# Patient Record
Sex: Male | Born: 1935 | Race: White | Hispanic: No | Marital: Married | Smoking: Former smoker
Health system: Southern US, Community
[De-identification: ages and names within clinical notes are randomized; demographics above are authoritative.]

## PROBLEM LIST (undated history)

## (undated) DIAGNOSIS — I10 Essential (primary) hypertension: Secondary | ICD-10-CM

## (undated) DIAGNOSIS — Z5189 Encounter for other specified aftercare: Secondary | ICD-10-CM

## (undated) DIAGNOSIS — I2510 Atherosclerotic heart disease of native coronary artery without angina pectoris: Secondary | ICD-10-CM

## (undated) DIAGNOSIS — E785 Hyperlipidemia, unspecified: Secondary | ICD-10-CM

## (undated) DIAGNOSIS — K219 Gastro-esophageal reflux disease without esophagitis: Secondary | ICD-10-CM

## (undated) DIAGNOSIS — T39395A Adverse effect of other nonsteroidal anti-inflammatory drugs [NSAID], initial encounter: Secondary | ICD-10-CM

## (undated) DIAGNOSIS — G500 Trigeminal neuralgia: Secondary | ICD-10-CM

## (undated) DIAGNOSIS — I4891 Unspecified atrial fibrillation: Secondary | ICD-10-CM

## (undated) DIAGNOSIS — K259 Gastric ulcer, unspecified as acute or chronic, without hemorrhage or perforation: Secondary | ICD-10-CM

## (undated) DIAGNOSIS — I509 Heart failure, unspecified: Secondary | ICD-10-CM

## (undated) DIAGNOSIS — K922 Gastrointestinal hemorrhage, unspecified: Secondary | ICD-10-CM

## (undated) HISTORY — DX: Heart failure, unspecified: I50.9

## (undated) HISTORY — DX: Trigeminal neuralgia: G50.0

## (undated) HISTORY — DX: Adverse effect of other nonsteroidal anti-inflammatory drugs (NSAID), initial encounter: T39.395A

## (undated) HISTORY — DX: Atherosclerotic heart disease of native coronary artery without angina pectoris: I25.10

## (undated) HISTORY — PX: CHOLECYSTECTOMY: SHX55

## (undated) HISTORY — PX: SHOULDER SURGERY: SHX246

## (undated) HISTORY — DX: Gastro-esophageal reflux disease without esophagitis: K21.9

## (undated) HISTORY — DX: Hyperlipidemia, unspecified: E78.5

## (undated) HISTORY — DX: Unspecified atrial fibrillation: I48.91

## (undated) HISTORY — DX: Essential (primary) hypertension: I10

## (undated) HISTORY — DX: Gastric ulcer, unspecified as acute or chronic, without hemorrhage or perforation: K25.9

---

## 1998-05-23 ENCOUNTER — Observation Stay (HOSPITAL_COMMUNITY): Admission: RE | Admit: 1998-05-23 | Discharge: 1998-05-24 | Payer: Self-pay | Admitting: *Deleted

## 2004-08-22 ENCOUNTER — Ambulatory Visit (HOSPITAL_COMMUNITY): Admission: RE | Admit: 2004-08-22 | Discharge: 2004-08-22 | Payer: Self-pay | Admitting: Orthopedic Surgery

## 2004-08-22 ENCOUNTER — Ambulatory Visit (HOSPITAL_BASED_OUTPATIENT_CLINIC_OR_DEPARTMENT_OTHER): Admission: RE | Admit: 2004-08-22 | Discharge: 2004-08-22 | Payer: Self-pay | Admitting: Orthopedic Surgery

## 2004-08-22 ENCOUNTER — Encounter: Admission: RE | Admit: 2004-08-22 | Discharge: 2004-08-22 | Payer: Self-pay | Admitting: Orthopedic Surgery

## 2006-03-13 ENCOUNTER — Ambulatory Visit: Payer: Self-pay | Admitting: Internal Medicine

## 2006-05-05 ENCOUNTER — Ambulatory Visit: Payer: Self-pay | Admitting: Internal Medicine

## 2006-06-10 ENCOUNTER — Ambulatory Visit: Payer: Self-pay | Admitting: Internal Medicine

## 2006-07-21 ENCOUNTER — Ambulatory Visit: Payer: Self-pay | Admitting: Internal Medicine

## 2006-10-28 ENCOUNTER — Ambulatory Visit: Payer: Self-pay | Admitting: Internal Medicine

## 2007-02-13 ENCOUNTER — Encounter: Admission: RE | Admit: 2007-02-13 | Discharge: 2007-02-13 | Payer: Self-pay | Admitting: Orthopedic Surgery

## 2007-02-13 ENCOUNTER — Ambulatory Visit: Payer: Self-pay | Admitting: Internal Medicine

## 2007-02-13 LAB — CONVERTED CEMR LAB
ALT: 18 units/L (ref 0–40)
AST: 27 units/L (ref 0–37)
Albumin: 3.6 g/dL (ref 3.5–5.2)
Alkaline Phosphatase: 77 units/L (ref 39–117)
BUN: 14 mg/dL (ref 6–23)
Basophils Absolute: 0 10*3/uL (ref 0.0–0.1)
Basophils Relative: 0.4 % (ref 0.0–1.0)
Bilirubin, Direct: 0.2 mg/dL (ref 0.0–0.3)
CO2: 37 meq/L — ABNORMAL HIGH (ref 19–32)
Calcium: 8.3 mg/dL — ABNORMAL LOW (ref 8.4–10.5)
Chloride: 104 meq/L (ref 96–112)
Creatinine, Ser: 1 mg/dL (ref 0.4–1.5)
Eosinophils Absolute: 0.2 10*3/uL (ref 0.0–0.6)
Eosinophils Relative: 2 % (ref 0.0–5.0)
GFR calc Af Amer: 94 mL/min
GFR calc non Af Amer: 78 mL/min
Glucose, Bld: 131 mg/dL — ABNORMAL HIGH (ref 70–99)
HCT: 41.6 % (ref 39.0–52.0)
Hemoglobin: 14.2 g/dL (ref 13.0–17.0)
Lymphocytes Relative: 19.6 % (ref 12.0–46.0)
MCHC: 34.2 g/dL (ref 30.0–36.0)
MCV: 95.2 fL (ref 78.0–100.0)
Monocytes Absolute: 1.1 10*3/uL — ABNORMAL HIGH (ref 0.2–0.7)
Monocytes Relative: 11.8 % — ABNORMAL HIGH (ref 3.0–11.0)
Neutro Abs: 6.4 10*3/uL (ref 1.4–7.7)
Neutrophils Relative %: 66.2 % (ref 43.0–77.0)
PSA: 2.21 ng/mL (ref 0.10–4.00)
Platelets: 177 10*3/uL (ref 150–400)
Potassium: 3.5 meq/L (ref 3.5–5.1)
RBC: 4.37 M/uL (ref 4.22–5.81)
RDW: 13.2 % (ref 11.5–14.6)
Sodium: 146 meq/L — ABNORMAL HIGH (ref 135–145)
Total Bilirubin: 1.4 mg/dL — ABNORMAL HIGH (ref 0.3–1.2)
Total Protein: 6.3 g/dL (ref 6.0–8.3)
WBC: 9.6 10*3/uL (ref 4.5–10.5)

## 2007-02-26 ENCOUNTER — Encounter: Payer: Self-pay | Admitting: Internal Medicine

## 2007-04-24 HISTORY — PX: HIP SURGERY: SHX245

## 2007-05-11 ENCOUNTER — Inpatient Hospital Stay (HOSPITAL_COMMUNITY): Admission: RE | Admit: 2007-05-11 | Discharge: 2007-05-16 | Payer: Self-pay | Admitting: Orthopedic Surgery

## 2007-05-12 ENCOUNTER — Telehealth: Payer: Self-pay | Admitting: Internal Medicine

## 2007-05-21 ENCOUNTER — Ambulatory Visit: Payer: Self-pay | Admitting: Internal Medicine

## 2007-07-16 ENCOUNTER — Ambulatory Visit: Payer: Self-pay | Admitting: Internal Medicine

## 2007-07-21 DIAGNOSIS — I119 Hypertensive heart disease without heart failure: Secondary | ICD-10-CM

## 2007-07-21 DIAGNOSIS — I10 Essential (primary) hypertension: Secondary | ICD-10-CM

## 2007-07-21 DIAGNOSIS — Z8679 Personal history of other diseases of the circulatory system: Secondary | ICD-10-CM

## 2007-07-21 DIAGNOSIS — K219 Gastro-esophageal reflux disease without esophagitis: Secondary | ICD-10-CM

## 2007-07-21 HISTORY — DX: Hypertensive heart disease without heart failure: I11.9

## 2007-07-21 HISTORY — DX: Essential (primary) hypertension: I10

## 2007-07-21 HISTORY — DX: Personal history of other diseases of the circulatory system: Z86.79

## 2007-07-21 HISTORY — DX: Gastro-esophageal reflux disease without esophagitis: K21.9

## 2007-07-23 ENCOUNTER — Ambulatory Visit: Payer: Self-pay | Admitting: Internal Medicine

## 2007-07-23 DIAGNOSIS — I482 Chronic atrial fibrillation, unspecified: Secondary | ICD-10-CM

## 2007-07-23 HISTORY — DX: Chronic atrial fibrillation, unspecified: I48.20

## 2007-10-01 ENCOUNTER — Encounter: Payer: Self-pay | Admitting: Internal Medicine

## 2007-10-09 ENCOUNTER — Telehealth: Payer: Self-pay | Admitting: Internal Medicine

## 2007-10-20 ENCOUNTER — Ambulatory Visit: Payer: Self-pay | Admitting: Internal Medicine

## 2007-10-20 ENCOUNTER — Encounter: Payer: Self-pay | Admitting: Internal Medicine

## 2007-10-23 ENCOUNTER — Ambulatory Visit: Payer: Self-pay | Admitting: Family Medicine

## 2007-10-23 DIAGNOSIS — I2510 Atherosclerotic heart disease of native coronary artery without angina pectoris: Secondary | ICD-10-CM

## 2007-10-23 HISTORY — DX: Atherosclerotic heart disease of native coronary artery without angina pectoris: I25.10

## 2007-11-05 ENCOUNTER — Encounter: Payer: Self-pay | Admitting: Internal Medicine

## 2007-11-13 ENCOUNTER — Telehealth: Payer: Self-pay | Admitting: Internal Medicine

## 2007-12-29 ENCOUNTER — Ambulatory Visit: Payer: Self-pay | Admitting: Internal Medicine

## 2008-02-08 ENCOUNTER — Telehealth: Payer: Self-pay | Admitting: Internal Medicine

## 2008-02-22 HISTORY — PX: TOTAL KNEE ARTHROPLASTY: SHX125

## 2008-03-10 ENCOUNTER — Telehealth: Payer: Self-pay | Admitting: Internal Medicine

## 2008-03-14 ENCOUNTER — Inpatient Hospital Stay (HOSPITAL_COMMUNITY): Admission: RE | Admit: 2008-03-14 | Discharge: 2008-03-17 | Payer: Self-pay | Admitting: Orthopedic Surgery

## 2008-03-14 ENCOUNTER — Ambulatory Visit: Payer: Self-pay | Admitting: Internal Medicine

## 2008-04-13 ENCOUNTER — Telehealth (INDEPENDENT_AMBULATORY_CARE_PROVIDER_SITE_OTHER): Payer: Self-pay

## 2008-04-22 ENCOUNTER — Ambulatory Visit: Payer: Self-pay | Admitting: Internal Medicine

## 2008-04-26 ENCOUNTER — Ambulatory Visit: Payer: Self-pay | Admitting: Internal Medicine

## 2008-07-05 ENCOUNTER — Ambulatory Visit: Payer: Self-pay | Admitting: Internal Medicine

## 2008-08-17 ENCOUNTER — Telehealth: Payer: Self-pay | Admitting: Internal Medicine

## 2008-09-02 ENCOUNTER — Telehealth: Payer: Self-pay | Admitting: Internal Medicine

## 2008-11-01 IMAGING — CR DG CHEST 2V
2 series · 2 of 2 positions shown · non-contrast
Comparison: 08/22/04.

CLINICAL DATA: Osteoarthritis of right hip.  Preoperative chest for total hip replacement.
 CHEST - 2 VIEW - 05/05/07:

[view not recorded (1 of 2)]
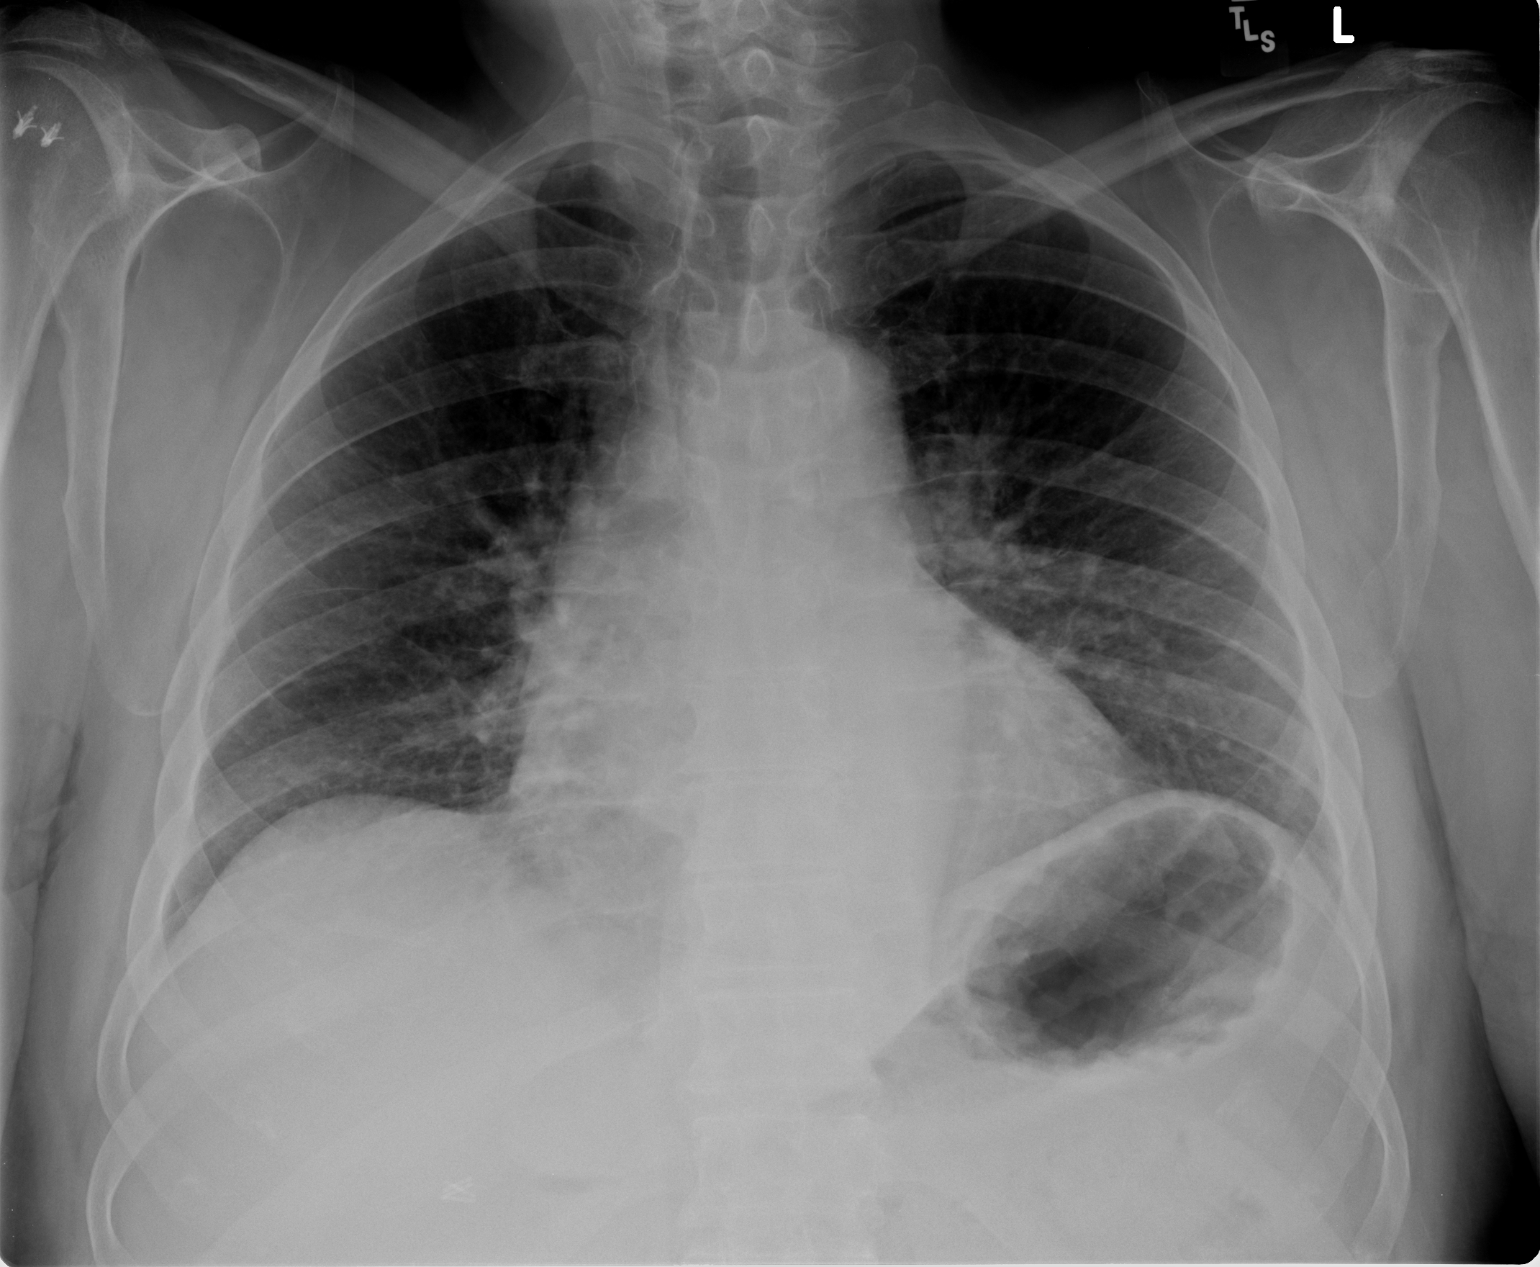

[view not recorded (2 of 2)]
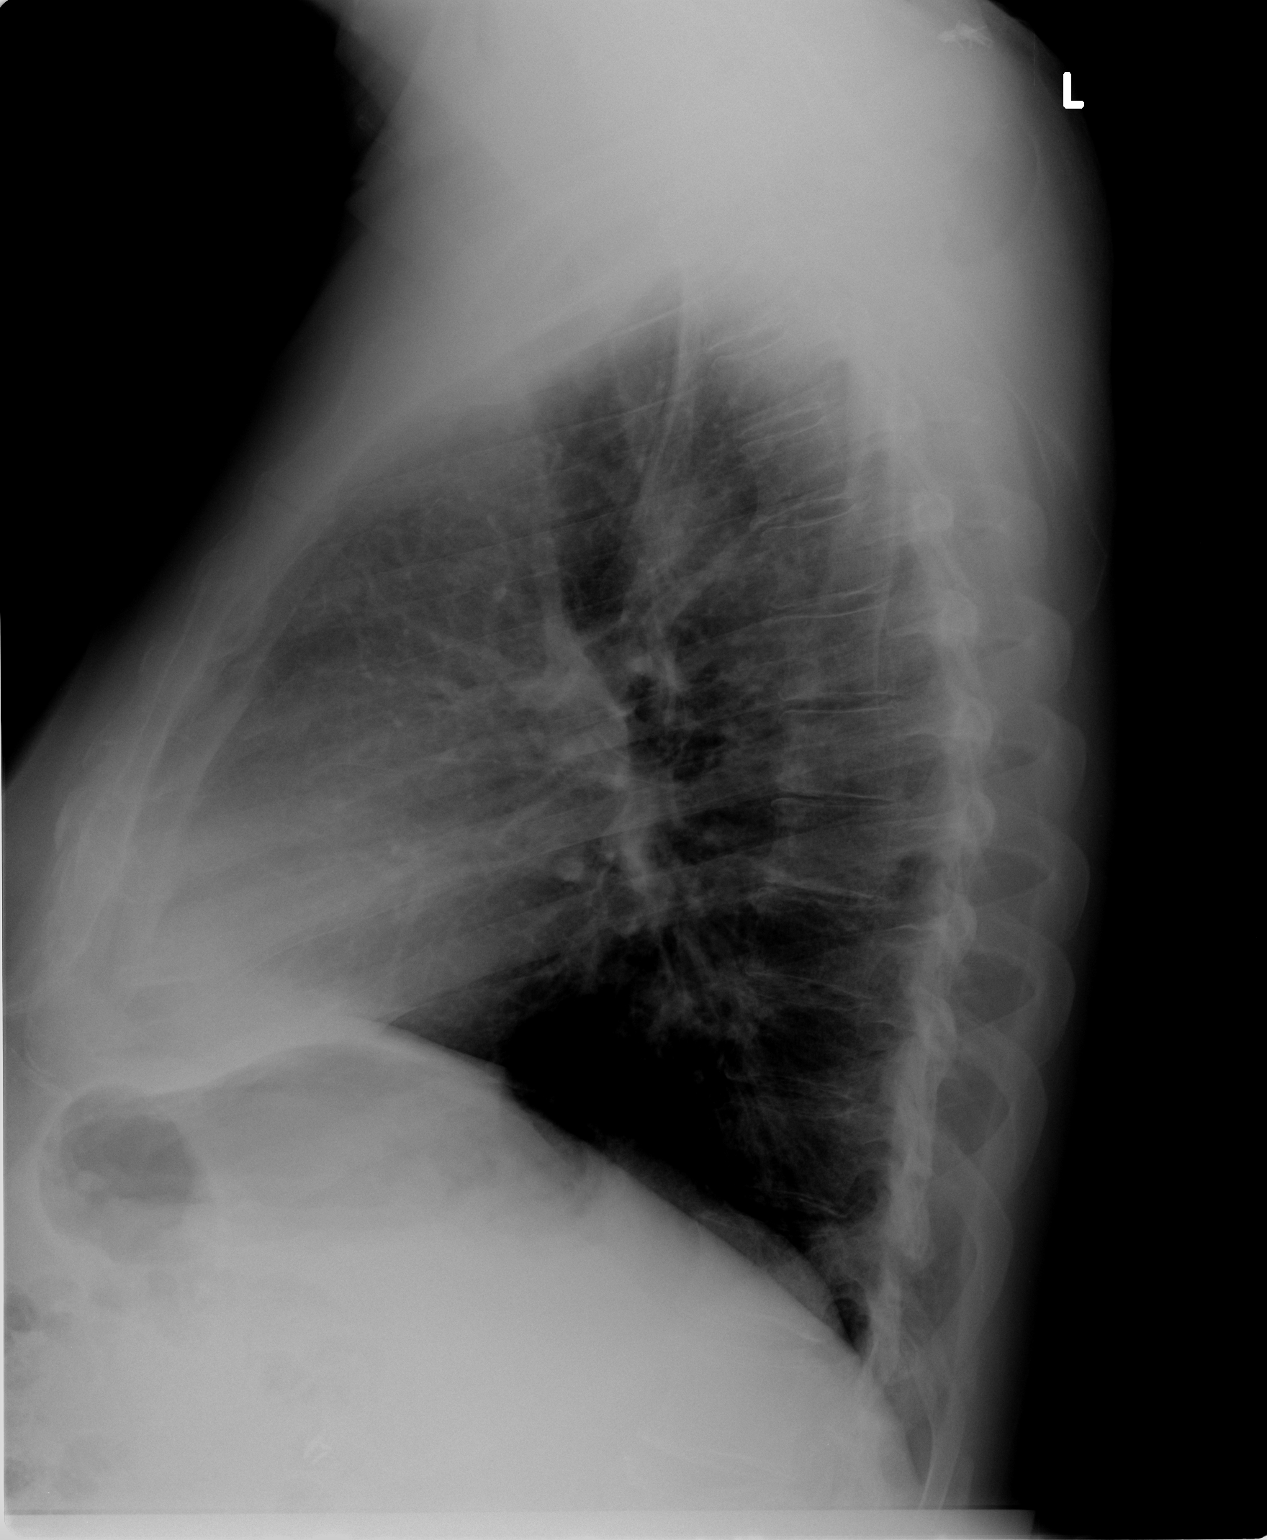

[2 of 2 positions shown; findings below may reference images not displayed]

FINDINGS: There are mild chronic bronchitic changes.  The heart is upper normal in size.  There are no mediastinal or hilar abnormalities and there are no infiltrative or edematous changes.
IMPRESSION: Stable chronic bronchitic changes.  No acute findings.

## 2008-11-11 ENCOUNTER — Telehealth: Payer: Self-pay | Admitting: Internal Medicine

## 2008-11-14 ENCOUNTER — Ambulatory Visit: Payer: Self-pay | Admitting: Internal Medicine

## 2008-12-09 ENCOUNTER — Telehealth: Payer: Self-pay | Admitting: Family Medicine

## 2008-12-20 ENCOUNTER — Telehealth: Payer: Self-pay | Admitting: Internal Medicine

## 2009-03-03 ENCOUNTER — Telehealth: Payer: Self-pay | Admitting: Internal Medicine

## 2009-03-06 ENCOUNTER — Telehealth: Payer: Self-pay | Admitting: Internal Medicine

## 2009-03-09 ENCOUNTER — Telehealth: Payer: Self-pay | Admitting: Internal Medicine

## 2009-04-13 ENCOUNTER — Telehealth: Payer: Self-pay | Admitting: Internal Medicine

## 2009-05-12 ENCOUNTER — Telehealth: Payer: Self-pay | Admitting: Internal Medicine

## 2009-06-15 ENCOUNTER — Ambulatory Visit: Payer: Self-pay | Admitting: Internal Medicine

## 2009-08-04 ENCOUNTER — Encounter (INDEPENDENT_AMBULATORY_CARE_PROVIDER_SITE_OTHER): Payer: Self-pay | Admitting: *Deleted

## 2009-09-04 ENCOUNTER — Encounter (INDEPENDENT_AMBULATORY_CARE_PROVIDER_SITE_OTHER): Payer: Self-pay | Admitting: *Deleted

## 2009-09-23 HISTORY — PX: ESOPHAGOGASTRODUODENOSCOPY: SHX1529

## 2009-09-25 ENCOUNTER — Telehealth: Payer: Self-pay | Admitting: Internal Medicine

## 2009-09-25 ENCOUNTER — Ambulatory Visit: Payer: Self-pay | Admitting: Internal Medicine

## 2009-10-09 ENCOUNTER — Ambulatory Visit: Payer: Self-pay | Admitting: Internal Medicine

## 2009-10-09 LAB — CONVERTED CEMR LAB
Basophils Absolute: 0 10*3/uL (ref 0.0–0.1)
Basophils Relative: 0.5 % (ref 0.0–3.0)
Eosinophils Absolute: 0.1 10*3/uL (ref 0.0–0.7)
Eosinophils Relative: 1.4 % (ref 0.0–5.0)
HCT: 43.9 % (ref 39.0–52.0)
Hemoglobin: 14.1 g/dL (ref 13.0–17.0)
Lymphocytes Relative: 16.9 % (ref 12.0–46.0)
Lymphs Abs: 1.5 10*3/uL (ref 0.7–4.0)
MCHC: 32 g/dL (ref 30.0–36.0)
MCV: 98 fL (ref 78.0–100.0)
Monocytes Absolute: 0.8 10*3/uL (ref 0.1–1.0)
Monocytes Relative: 8.9 % (ref 3.0–12.0)
Neutro Abs: 6.2 10*3/uL (ref 1.4–7.7)
Neutrophils Relative %: 72.3 % (ref 43.0–77.0)
Platelets: 170 10*3/uL (ref 150.0–400.0)
RBC: 4.48 M/uL (ref 4.22–5.81)
RDW: 14.5 % (ref 11.5–14.6)
WBC: 8.6 10*3/uL (ref 4.5–10.5)

## 2009-11-09 ENCOUNTER — Encounter: Payer: Self-pay | Admitting: Internal Medicine

## 2009-11-30 ENCOUNTER — Telehealth: Payer: Self-pay

## 2009-12-08 ENCOUNTER — Encounter: Payer: Self-pay | Admitting: Internal Medicine

## 2009-12-11 ENCOUNTER — Telehealth: Payer: Self-pay

## 2009-12-14 ENCOUNTER — Telehealth: Payer: Self-pay

## 2009-12-20 ENCOUNTER — Encounter: Payer: Self-pay | Admitting: Internal Medicine

## 2009-12-25 ENCOUNTER — Telehealth: Payer: Self-pay

## 2010-01-02 ENCOUNTER — Telehealth: Payer: Self-pay

## 2010-01-08 ENCOUNTER — Ambulatory Visit: Payer: Self-pay | Admitting: Internal Medicine

## 2010-02-07 ENCOUNTER — Encounter: Payer: Self-pay | Admitting: Internal Medicine

## 2010-03-01 ENCOUNTER — Telehealth: Payer: Self-pay

## 2010-03-12 ENCOUNTER — Encounter: Payer: Self-pay | Admitting: Internal Medicine

## 2010-03-20 ENCOUNTER — Ambulatory Visit: Payer: Self-pay | Admitting: Internal Medicine

## 2010-03-20 DIAGNOSIS — R079 Chest pain, unspecified: Secondary | ICD-10-CM

## 2010-03-20 HISTORY — DX: Chest pain, unspecified: R07.9

## 2010-04-09 ENCOUNTER — Encounter: Payer: Self-pay | Admitting: Internal Medicine

## 2010-04-18 ENCOUNTER — Telehealth: Payer: Self-pay

## 2010-04-19 ENCOUNTER — Encounter: Payer: Self-pay | Admitting: Internal Medicine

## 2010-04-23 LAB — HM COLONOSCOPY

## 2010-04-25 ENCOUNTER — Telehealth: Payer: Self-pay | Admitting: Internal Medicine

## 2010-05-09 ENCOUNTER — Encounter: Payer: Self-pay | Admitting: Internal Medicine

## 2010-05-11 ENCOUNTER — Ambulatory Visit: Payer: Self-pay | Admitting: Internal Medicine

## 2010-05-11 DIAGNOSIS — I509 Heart failure, unspecified: Secondary | ICD-10-CM

## 2010-05-11 HISTORY — DX: Heart failure, unspecified: I50.9

## 2010-05-15 ENCOUNTER — Ambulatory Visit: Payer: Self-pay | Admitting: Internal Medicine

## 2010-05-15 DIAGNOSIS — I951 Orthostatic hypotension: Secondary | ICD-10-CM | POA: Insufficient documentation

## 2010-05-15 HISTORY — DX: Orthostatic hypotension: I95.1

## 2010-05-16 ENCOUNTER — Telehealth: Payer: Self-pay | Admitting: Internal Medicine

## 2010-05-16 ENCOUNTER — Inpatient Hospital Stay (HOSPITAL_COMMUNITY): Admission: EM | Admit: 2010-05-16 | Discharge: 2010-05-19 | Payer: Self-pay | Admitting: Emergency Medicine

## 2010-05-16 ENCOUNTER — Ambulatory Visit: Payer: Self-pay | Admitting: Cardiovascular Disease

## 2010-05-17 ENCOUNTER — Encounter (INDEPENDENT_AMBULATORY_CARE_PROVIDER_SITE_OTHER): Payer: Self-pay | Admitting: Internal Medicine

## 2010-05-21 ENCOUNTER — Telehealth: Payer: Self-pay | Admitting: Internal Medicine

## 2010-05-22 ENCOUNTER — Ambulatory Visit: Payer: Self-pay | Admitting: Internal Medicine

## 2010-05-24 ENCOUNTER — Encounter: Payer: Self-pay | Admitting: Internal Medicine

## 2010-05-29 ENCOUNTER — Encounter: Payer: Self-pay | Admitting: Internal Medicine

## 2010-05-31 ENCOUNTER — Encounter: Payer: Self-pay | Admitting: Internal Medicine

## 2010-06-04 ENCOUNTER — Encounter: Payer: Self-pay | Admitting: Internal Medicine

## 2010-06-05 ENCOUNTER — Telehealth: Payer: Self-pay | Admitting: Internal Medicine

## 2010-06-07 ENCOUNTER — Encounter: Payer: Self-pay | Admitting: Internal Medicine

## 2010-06-08 ENCOUNTER — Ambulatory Visit: Payer: Self-pay | Admitting: Internal Medicine

## 2010-06-08 LAB — CONVERTED CEMR LAB
Basophils Absolute: 0 10*3/uL (ref 0.0–0.1)
Basophils Relative: 0 % (ref 0–1)
Eosinophils Absolute: 0.1 10*3/uL (ref 0.0–0.7)
Eosinophils Relative: 2 % (ref 0–5)
HCT: 37.7 % — ABNORMAL LOW (ref 39.0–52.0)
Hemoglobin: 12.4 g/dL — ABNORMAL LOW (ref 13.0–17.0)
Lymphocytes Relative: 24 % (ref 12–46)
Lymphs Abs: 1.8 10*3/uL (ref 0.7–4.0)
MCHC: 32.9 g/dL (ref 30.0–36.0)
MCV: 95 fL (ref 78.0–100.0)
Monocytes Absolute: 0.8 10*3/uL (ref 0.1–1.0)
Monocytes Relative: 11 % (ref 3–12)
Neutro Abs: 4.6 10*3/uL (ref 1.7–7.7)
Neutrophils Relative %: 63 % (ref 43–77)
Platelets: 292 10*3/uL (ref 150–400)
RBC: 3.97 M/uL — ABNORMAL LOW (ref 4.22–5.81)
RDW: 14.7 % (ref 11.5–15.5)
WBC: 7.3 10*3/uL (ref 4.0–10.5)

## 2010-06-15 ENCOUNTER — Telehealth: Payer: Self-pay | Admitting: Internal Medicine

## 2010-06-25 ENCOUNTER — Telehealth: Payer: Self-pay | Admitting: Internal Medicine

## 2010-07-03 ENCOUNTER — Telehealth: Payer: Self-pay

## 2010-07-05 ENCOUNTER — Encounter: Admission: RE | Admit: 2010-07-05 | Discharge: 2010-07-05 | Payer: Self-pay | Admitting: Gastroenterology

## 2010-07-10 ENCOUNTER — Encounter: Payer: Self-pay | Admitting: Internal Medicine

## 2010-07-17 ENCOUNTER — Ambulatory Visit: Payer: Self-pay | Admitting: Internal Medicine

## 2010-08-06 ENCOUNTER — Encounter: Payer: Self-pay | Admitting: Internal Medicine

## 2010-08-06 ENCOUNTER — Telehealth: Payer: Self-pay | Admitting: Internal Medicine

## 2010-08-08 ENCOUNTER — Telehealth (INDEPENDENT_AMBULATORY_CARE_PROVIDER_SITE_OTHER): Payer: Self-pay | Admitting: *Deleted

## 2010-08-10 ENCOUNTER — Telehealth: Payer: Self-pay | Admitting: Internal Medicine

## 2010-08-10 ENCOUNTER — Telehealth: Payer: Self-pay

## 2010-08-24 ENCOUNTER — Telehealth: Payer: Self-pay | Admitting: Internal Medicine

## 2010-09-04 ENCOUNTER — Telehealth: Payer: Self-pay

## 2010-09-06 ENCOUNTER — Encounter: Payer: Self-pay | Admitting: Internal Medicine

## 2010-09-10 ENCOUNTER — Encounter: Payer: Self-pay | Admitting: Internal Medicine

## 2010-09-11 ENCOUNTER — Telehealth: Payer: Self-pay | Admitting: Internal Medicine

## 2010-09-12 ENCOUNTER — Telehealth (INDEPENDENT_AMBULATORY_CARE_PROVIDER_SITE_OTHER): Payer: Self-pay | Admitting: *Deleted

## 2010-09-13 ENCOUNTER — Ambulatory Visit: Payer: Self-pay | Admitting: Internal Medicine

## 2010-09-27 ENCOUNTER — Ambulatory Visit
Admission: RE | Admit: 2010-09-27 | Discharge: 2010-09-27 | Payer: Self-pay | Source: Home / Self Care | Attending: Internal Medicine | Admitting: Internal Medicine

## 2010-09-27 ENCOUNTER — Ambulatory Visit: Admit: 2010-09-27 | Payer: Self-pay | Admitting: Internal Medicine

## 2010-10-02 ENCOUNTER — Telehealth: Payer: Self-pay | Admitting: Internal Medicine

## 2010-10-11 ENCOUNTER — Encounter: Payer: Self-pay | Admitting: Internal Medicine

## 2010-10-17 ENCOUNTER — Telehealth: Payer: Self-pay | Admitting: Internal Medicine

## 2010-10-22 ENCOUNTER — Ambulatory Visit
Admission: RE | Admit: 2010-10-22 | Discharge: 2010-10-22 | Payer: Self-pay | Source: Home / Self Care | Attending: Internal Medicine | Admitting: Internal Medicine

## 2010-10-25 NOTE — Assessment & Plan Note (Signed)
Summary: 3 MONTH ROV/NJR   Vital Signs:  Patient profile:   77 year old male Weight:      215 pounds Temp:     98.0 degrees F oral BP sitting:   110 / 70  (left arm) Cuff size:   regular  Vitals Entered By: Duard Brady LPN (January 08, 2010 3:53 PM) And aCC: 3 mos rov  - needs to check pt/inr , doing well Is Patient Diabetic? No   CC:  3 mos rov  - needs to check pt/inr  and doing well.  History of Present Illness: 77 year old patient who is seen today for follow-up;  he has a history of permanent atrial fibrillation, coronary artery disease, hypertension, and dyslipidemia.   he is doing quite well and followed by cardiology in  [place].  He states that last week when he first got out of bed, his right leg gave away and he fell back into the bed.  Approximately 1 or two minutes later he was back to baseline.  He denied any numbness or tingling associated with the apparent fleeing right leg weakness.  He has had no recurrent symptoms.  He has done well on Lipitor for dyslipidemia.  He also has a history of osteoarthritis controlled with Celebrex.  Allergies: 1)  ! Sulfa 2)  ! Codeine  Past History:  Past Medical History: Reviewed history from 10/09/2009 and no changes required. Coronary artery disease GERD Hyperlipidemia Hypertension Atrial fibrillation trigeminal neuralgia  Review of Systems  The patient denies anorexia, fever, weight loss, weight gain, vision loss, decreased hearing, hoarseness, chest pain, syncope, dyspnea on exertion, peripheral edema, prolonged cough, headaches, hemoptysis, abdominal pain, melena, hematochezia, severe indigestion/heartburn, hematuria, incontinence, genital sores, muscle weakness, suspicious skin lesions, transient blindness, difficulty walking, depression, unusual weight change, abnormal bleeding, enlarged lymph nodes, angioedema, breast masses, and testicular masses.    Physical Exam  General:  overweight-appearing.   100/70overweight-appearing.   Head:  Normocephalic and atraumatic without obvious abnormalities. No apparent alopecia or balding. Eyes:  No corneal or conjunctival inflammation noted. EOMI. Perrla. Funduscopic exam benign, without hemorrhages, exudates or papilledema. Vision grossly normal. Mouth:  Oral mucosa and oropharynx without lesions or exudates.    Neck:  no bruits Lungs:  bibasilar rales, right greater than the left Heart:   irregular rhythm with controlled ventricular response Abdomen:  Bowel sounds positive,abdomen soft and non-tender without masses, organomegaly or hernias noted. Msk:  No deformity or scoliosis noted of thoracic or lumbar spine.   Pulses:  pedal pulses intact Neurologic:  no lower extremity weakness Skin:  Intact without suspicious lesions or rashes Cervical Nodes:  No lymphadenopathy noted   Impression & Recommendations:  Problem # 1:  HYPERTENSION (ICD-401.9)  His updated medication list for this problem includes:    Furosemide 40 Mg Tabs (Furosemide) .Marland Kitchen... 1 by mouth once daily    Atenolol 25 Mg Tabs (Atenolol) .Marland Kitchen... 1 once daily  His updated medication list for this problem includes:    Furosemide 40 Mg Tabs (Furosemide) .Marland Kitchen... 1 by mouth once daily    Atenolol 25 Mg Tabs (Atenolol) .Marland Kitchen... 1 once daily  Problem # 2:  HYPERLIPIDEMIA (ICD-272.4)  His updated medication list for this problem includes:    Lipitor 40 Mg Tabs (Atorvastatin calcium) .Marland Kitchen... 1 once daily  His updated medication list for this problem includes:    Lipitor 40 Mg Tabs (Atorvastatin calcium) .Marland Kitchen... 1 once daily  Problem # 3:  CORONARY ARTERY DISEASE (ICD-414.00)  His updated medication list  for this problem includes:    Furosemide 40 Mg Tabs (Furosemide) .Marland Kitchen... 1 by mouth once daily    Atenolol 25 Mg Tabs (Atenolol) .Marland Kitchen... 1 once daily  His updated medication list for this problem includes:    Furosemide 40 Mg Tabs (Furosemide) .Marland Kitchen... 1 by mouth once daily    Atenolol 25 Mg  Tabs (Atenolol) .Marland Kitchen... 1 once daily  Problem # 4:  ATRIAL FIBRILLATION (ICD-427.31)  His updated medication list for this problem includes:    Digoxin 0.125 Mg Tabs (Digoxin) .Marland Kitchen... 1 once daily    Coumadin 4 Mg Tabs (Warfarin sodium) .Marland Kitchen... 2 mg every tuesday, and friday, and 4 mg 5 times per week    Atenolol 25 Mg Tabs (Atenolol) .Marland Kitchen... 1 once daily  His updated medication list for this problem includes:    Digoxin 0.125 Mg Tabs (Digoxin) .Marland Kitchen... 1 once daily    Coumadin 4 Mg Tabs (Warfarin sodium) .Marland Kitchen... 2 mg every tuesday, and friday, and 4 mg 5 times per week    Atenolol 25 Mg Tabs (Atenolol) .Marland Kitchen... 1 once daily  Orders: Protime ([id number])  Problem # 5:  CEREBROVASCULAR ACCIDENT, HX OF (ICD-V12.50)  Complete Medication List: 1)  Celebrex 200 Mg Caps (Celecoxib) .... Once daily 2)  Lipitor 40 Mg Tabs (Atorvastatin calcium) .Marland Kitchen.. 1 once daily 3)  Digoxin 0.125 Mg Tabs (Digoxin) .Marland Kitchen.. 1 once daily 4)  Coumadin 4 Mg Tabs (Warfarin sodium) .... 2 mg every tuesday, and friday, and 4 mg 5 times per week 5)  Nexium 40 Mg Cpdr (Esomeprazole magnesium) .Marland Kitchen.. 1 once daily 6)  Ambien 5 Mg Tabs (Zolpidem tartrate) .... One by mouth q hs for insomnia 7)  Furosemide 40 Mg Tabs (Furosemide) .Marland Kitchen.. 1 by mouth once daily 8)  Anusol-hc 2.5 % Crea (Hydrocortisone) .... Use four times a day 9)  Colace 100 Mg Caps (Docusate sodium) .... One two times a day. 10)  Atenolol 25 Mg Tabs (Atenolol) .Marland Kitchen.. 1 once daily 11)  Carbamazepine 200 Mg Tabs (Carbamazepine) .... One half tablet  twice daily 12)  Durezol 0.05 % Emul (Difluprednate) .... Both eyes 1-2 times qd 13)  Lumigan 0.01 % Soln (Bimatoprost) .... Both eyes qhs 14)  Calcium-vitamin D 600-200 Mg-unit Tabs (Calcium-vitamin d) .... Qd  Patient Instructions: 1)  Please schedule a follow-up appointment in 3 months. 2)  Limit your Sodium (Salt). 3)  It is important that you exercise regularly at least 20 minutes 5 times a week. If you develop chest pain, have  severe difficulty breathing, or feel very tired , stop exercising immediately and seek medical attention. 4)  You need to lose weight. Consider a lower calorie diet and regular exercise.  Prescriptions: ATENOLOL 25 MG TABS (ATENOLOL) 1 once daily  #0 x 0   Entered and Authorized by:   [name]  MD   Signed by:   [name]  MD on 01/08/2010   Method used:   Print then Give to Patient   RxID:   [id number] FUROSEMIDE 40 MG TABS (FUROSEMIDE) 1 by mouth once daily  #30 x 3   Entered and Authorized by:   [name]  MD   Signed by:   [name]  MD on 01/08/2010   Method used:   Print then Give to Patient   RxID:   [id number] AMBIEN 5 MG TABS (ZOLPIDEM TARTRATE) one by mouth q hs for insomnia  #30 x 0   Entered and Authorized by:   Theron Arista  Lysle Dingwall  MD   Signed by:   [name]  MD on 01/08/2010   Method used:   Print then Give to Patient   RxID:   [id number] NEXIUM 40 MG  CPDR (ESOMEPRAZOLE MAGNESIUM) 1 once daily  #100 x 6   Entered and Authorized by:   [name]  MD   Signed by:   [name]  MD on 01/08/2010   Method used:   Print then Give to Patient   RxID:   [id number] COUMADIN 4 MG  TABS (WARFARIN SODIUM) 2 mg every Tuesday, and Friday, and 4 mg 5 times per week  #90 x 0   Entered and Authorized by:   [name]  MD   Signed by:   [name]  MD on 01/08/2010   Method used:   Print then Give to Patient   RxID:   [id number] DIGOXIN 0.125 MG  TABS (DIGOXIN) 1 once daily  #100 x 6   Entered and Authorized by:   [name]  MD   Signed by:   [name]  MD on 01/08/2010   Method used:   Print then Give to Patient   RxID:   [id number] LIPITOR 40 MG  TABS (ATORVASTATIN CALCIUM) 1 once daily  #100 x 6   Entered and Authorized by:   [name]  MD   Signed by:   [name]  MD on 01/08/2010   Method used:   Print then Give to  Patient   RxID:   [id number] CELEBREX 200 MG  CAPS (CELECOXIB) once daily  #100 x 6   Entered and Authorized by:   [name]  MD   Signed by:   [name]  MD on 01/08/2010   Method used:   Print then Give to Patient   RxID:   [id number]   Appended Document: 3 MONTH ROV/NJR   ANTICOAGULATION RECORD PREVIOUS REGIMEN & LAB RESULTS   Previous INR:  2.0 on  10/09/2009  Previous Regimen:  same on  10/09/2009  NEW REGIMEN & LAB RESULTS Anticoag. Dx: Atrial fibrillation Current INR Goal Range: 2.0-3.0 Current Coumadin Dose(mg): 2mg . Mon. & Wed. all others 4mg . Regimen: 4mg . all days this week then resume normal dose  Repeat testing in: 1 month  Anticoagulation Visit Questionnaire Coumadin dose missed/changed:  No Abnormal Bleeding Symptoms:  No  Any diet changes including alcohol intake, vegetables or greens since the last visit:  No Any illnesses or hospitalizations since the last visit:  No Any signs of clotting since the last visit (including chest discomfort, dizziness, shortness of breath, arm tingling, slurred speech, swelling or redness in leg):  No  MEDICATIONS CELEBREX 200 MG  CAPS (CELECOXIB) once daily LIPITOR 40 MG  TABS (ATORVASTATIN CALCIUM) 1 once daily DIGOXIN 0.125 MG  TABS (DIGOXIN) 1 once daily COUMADIN 4 MG  TABS (WARFARIN SODIUM) 2 mg every Tuesday, and Friday, and 4 mg 5 times per week NEXIUM 40 MG  CPDR (ESOMEPRAZOLE MAGNESIUM) 1 once daily AMBIEN 5 MG TABS (ZOLPIDEM TARTRATE) one by mouth q hs for insomnia FUROSEMIDE 40 MG TABS (FUROSEMIDE) 1 by mouth once daily ANUSOL-HC 2.5 % CREA (HYDROCORTISONE) Use four times a day COLACE 100 MG CAPS (DOCUSATE SODIUM) One two times a day. ATENOLOL 25 MG TABS (ATENOLOL) 1 once daily CARBAMAZEPINE 200 MG TABS (CARBAMAZEPINE) one half tablet  twice daily DUREZOL 0.05 % EMUL (DIFLUPREDNATE) both eyes 1-2 times qd LUMIGAN  0.01 % SOLN (BIMATOPROST) both eyes qhs CALCIUM-VITAMIN D 600-200  MG-UNIT TABS (CALCIUM-VITAMIN D) qd

## 2010-10-25 NOTE — Progress Notes (Signed)
Summary: coumadin ?LM 3-25  Phone Note From Other Clinic Call back at [facility] Phone [phone]   Caller: vm Summary of Call: cALLING ABOUT COUMADIN & WHAT HE'S SUPPOSED TO DO ABOUT IT.  Never stopped coumadin.  The hand surgeon went ahead & did the surgery because my coumadin was low.  Do I need to up my coumadin dose?  Current dose is 4mg  daily.  Next PT is 4-18.    Initial call taken by: Rudy Jew, RN,  December 14, 2009 12:08 PM  Follow-up for Phone Call        recheck INR 1 week Follow-up by: [name]  MD,  December 14, 2009 12:36 PM  Additional Follow-up for Phone Call Additional follow up Details #1::        Left mesage to inform. Rudy Jew, RN  December 15, 2009 10:30 AM Left message again. Rudy Jew, RN  December 15, 2009 3:25 PM   Additional Follow-up by: [name]  MD,  December 15, 2009 10:55 AM

## 2010-10-25 NOTE — Progress Notes (Signed)
Summary: coumadin pills  Phone Note Call from Patient   Caller: Patient Call For: [name]  MD Summary of Call: Pt needs pres. for Coumadin (generic) 7.5 mg. M, W, F. to [place] , [place]  Initial call taken by: Lynann Beaver CMA AAMA,  October 17, 2010 12:43 PM  Follow-up for Phone Call        spoke with pt - explained that he could use 1 and 1/2 of the 5mg  to make 7.5mg  - he didnt nned additional rx for 7.5mg    KIK Follow-up by: Duard Brady LPN,  October 17, 2010 1:07 PM

## 2010-10-25 NOTE — Progress Notes (Signed)
Summary: REQUEST FOR APPT / RX  Phone Note Call from Patient   Caller: Patient   [phone] Summary of Call: Exp sxs of cough, congestion, sneezing, runny nose, sinus pressure.... denies fever.Marland KitchenMarland KitchenMarland KitchenMarland Kitchen would come in for appt today or tomorrow if he can be worked in with Dr Amador Cunas.... If not, please sendl Rx into  [facility] [place].  Initial call taken by: Debbra Riding,  September 11, 2010 3:37 PM  Follow-up for Phone Call        continue tylenol; suggest mucinex DM use two times a day; ROV if worsens Follow-up by: [name]  MD,  September 11, 2010 5:19 PM  Additional Follow-up for Phone Call Additional follow up Details #1::        Pt called and says that he is still sick and is wanting work in acute this a.m. with Dr Kirtland Bouchard.  Additional Follow-up by: Lucy Antigua,  September 12, 2010 9:18 AM    Additional Follow-up for Phone Call Additional follow up Details #2::    attempt to call - to offer appt tomorrow - no ans no mach - KIK Follow-up by: Duard Brady LPN,  September 12, 2010 10:09 AM  Additional Follow-up for Phone Call Additional follow up Details #3:: Details for Additional Follow-up Action Taken: Pt called back and was adv of prior notation.... Pt states that he has been waiting by the phone and no one has called?Marland Kitchen... Pt was scheduled for appt tomorrow, 12/22 at 3:45pm  with Dr Kirtland Bouchard.  Additional Follow-up by: Debbra Riding,  September 12, 2010 10:38 AM

## 2010-10-25 NOTE — Progress Notes (Signed)
Summary: labs  Phone Note Call from Patient Call back at Home Phone [phone]   Caller: Patient Call For: [name]  MD Summary of Call: Pt is asking to speak to [facility] about his lab results. Initial call taken by: Lynann Beaver CMA,  June 05, 2010 12:56 PM  Follow-up for Phone Call        called pt - he would like to know the 'numbers' - he was tols to record them . I told him I would call once Dr. Amador Cunas reviews. KIK Follow-up by: Duard Brady LPN,  June 05, 2010 3:17 PM     Appended Document: labs attempt to call - ans mach - LMTCB before 12noon - need to know if on any coumadin at this time and the dose. KIK

## 2010-10-25 NOTE — Assessment & Plan Note (Signed)
Summary: dizziness ok per doc/appt 3pm/njr   Vital Signs:  Patient profile:   77 year old male Weight:      208 pounds Temp:     98.0 degrees F oral BP sitting:   100 / 68  (right arm) Cuff size:   regular  Vitals Entered By: Duard Brady LPN (May 11, 2010 3:21 PM) CC: c/o dizziness and lightheaded - was dc'd from [facility] cnt sat -CHF? Is Patient Diabetic? No   CC:  c/o dizziness and lightheaded - was dc'd from [facility] cnt sat -CHF?Eric [name]  History of Present Illness: 77 year old patient who is seen today for a hospital discharg in [place]. he was admitted apparently for decompensated heart failure.  Since his discharge he has had some orthostatic dizziness.  He was discharged on his same preadmission medications with the addition of isosorbide dinitrate 30 mg daily.  He has had no peripheral edema.  He does have some mild dyspnea on exertion.  He has had no chest pain.  He has had no nitroglycerin use.  Apparently, his Coumadin was titrated during this period  Allergies: 1)  ! Sulfa 2)  ! Codeine  Past History:  Past Medical History: Coronary artery disease GERD Hyperlipidemia Hypertension Atrial fibrillation trigeminal neuralgia history of congestive heart failure Congestive heart failure  Past Surgical History: Reviewed history from 04/22/2008 and no changes required. L Knee Surgery L Shoulder Surgery Cholecystectomy status post right total hip replacement surgery  August 2008 colonoscopy in  2002 status post left knee replacement surgery June 2009  Review of Systems  The patient denies anorexia, fever, weight loss, weight gain, vision loss, decreased hearing, hoarseness, chest pain, syncope, dyspnea on exertion, peripheral edema, prolonged cough, headaches, hemoptysis, abdominal pain, melena, hematochezia, severe indigestion/heartburn, hematuria, incontinence, genital sores, muscle weakness, suspicious skin lesions, transient blindness,  difficulty walking, depression, unusual weight change, abnormal bleeding, enlarged lymph nodes, angioedema, breast masses, and testicular masses.    Physical Exam  General:  overweight-appearing.  no distress.  Blood pressure 100/72 standing from a sitting position aggravated his dizzinessoverweight-appearing.   Head:  Normocephalic and atraumatic without obvious abnormalities. No apparent alopecia or balding. Mouth:  Oral mucosa and oropharynx without lesions or exudates.  Teeth in good repair. Neck:  No deformities, masses, or tenderness noted. Lungs:  Normal respiratory effort, chest expands symmetrically. Lungs are clear to auscultation, no crackles or wheezes. O2 saturation 97% Heart:  controlled ventricular response Abdomen:  Bowel sounds positive,abdomen soft and non-tender without masses, organomegaly or hernias noted. Msk:  No deformity or scoliosis noted of thoracic or lumbar spine.   Extremities:  no edema   Impression & Recommendations:  Problem # 1:  CONGESTIVE HEART FAILURE (ICD-428.0)  His updated medication list for this problem includes:    Digoxin 0.125 Mg Tabs (Digoxin) .Eric [name]... 1 once daily    Coumadin 4 Mg Tabs (Warfarin sodium) .Eric [name]... 2 mg every tuesday, and friday, and 4 mg 5 times per week    Furosemide 40 Mg Tabs (Furosemide) .Eric [name]... 1 by mouth once daily    Atenolol 25 Mg Tabs (Atenolol) .Eric [name]... 1 once daily  His updated medication list for this problem includes:    Digoxin 0.125 Mg Tabs (Digoxin) .Eric [name]... 1 once daily    Coumadin 4 Mg Tabs (Warfarin sodium) .Eric [name]... 2 mg every tuesday, and friday, and 4 mg 5 times per week    Furosemide 40 Mg Tabs (Furosemide) .Eric [name]... 1 by mouth once daily    Atenolol 25 Mg Tabs (  Atenolol) .Eric [name]... 1 once daily  Problem # 2:  HYPERTENSION (ICD-401.9)  His updated medication list for this problem includes:    Furosemide 40 Mg Tabs (Furosemide) .Eric [name]... 1 by mouth once daily    Atenolol 25 Mg Tabs (Atenolol) .Eric [name]... 1 once daily  His updated  medication list for this problem includes:    Furosemide 40 Mg Tabs (Furosemide) .Eric [name]... 1 by mouth once daily    Atenolol 25 Mg Tabs (Atenolol) .Eric [name]... 1 once daily  Problem # 3:  HYPERLIPIDEMIA (ICD-272.4)  His updated medication list for this problem includes:    Lipitor 40 Mg Tabs (Atorvastatin calcium) .Eric [name]... 1 once daily  His updated medication list for this problem includes:    Lipitor 40 Mg Tabs (Atorvastatin calcium) .Eric [name]... 1 once daily  Complete Medication List: 1)  Celebrex 200 Mg Caps (Celecoxib) .... Once daily 2)  Lipitor 40 Mg Tabs (Atorvastatin calcium) .Eric [name].. 1 once daily 3)  Digoxin 0.125 Mg Tabs (Digoxin) .Eric [name].. 1 once daily 4)  Coumadin 4 Mg Tabs (Warfarin sodium) .... 2 mg every tuesday, and friday, and 4 mg 5 times per week 5)  Nexium 40 Mg Cpdr (Esomeprazole magnesium) .Eric [name].. 1 once daily 6)  Ambien 5 Mg Tabs (Zolpidem tartrate) .... One by mouth q hs for insomnia 7)  Furosemide 40 Mg Tabs (Furosemide) .Eric [name].. 1 by mouth once daily 8)  Anusol-hc 2.5 % Crea (Hydrocortisone) .... Use four times a day 9)  Colace 100 Mg Caps (Docusate sodium) .... One two times a day. 10)  Atenolol 25 Mg Tabs (Atenolol) .Eric [name].. 1 once daily 11)  Carbamazepine 200 Mg Tabs (Carbamazepine) .... One half tablet  twice daily 12)  Durezol 0.05 % Emul (Difluprednate) .... Both eyes 1-2 times qd 13)  Lumigan 0.01 % Soln (Bimatoprost) .... Both eyes qhs 14)  Calcium-vitamin D 600-200 Mg-unit Tabs (Calcium-vitamin d) .... Qd 15)  Klor-con M20 20 Meq Cr-tabs (Potassium chloride crys cr) .... Qd 16)  Isosorbide Mononitrate Cr 30 Mg Xr24h-tab (Isosorbide mononitrate) .... One half tablet daily  Other Orders: Protime ([id number])  Patient Instructions: 1)  Please schedule a follow-up appointment in 1 month. 2)  Limit your Sodium (Salt). 3)  It is important that you exercise regularly at least 20 minutes 5 times a week. If you develop chest pain, have severe difficulty breathing, or feel very tired , stop  exercising immediately and seek medical attention. Prescriptions: ISOSORBIDE MONONITRATE CR 30 MG XR24H-TAB (ISOSORBIDE MONONITRATE) one half tablet daily  #90 x 6   Entered and Authorized by:   [name]  MD   Signed by:   [name]  MD on 05/11/2010   Method used:   Electronically to        [facility]. [phone]* (retail)       [address]       [place], [place]  [address]       Ph: [phone] or [phone]       Fax: [phone]   RxID:   [id number] KLOR-CON M20 20 MEQ CR-TABS (POTASSIUM CHLORIDE CRYS CR) qd  #90 x 6   Entered and Authorized by:   [name]  MD   Signed by:   [name]  MD on 05/11/2010   Method used:   Electronically to        [facility]. [phone]* (retail)       [address]  [place], [place]  [address]       Ph: [phone] or [phone]       Fax: [phone]   RxID:   [id number]   Appended Document: Orders Update    Clinical Lists Changes  Orders: Added new Service order of Fingerstick [id number]) - Signed Added new Service order of Protime ([id number]) - Signed Observations: Added new observation of SYMPTOMS: dizziness, short of breath (05/11/2010 16:13) Added new observation of HOSPITALIZA: 2 weeks ago in hospital (05/11/2010 16:13) Added new observation of DIET PLAN: Salt free (05/11/2010 16:13) Added new observation of ABNORM BLEED: No (05/11/2010 16:13) Added new observation of COUMADIN CHG: No (05/11/2010 16:13)       ANTICOAGULATION RECORD PREVIOUS REGIMEN & LAB RESULTS Anticoagulation Diagnosis:  Atrial fibrillation on  01/08/2010 Previous INR Goal Range:  2.0-3.0 on  01/08/2010 Previous INR:  2.0 on  10/09/2009 Previous Coumadin Dose(mg):  2mg . Mon. & Wed. all others 4mg . on  01/08/2010 Previous Regimen:  4mg . all days this week then resume normal dose on  01/08/2010  NEW REGIMEN & LAB  RESULTS Regimen: 4mg . all days this week then resume normal dose  (no change)   Anticoagulation Visit Questionnaire Coumadin dose missed/changed:  No Abnormal Bleeding Symptoms:  No  Any diet changes including alcohol intake, vegetables or greens since the last visit:  Yes      Diet Comments:Salt free Any illnesses or hospitalizations since the last visit:  Yes      Recent Illness/Hospitalizations:  2 weeks ago in hospital Any signs of clotting since the last visit (including chest discomfort, dizziness, shortness of breath, arm tingling, slurred speech, swelling or redness in leg):  Yes      Signs of Clotting:  dizziness, short of breath  MEDICATIONS CELEBREX 200 MG  CAPS (CELECOXIB) once daily LIPITOR 40 MG  TABS (ATORVASTATIN CALCIUM) 1 once daily DIGOXIN 0.125 MG  TABS (DIGOXIN) 1 once daily COUMADIN 4 MG  TABS (WARFARIN SODIUM) 2 mg every Tuesday, and Friday, and 4 mg 5 times per week NEXIUM 40 MG  CPDR (ESOMEPRAZOLE MAGNESIUM) 1 once daily AMBIEN 5 MG TABS (ZOLPIDEM TARTRATE) one by mouth q hs for insomnia FUROSEMIDE 40 MG TABS (FUROSEMIDE) 1 by mouth once daily ANUSOL-HC 2.5 % CREA (HYDROCORTISONE) Use four times a day COLACE 100 MG CAPS (DOCUSATE SODIUM) One two times a day. ATENOLOL 25 MG TABS (ATENOLOL) 1 once daily CARBAMAZEPINE 200 MG TABS (CARBAMAZEPINE) one half tablet  twice daily DUREZOL 0.05 % EMUL (DIFLUPREDNATE) both eyes 1-2 times qd LUMIGAN 0.01 % SOLN (BIMATOPROST) both eyes qhs CALCIUM-VITAMIN D 600-200 MG-UNIT TABS (CALCIUM-VITAMIN D) qd KLOR-CON M20 20 MEQ CR-TABS (POTASSIUM CHLORIDE CRYS CR) qd ISOSORBIDE MONONITRATE CR 30 MG XR24H-TAB (ISOSORBIDE MONONITRATE) one half tablet daily    Laboratory Results

## 2010-10-25 NOTE — Progress Notes (Signed)
Summary: unstable - TO ER  Phone Note From Other Clinic   Caller: Becky - [facility] Summary of Call: went out to pt home to check on - when she arrived - pt c/o weakness , chills, not feeling well. Bp 80/50 HR 56 regular sats 100% , lungs clear. Pale in color.  [phone]  Follow-up for Phone Call        after informing Dr. Amador Cunas - instructed pt to go to ER - nearest ER .  Called Becky back and informed. KIK Follow-up by: Duard Brady LPN,  May 16, 2010 11:33 AM

## 2010-10-25 NOTE — Letter (Signed)
Summary: [facility]   Imported By: Maryln Gottron 06/05/2010 14:06:06  _____________________________________________________________________  External Attachment:    Type:   Image     Comment:   External Document

## 2010-10-25 NOTE — Progress Notes (Signed)
Summary: pick up Lipitor  Phone Note Outgoing Call   Call placed by: Duard Brady LPN,  March 01, 6044 11:41 AM Call placed to: Patient Summary of Call: attempt to call about Liptior that has arrived at office for him to pick up - ans mach . LMTCB KIK Initial call taken by: Duard Brady LPN,  March 01, 4097 11:42 AM

## 2010-10-25 NOTE — Progress Notes (Signed)
Summary: tightness in chest  Phone Note Call from Patient Call back at Home Phone [phone]   Caller: VM 1:05 Summary of Call: Tightness in chest.  Feel like I'm going to pass out or something.  Come there or what? Called.  Dr. Kirtland Bouchard off this pm.  No pain or radiation into arms or neck.   He will go to ER in [place].  If it's his heart or something like that, he says he'll get transferred to [facility].   Initial call taken by: Rudy Jew, RN,  April 25, 2010 1:40 PM

## 2010-10-25 NOTE — Progress Notes (Signed)
Summary: Coumadin  Phone Note Call from Patient Call back at [phone]   Caller: Patient Reason for Call: Talk to Nurse Summary of Call: Patient wants to speak to you.  Message is unclear - I think he may be asking about his Coumadin.   Initial call taken by: Everrett Coombe,  December 25, 2009 8:25 AM  Follow-up for Phone Call        spoke with pt  - calling about coumadin - had his pt/inr checked thursday 4/1 - we have not rec'd lab yet. I asked him to call lab in [place] and have them send [place] the results , Dr. Kirtland Bouchard out this week , but I will have one of the other Doctros review and I will call with any changes to meds. KIK Follow-up by: Duard Brady LPN,  December 25, 2009 10:30 AM

## 2010-10-25 NOTE — Assessment & Plan Note (Signed)
Summary: FORM COMPLETION/RCD   Vital Signs:  Patient profile:   77 year old male Weight:      211 pounds Temp:     97.9 degrees F oral BP sitting:   124 / 76  (left arm) Cuff size:   regular  Vitals Entered By: Duard Brady LPN (September 27, 2010 3:18 PM) CC: c/o headaches on/off , check (R) arm -?? lesions Is Patient Diabetic? No   CC:  c/o headaches on/off  and check (R) arm -?? lesions.  History of Present Illness: 77 -year-old patient who is seen today for follow-up.  He has some disability form is related to a hospital admission in August at which time he was admitted for treatment of upper GI bleeding related to gastric ulcers.  He has been on chronic Coumadin anticoagulation due to atrial fibrillation.  He has treated hypertension.  He has been on Tegretol for some time due to trigeminal neuralgia.  This has been stable for approximately 1 year.  He has a history of treated hypertension and dyslipidemia.  His forms were completed. He was seen last month for a viral URI, which is resolving nicely  Allergies: 1)  ! Sulfa 2)  ! Codeine  Past History:  Past Medical History: Reviewed history from 05/22/2010 and no changes required. Coronary artery disease GERD Hyperlipidemia Hypertension Atrial fibrillation trigeminal neuralgia history of congestive heart failure Congestive heart failure, history of history of NSAID-induced gastric ulcers, August 2011  Past Surgical History: Reviewed history from 05/22/2010 and no changes required. L Knee Surgery L Shoulder Surgery Cholecystectomy status post right total hip replacement surgery  August 2008 colonoscopy in  2002 status post left knee replacement surgery June 2009 EGD and colonoscopy August 2011  Review of Systems       The patient complains of prolonged cough.  The patient denies anorexia, fever, weight loss, weight gain, vision loss, decreased hearing, hoarseness, chest pain, syncope, dyspnea on exertion,  peripheral edema, headaches, hemoptysis, abdominal pain, melena, hematochezia, severe indigestion/heartburn, hematuria, incontinence, genital sores, muscle weakness, suspicious skin lesions, transient blindness, difficulty walking, depression, unusual weight change, abnormal bleeding, enlarged lymph nodes, angioedema, breast masses, and testicular masses.    Physical Exam  General:  overweight-appearing.  normal blood pressureoverweight-appearing.   Head:  Normocephalic and atraumatic without obvious abnormalities. No apparent alopecia or balding. Eyes:  No corneal or conjunctival inflammation noted. EOMI. Perrla. Funduscopic exam benign, without hemorrhages, exudates or papilledema. Vision grossly normal. Ears:  External ear exam shows no significant lesions or deformities.  Otoscopic examination reveals clear canals, tympanic membranes are intact bilaterally without bulging, retraction, inflammation or discharge. Hearing is grossly normal bilaterally. Mouth:  Oral mucosa and oropharynx without lesions or exudates.  Teeth in good repair. Neck:  No deformities, masses, or tenderness noted. Lungs:  Normal respiratory effort, chest expands symmetrically. Lungs are clear to auscultation, no crackles or wheezes. Heart:  controlled ventricular response Abdomen:  Bowel sounds positive,abdomen soft and non-tender without masses, organomegaly or hernias noted. Msk:  No deformity or scoliosis noted of thoracic or lumbar spine.   Extremities:  No clubbing, cyanosis, edema, or deformity noted with normal full range of motion of all joints.     Impression & Recommendations:  Problem # 1:  NEURALGIA, TRIGEMINAL (ICD-350.1) will decrease the Tegretol to one half tablet daily for one month and then discontinue.  Entirely if  stable  Problem # 2:  VIRAL URI (ICD-465.9)  His updated medication list for this problem includes:  Tylenol 325 Mg Tabs (Acetaminophen) .Marland Kitchen... Prn    Allegra Allergy 180 Mg Tabs  (Fexofenadine hcl) ..... One daily    Benzonatate 100 Mg Caps (Benzonatate) ..... One every 8 hours  His updated medication list for this problem includes:    Tylenol 325 Mg Tabs (Acetaminophen) .Marland Kitchen... Prn    Allegra Allergy 180 Mg Tabs (Fexofenadine hcl) ..... One daily    Benzonatate 100 Mg Caps (Benzonatate) ..... One every 8 hours  Complete Medication List: 1)  Lipitor 40 Mg Tabs (Atorvastatin calcium) .Marland Kitchen.. 1 once daily 2)  Nexium 40 Mg Cpdr (Esomeprazole magnesium) .Marland Kitchen.. 1 once daily 3)  Ambien 5 Mg Tabs (Zolpidem tartrate) .... One by mouth q hs for insomnia 4)  Colace 100 Mg Caps (Docusate sodium) .... One two times a day. 5)  Carbamazepine 200 Mg Tabs (Carbamazepine) .... One half tablet daily for one month and then discontinue 6)  Durezol 0.05 % Emul (Difluprednate) .... Both eyes 1-2 times qd 7)  Lumigan 0.01 % Soln (Bimatoprost) .... Both eyes qhs 8)  Calcium-vitamin D 600-200 Mg-unit Tabs (Calcium-vitamin d) .... Qd 9)  Proventil Hfa 108 (90 Base) Mcg/act Aers (Albuterol sulfate) .... As directed 10)  Tylenol 325 Mg Tabs (Acetaminophen) .... Prn 11)  Vitamin C 500 Mg Tabs (Ascorbic acid) .... Qd 12)  Tramadol Hcl 50 Mg Tabs (Tramadol hcl) .... One every 6 hours as needed for pain 13)  Coumadin 5 Mg Tabs (Warfarin sodium) .... One and 1/2 tab tues and thurs and one 5 days a week 14)  Allegra Allergy 180 Mg Tabs (Fexofenadine hcl) .... One daily 15)  Benzonatate 100 Mg Caps (Benzonatate) .... One every 8 hours  Other Orders: Protime (62130QM)  Patient Instructions: 1)  Please schedule a follow-up appointment in 4 months. 2)  Limit your Sodium (Salt). 3)  It is important that you exercise regularly at least 20 minutes 5 times a week. If you develop chest pain, have severe difficulty breathing, or feel very tired , stop exercising immediately and seek medical attention. 4)  You need to lose weight. Consider a lower calorie diet and regular exercise.    Orders Added: 1)  Est.  Patient Level III [57846] 2)  Protime [[id number]]  Appended Document: FORM COMPLETION/RCD   ANTICOAGULATION RECORD PREVIOUS REGIMEN & LAB RESULTS Anticoagulation Diagnosis:  Atrial fibrillation on  01/08/2010 Previous INR Goal Range:  2.0-3.0 on  01/08/2010 Previous INR:  2.0 on  10/09/2009 Previous Coumadin Dose(mg):  2mg . Mon. & Wed. all others 4mg . on  01/08/2010 Previous Regimen:  4mg . all days this week then resume normal dose on  01/08/2010  NEW REGIMEN & LAB RESULTS Current INR: 1.7 Regimen: 7.5mg  M, W, F all others 5mg .  Repeat testing in: 4 weeks  Anticoagulation Visit Questionnaire Coumadin dose missed/changed:  No Abnormal Bleeding Symptoms:  No  Any diet changes including alcohol intake, vegetables or greens since the last visit:  No Any illnesses or hospitalizations since the last visit:  No Any signs of clotting since the last visit (including chest discomfort, dizziness, shortness of breath, arm tingling, slurred speech, swelling or redness in leg):  No  MEDICATIONS LIPITOR 40 MG  TABS (ATORVASTATIN CALCIUM) 1 once daily NEXIUM 40 MG  CPDR (ESOMEPRAZOLE MAGNESIUM) 1 once daily AMBIEN 5 MG TABS (ZOLPIDEM TARTRATE) one by mouth q hs for insomnia COLACE 100 MG CAPS (DOCUSATE SODIUM) One two times a day. CARBAMAZEPINE 200 MG TABS (CARBAMAZEPINE) one half tablet daily for one month and then discontinue DUREZOL  0.05 % EMUL (DIFLUPREDNATE) both eyes 1-2 times qd LUMIGAN 0.01 % SOLN (BIMATOPROST) both eyes qhs CALCIUM-VITAMIN D 600-200 MG-UNIT TABS (CALCIUM-VITAMIN D) qd PROVENTIL HFA 108 (90 BASE) MCG/ACT AERS (ALBUTEROL SULFATE) as directed TYLENOL 325 MG TABS (ACETAMINOPHEN) prn VITAMIN C 500 MG TABS (ASCORBIC ACID) qd TRAMADOL HCL 50 MG TABS (TRAMADOL HCL) one every 6 hours as needed for pain COUMADIN 5 MG TABS (WARFARIN SODIUM) one and 1/2 tab Tues and Thurs and one 5 days a week ALLEGRA ALLERGY 180 MG TABS (FEXOFENADINE HCL) one daily BENZONATATE 100 MG CAPS  (BENZONATATE) one every 8 hours    Laboratory Results   Blood Tests      INR: 1.7   (Normal Range: 0.88-1.12   Therap INR: 2.0-3.5) Comments: Rita Ohara  September 27, 2010 4:54 PM

## 2010-10-25 NOTE — Progress Notes (Signed)
Summary: Cardiology referral - rov here first  Phone Note Call from Patient   Caller: Patient Call For: [name]  MD Summary of Call: Pt is asking for referral to a Cardiologist.  Asking to speak to [facility]. [phone] Initial call taken by: Lynann Beaver CMA,  May 21, 2010 10:58 AM  Follow-up for Phone Call        spoke with mr. Micki Riley and Becky(home health nurse) - went to hospital last week as ordered - dx - GI bleed.  Comadin and Bp meds stopped - instructed to f/u with cardio. Needs referrral.   to Dr. Amador Cunas to advise. KIK Follow-up by: Duard Brady LPN,  May 21, 2010 11:38 AM  Additional Follow-up for Phone Call Additional follow up Details #1::        will discuss at rov  this week  Additional Follow-up by: [name]  MD,  May 21, 2010 12:31 PM    Additional Follow-up for Phone Call Additional follow up Details #2::    spoke with pt - appt made for tomorrow , instructed to bring dc instructions and all meds. KIK Follow-up by: Duard Brady LPN,  May 21, 2010 12:58 PM

## 2010-10-25 NOTE — Progress Notes (Signed)
Summary: lab needed  Phone Note Call from Patient Call back at Home Phone [phone]   Caller: Patient----live call Summary of Call: when should he come and ger his pt/inr done? Initial call taken by: Warnell Forester,  September 04, 2010 10:16 AM  Follow-up for Phone Call        spoke with pt - he started 5mg  on 12/5 as directed - need to repeat lab this thurs or friday.  verbalized understanding Follow-up by: Duard Brady LPN,  September 04, 2010 10:38 AM

## 2010-10-25 NOTE — Miscellaneous (Signed)
Summary: coumadin increase - rx to [facility]  Clinical Lists Changes  Medications: Added new medication of COUMADIN 5 MG TABS (WARFARIN SODIUM) once daily as directed - Signed Rx of COUMADIN 5 MG TABS (WARFARIN SODIUM) once daily as directed;  #90 x 3;  Signed;  Entered by: Duard Brady LPN;  Authorized by: [name]  MD;  Method used: Faxed to [facility] [phone]*, [address] [place], [place], [place]  [address], Ph: [phone] or [phone], Fax: [phone]    Prescriptions: COUMADIN 5 MG TABS (WARFARIN SODIUM) once daily as directed  #90 x 3   Entered by:   Duard Brady LPN   Authorized by:   [name]  MD   Signed by:   Duard Brady LPN on 28/41/3244   Method used:   Faxed to ...       [facility] [place] [facility] [phone]* (retail)       [address]       [place], [place]  [address]       Ph: [phone] or [phone]       Fax: [phone]   RxID:   [id number]

## 2010-10-25 NOTE — Assessment & Plan Note (Signed)
Summary: Follow up/cb   Vital Signs:  Patient profile:   77 year old male Weight:      206 pounds O2 Sat:      98 % on Room air Temp:     98.1 degrees F oral Pulse rate:   74 / minute BP sitting:   110 / 80  (left arm) Cuff size:   regular  Vitals Entered By: Kathrynn Speed CMA (May 15, 2010 75:07 AM)  O2 Flow:  Room air and a it insist on lunch one she the [facility]: Went to ER on Sunday coudn't breathe, src   CC:  Went to ER on Sunday coudn't breathe and src.  History of Present Illness: 77 year old patient who is followed by cardiology and [facility].  He apparently was admitted to the hospital recently for congestive heart failure.  He had an episode of shortness of breath two days ago and was seen at a local emergency room and given a diagnosis of COPD.  He was prescribed a metered-dose inhaler.  It sounds that he became quite short of breath after standing quickly.  There has been no wheezing, productive cough, or chest congestion.  He denies any fever.  He was noted have a near-syncopal episode in our waiting room when he stood from a sitting position to ambulate to the examining room.  At the present time he feels that he is back to baseline and denies any shortness of breath.  He denies any peripheral edema.  He denies any PND or orthopnea.  He has a history of coronary artery disease, hypertension.  During his hospital admission isosorbide, mononitrate was added to his regimen.  This was decreased when he was seen here earlier with mild orthostatic symptoms  Current Medications (verified): 1)  Celebrex 200 Mg  Caps (Celecoxib) .... Once Daily 2)  Lipitor 40 Mg  Tabs (Atorvastatin Calcium) .Marland Kitchen.. 1 Once Daily 3)  Digoxin 0.125 Mg  Tabs (Digoxin) .Marland Kitchen.. 1 Once Daily 4)  Coumadin 4 Mg  Tabs (Warfarin Sodium) .... 2 Mg Every Tuesday, and Friday, and 4 Mg 5 Times Per Week 5)  Nexium 40 Mg  Cpdr (Esomeprazole Magnesium) .Marland Kitchen.. 1 Once Daily 6)  Ambien 5 Mg Tabs (Zolpidem Tartrate) ....  One By Mouth Q Hs For Insomnia 7)  Furosemide 40 Mg Tabs (Furosemide) .Marland Kitchen.. 1 By Mouth Once Daily 8)  Anusol-Hc 2.5 % Crea (Hydrocortisone) .... Use Four Times A Day 9)  Colace 100 Mg Caps (Docusate Sodium) .... One Two Times A Day. 10)  Atenolol 25 Mg Tabs (Atenolol) .Marland Kitchen.. 1 Once Daily 11)  Carbamazepine 200 Mg Tabs (Carbamazepine) .... One Half Tablet  Twice Daily 12)  Durezol 0.05 % Emul (Difluprednate) .... Both Eyes 1-2 Times Qd 13)  Lumigan 0.01 % Soln (Bimatoprost) .... Both Eyes Qhs 14)  Calcium-Vitamin D 600-200 Mg-Unit Tabs (Calcium-Vitamin D) .... Qd 15)  Klor-Con M20 20 Meq Cr-Tabs (Potassium Chloride Crys Cr) .... Qd 16)  Isosorbide Mononitrate Cr 30 Mg Xr24h-Tab (Isosorbide Mononitrate) .... One Half Tablet Daily  Allergies (verified): 1)  ! Sulfa 2)  ! Codeine  Past History:  Past Medical History: Coronary artery disease GERD Hyperlipidemia Hypertension Atrial fibrillation trigeminal neuralgia history of congestive heart failure Congestive heart failure, history of  Review of Systems       The patient complains of anorexia, dyspnea on exertion, and muscle weakness.  The patient denies fever, weight loss, weight gain, vision loss, decreased hearing, hoarseness, chest pain, syncope, peripheral edema, prolonged cough,  headaches, hemoptysis, abdominal pain, melena, hematochezia, severe indigestion/heartburn, hematuria, incontinence, genital sores, suspicious skin lesions, transient blindness, difficulty walking, depression, unusual weight change, abnormal bleeding, enlarged lymph nodes, angioedema, breast masses, and testicular masses.    Physical Exam  General:  overweight-appearing.  alert and appropriate no distress; blood pressure 120/70, sitting and 100/60, standing Head:  Normocephalic and atraumatic without obvious abnormalities. No apparent alopecia or balding. Mouth:  Oral mucosa and oropharynx without lesions or exudates.  Teeth in good repair. Neck:  No  deformities, masses, or tenderness noted. Lungs:  Normal respiratory effort, chest expands symmetrically. Lungs are clear to auscultation, no crackles or wheezes. O2 saturation 98 Heart:  Normal rate and  irregular rhythm. S1 and S2 normal without gallop, murmur, click, rub or other extra sounds. no gallop heart rate 90 to 95 Abdomen:  Bowel sounds positive,abdomen soft and non-tender without masses, organomegaly or hernias noted. Msk:  No deformity or scoliosis noted of thoracic or lumbar spine.   Extremities:  no clinical edema   Impression & Recommendations:  Problem # 1:  HYPOTENSION, ORTHOSTATIC (ICD-458.0) this was associated with near syncope  in our waiting room; will discontinue diuretic therapy, isosorbide mononitrate, and beta-blocker therapy.  Will continue digoxin for rate control  Problem # 2:  CONGESTIVE HEART FAILURE (ICD-428.0)  The following medications were removed from the medication list:    Furosemide 40 Mg Tabs (Furosemide) .Marland Kitchen... 1 by mouth once daily    Atenolol 25 Mg Tabs (Atenolol) .Marland Kitchen... 1 once daily His updated medication list for this problem includes:    Digoxin 0.125 Mg Tabs (Digoxin) .Marland Kitchen... 1 once daily    Coumadin 4 Mg Tabs (Warfarin sodium) .Marland Kitchen... 2 mg every tuesday, and friday, and 4 mg 5 times per week no clinical evidence of failure at this time.  Will need to obtain hospital records.  Results of the 2-D echocardiogram, etc.  Problem # 3:  CORONARY ARTERY DISEASE (ICD-414.00)  The following medications were removed from the medication list:    Furosemide 40 Mg Tabs (Furosemide) .Marland Kitchen... 1 by mouth once daily    Atenolol 25 Mg Tabs (Atenolol) .Marland Kitchen... 1 once daily    Isosorbide Mononitrate Cr 30 Mg Xr24h-tab (Isosorbide mononitrate) ..... One half tablet daily  Complete Medication List: 1)  Celebrex 200 Mg Caps (Celecoxib) .... Once daily 2)  Lipitor 40 Mg Tabs (Atorvastatin calcium) .Marland Kitchen.. 1 once daily 3)  Digoxin 0.125 Mg Tabs (Digoxin) .Marland Kitchen.. 1 once  daily 4)  Coumadin 4 Mg Tabs (Warfarin sodium) .... 2 mg every tuesday, and friday, and 4 mg 5 times per week 5)  Nexium 40 Mg Cpdr (Esomeprazole magnesium) .Marland Kitchen.. 1 once daily 6)  Ambien 5 Mg Tabs (Zolpidem tartrate) .... One by mouth q hs for insomnia 7)  Anusol-hc 2.5 % Crea (Hydrocortisone) .... Use four times a day 8)  Colace 100 Mg Caps (Docusate sodium) .... One two times a day. 9)  Carbamazepine 200 Mg Tabs (Carbamazepine) .... One half tablet  twice daily 10)  Durezol 0.05 % Emul (Difluprednate) .... Both eyes 1-2 times qd 11)  Lumigan 0.01 % Soln (Bimatoprost) .... Both eyes qhs 12)  Calcium-vitamin D 600-200 Mg-unit Tabs (Calcium-vitamin d) .... Qd 13)  Proventil Hfa 108 (90 Base) Mcg/act Aers (Albuterol sulfate) .... As directed  Patient Instructions: 1)  Please schedule a follow-up appointment in 2 weeks. 2)  Drink as much fluid as you can tolerate for the next few days.

## 2010-10-25 NOTE — Assessment & Plan Note (Signed)
Summary: trauma to head and eye/dm   Vital Signs:  Patient profile:   77 year old male Weight:      209 pounds Temp:     98.0 degrees F oral BP sitting:   100 / 72  (left arm) Cuff size:   regular  Vitals Entered By: Raechel Ache, RN (September 25, 2009 4:10 PM) CC: Larey Seat out of bed yesterday morning and hit L eye- bled a lot and sore. Is Patient Diabetic? No   CC:  Larey Seat out of bed yesterday morning and hit L eye- bled a lot and sore.Marland Kitchen  History of Present Illness: 77 year old patient who has a history of hypertension, chronic atrial fibrillation, on chronic Coumadin.  He fell out of bed yesterday morning, sustaining trauma to his left eye, and right ear.  He also gives a two to 36-month history of chart, fleeting needlelike pain involving his right hemifacial area.  This occurs frequently throughout the day and he, quite uncomfortable.  His cardiopulmonary status has been stable.  He has corny artery disease, which has been stable.  He denies any chest pain  Allergies: 1)  ! Sulfa 2)  ! Codeine  Past History:  Past Medical History: Reviewed history from 12/29/2007 and no changes required. Coronary artery disease GERD Hyperlipidemia Hypertension Atrial fibrillation  Physical Exam  General:  overweight-appearing.  low-normal blood pressure Head:  Normocephalic and atraumatic without obvious abnormalities. No apparent alopecia or balding. Eyes:  a superficial laceration with crusting noted involving his left eyelid Ears:  a small laceration very superficial involving the right external ear Mouth:  Oral mucosa and oropharynx without lesions or exudates.  edentulous Neck:  No deformities, masses, or tenderness noted. Lungs:  Normal respiratory effort, chest expands symmetrically. Lungs are clear to auscultation, no crackles or wheezes. Heart:  Normal rate and regular rhythm. S1 and S2 normal without gallop, murmur, click, rub or other extra sounds.   Impression &  Recommendations:  Problem # 1:  NEURALGIA, TRIGEMINAL (ICD-350.1) will treat with Tegretol 200 mg twice daily.  Will recheck in two weeks to assess efficacy and also check a CBC and ProTime.  The medication may interfere with the anticoagulant effects of Coumadin  Problem # 2:  HYPERTENSION (ICD-401.9)  His updated medication list for this problem includes:    Furosemide 40 Mg Tabs (Furosemide) .Marland Kitchen... 1 by mouth once daily    Atenolol 25 Mg Tabs (Atenolol) .Marland Kitchen... 1 once daily  Complete Medication List: 1)  Celebrex 200 Mg Caps (Celecoxib) .... Once daily 2)  Lipitor 40 Mg Tabs (Atorvastatin calcium) .Marland Kitchen.. 1 once daily 3)  Digoxin 0.125 Mg Tabs (Digoxin) .Marland Kitchen.. 1 once daily 4)  Coumadin 4 Mg Tabs (Warfarin sodium) .... 2 mg every tuesday, and friday, and 4 mg 5 times per week 5)  Nexium 40 Mg Cpdr (Esomeprazole magnesium) .Marland Kitchen.. 1 once daily 6)  Ambien 5 Mg Tabs (Zolpidem tartrate) .... One by mouth q hs for insomnia 7)  Furosemide 40 Mg Tabs (Furosemide) .Marland Kitchen.. 1 by mouth once daily 8)  Anusol-hc 2.5 % Crea (Hydrocortisone) .... Use four times a day 9)  Colace 100 Mg Caps (Docusate sodium) .... One two times a day. 10)  Atenolol 25 Mg Tabs (Atenolol) .Marland Kitchen.. 1 once daily 11)  Carbamazepine 200 Mg Tabs (Carbamazepine) .... One twice daily  Patient Instructions: 1)  Please schedule a follow-up appointment in 2 weeks. 2)  Limit your Sodium (Salt) to less than 2 grams a day(slightly less than 1/2  a teaspoon) to prevent fluid retention, swelling, or worsening of symptoms. Prescriptions: CARBAMAZEPINE 200 MG TABS (CARBAMAZEPINE) one twice daily  #50 x 0   Entered and Authorized by:   [name]  MD   Signed by:   [name]  MD on 09/25/2009   Method used:   Print then Give to Patient   RxID:   [id number]

## 2010-10-25 NOTE — Progress Notes (Signed)
Summary: PT and coumadin adjustment  Phone Note Outgoing Call Call back at Home Phone [phone]   Call placed by: Duard Brady LPN,  August 24, 2010 3:17 PM Call placed to: Patient Summary of Call: attempt to call per Dr. Vergia Alberts request r/t PT 14.3 - need to know what coumadin mg he has at house and is currently taking - I belive he has 2mg  tabs - and is taking 4mg  as we last discussed.   Needs to be on 5mg  at this time per Dr. Amador Cunas - please call to discuss - I will take call when he calls. KIK Initial call taken by: Duard Brady LPN,  August 24, 2010 3:18 PM  Follow-up for Phone Call        Pt returned your call. Pls call back asap.  Follow-up by: Lucy Antigua,  August 27, 2010 9:38 AM  Additional Follow-up for Phone Call Additional follow up Details #1::        spoke with pt earlier r/t labs and the correct dose of warfarin he must take - he has 2mg  at the house , understands he needs to take 5mg  - does not want to cut pills in halg - 1mg  tabs faxed to [facility] per pt request and he understands to take 2 - 2mg  and 1 - 1mg  at the same time for a total dose of 5 mg. daily. KIK Additional Follow-up by: Duard Brady LPN,  August 27, 2010 3:13 PM    New/Updated Medications: COUMADIN 1 MG TABS (WARFARIN SODIUM) 1 by mouth once daily Prescriptions: COUMADIN 1 MG TABS (WARFARIN SODIUM) 1 by mouth once daily  #30 x 6   Entered by:   Duard Brady LPN   Authorized by:   [name]  MD   Signed by:   Duard Brady LPN on 56/21/3086   Method used:   Electronically to        [facility]. [phone]* (retail)       [address]       [place], [place]  [address]       Ph: [phone] or [phone]       Fax: [phone]   RxID:   [id number]

## 2010-10-25 NOTE — Assessment & Plan Note (Signed)
Summary: post hosp. - per Dr. Williams Che   Vital Signs:  Patient profile:   77 year old male Weight:      214 pounds Temp:     97.7 degrees F oral BP sitting:   112 / 74  (right arm) Cuff size:   regular CC: post hospital doing much better Is Patient Diabetic? No   CC:  post hospital doing much better.  History of Present Illness: 77 year old patient who is seen today post hospital admission.  He was admitted with upper gastrointestinal bleeding secondary to two small shallow gastric ulcers.  He had been on chronic Celebrex, as well as Coumadin anticoagulation.  He has significant osteoarthritis.  Hospital course was complicated by bradycardia and the digoxin therapy was discontinued.  Prehospital admission diuretic therapy, and atenolol were discontinued due to orthostatic hypotension.  Today he feels well.  He is scheduled for GI follow-up in two days and cardiology follow-up in 7 days;  Coumadin anticoagulation has been on hold.  He denies any symptoms of heart failure.  He is very fearful that his arthritis pain and stiffness will be debilitating.  Allergies: 1)  ! Sulfa 2)  ! Codeine  Past History:  Past Medical History: Coronary artery disease GERD Hyperlipidemia Hypertension Atrial fibrillation trigeminal neuralgia history of congestive heart failure Congestive heart failure, history of history of NSAID-induced gastric ulcers, August 2011  Past Surgical History: L Knee Surgery L Shoulder Surgery Cholecystectomy status post right total hip replacement surgery  August 2008 colonoscopy in  2002 status post left knee replacement surgery June 2009 EGD and colonoscopy August 2011  Family History: Reviewed history from 07/23/2007 and no changes required. Family History of Arthritis Family History Diabetes 1st degree relative Family History Hypertension Family History of Stroke M 1st degree relative <50 Family History of Sudden Death Family History of  Cardiovascular disorder father died age 43 about 66 3 sisters positive for diabetes  Social History: Reviewed history from 10/23/2007 and no changes required. Retired Married Never Smoked Alcohol use-no Drug use-no Regular exercise-no  Review of Systems  The patient denies anorexia, fever, weight loss, weight gain, vision loss, decreased hearing, hoarseness, chest pain, syncope, dyspnea on exertion, peripheral edema, prolonged cough, headaches, hemoptysis, abdominal pain, melena, hematochezia, severe indigestion/heartburn, hematuria, incontinence, genital sores, muscle weakness, suspicious skin lesions, transient blindness, difficulty walking, depression, unusual weight change, abnormal bleeding, enlarged lymph nodes, angioedema, breast masses, and testicular masses.    Physical Exam  General:  Well-developed,well-nourished,in no acute distress; alert,appropriate and cooperative throughout examination; blood pressure 100/72 Head:  Normocephalic and atraumatic without obvious abnormalities. No apparent alopecia or balding. Eyes:  No corneal or conjunctival inflammation noted. EOMI. Perrla. Funduscopic exam benign, without hemorrhages, exudates or papilledema. Vision grossly normal. Mouth:  Oral mucosa and oropharynx without lesions or exudates.  Teeth in good repair. Neck:  No deformities, masses, or tenderness noted. Lungs:  few crackles, right base Heart:  irregular rhythm with a controlled ventricular response Abdomen:  Bowel sounds positive,abdomen soft and non-tender without masses, organomegaly or hernias noted. Msk:  No deformity or scoliosis noted of thoracic or lumbar spine.   Extremities:  no edema   Impression & Recommendations:  Problem # 1:  UPPER GASTROINTESTINAL HEMORRHAGE (ICD-578.9)  Problem # 2:  GASTRIC ULCER, ACUTE, HEMORRHAGE (ICD-531.00)  His updated medication list for this problem includes:    Nexium 40 Mg Cpdr (Esomeprazole magnesium) .Marland Kitchen... 1 once  daily    Sucralfate 1 Gm Tabs (Sucralfate) .Marland KitchenMarland KitchenMarland KitchenMarland Kitchen  Qd  His updated medication list for this problem includes:    Nexium 40 Mg Cpdr (Esomeprazole magnesium) .Marland Kitchen... 1 once daily    Sucralfate 1 Gm Tabs (Sucralfate) ..... Qd  Problem # 3:  CONGESTIVE HEART FAILURE (ICD-428.0)  The following medications were removed from the medication list:    Digoxin 0.125 Mg Tabs (Digoxin) .Marland Kitchen... 1 once daily    Coumadin 4 Mg Tabs (Warfarin sodium) .Marland Kitchen... 2 mg every tuesday, and friday, and 4 mg 5 times per week clinically stable  Problem # 4:  HYPERTENSION (ICD-401.9)  stable off antihypertensive medication  Problem # 5:  ATRIAL FIBRILLATION (ICD-427.31) the patient's Coumadin anti-coagulation  is presently on hold; the patient will be seen in consultation by GI in two days and cardiology in 7 days.  Will consider resuming Coumadin anticoagulation in one to two weeks  Complete Medication List: 1)  Lipitor 40 Mg Tabs (Atorvastatin calcium) .Marland Kitchen.. 1 once daily 2)  Nexium 40 Mg Cpdr (Esomeprazole magnesium) .Marland Kitchen.. 1 once daily 3)  Ambien 5 Mg Tabs (Zolpidem tartrate) .... One by mouth q hs for insomnia 4)  Colace 100 Mg Caps (Docusate sodium) .... One two times a day. 5)  Carbamazepine 200 Mg Tabs (Carbamazepine) .... One half tablet  twice daily 6)  Durezol 0.05 % Emul (Difluprednate) .... Both eyes 1-2 times qd 7)  Lumigan 0.01 % Soln (Bimatoprost) .... Both eyes qhs 8)  Calcium-vitamin D 600-200 Mg-unit Tabs (Calcium-vitamin d) .... Qd 9)  Proventil Hfa 108 (90 Base) Mcg/act Aers (Albuterol sulfate) .... As directed 10)  Tylenol 325 Mg Tabs (Acetaminophen) .... Prn 11)  Vitamin C 500 Mg Tabs (Ascorbic acid) .... Qd 12)  Sucralfate 1 Gm Tabs (Sucralfate) .... Qd  Patient Instructions: 1)  GI and cardiology follow-up as scheduled 2)  iron one tablet daily 3)  Please schedule a follow-up appointment in 1 month. 4)  Take 650-1000mg  of Tylenol every 4-6 hours as needed for relief of pain or comfort of fever  AVOID taking more than 4000mg   in a 24 hour period (can cause liver damage in higher doses). 5)  no aspirin, Advil, Aleve, or other anti-inflammatories 6)  Limit your Sodium (Salt).

## 2010-10-25 NOTE — Progress Notes (Signed)
Summary: Pt req samples of Lipitor  Phone Note Call from Patient Call back at Home Phone [phone]   Caller: Patient Summary of Call: Pt req samples of Lipitor. Please call when ready for pick up.  Initial call taken by: Lucy Antigua,  September 25, 2009 5:01 PM  Follow-up for Phone Call        none available a present; call back in one week Follow-up by: [name]  MD,  September 25, 2009 5:43 PM  Additional Follow-up for Phone Call Additional follow up Details #1::        Pt has been informed that there are no lipitor samples available, as noted above. Additional Follow-up by: Lucy Antigua,  September 26, 2009 12:27 PM

## 2010-10-25 NOTE — Progress Notes (Signed)
Summary: ? about Coumadin  Phone Note Call from Patient Call back at Home Phone [phone]   Caller: Patient Call For: [name]  MD Summary of Call: Pt would like to speak to [facility] regarding his Coumadin. Initial call taken by: [facility] CMA AAMA,  August 08, 2010 1:45 PM  Follow-up for Phone Call        Pt called back again to get coumedin results. Pls call back asap.  Follow-up by: Lucy Antigua,  August 09, 2010 10:41 AM  Additional Follow-up for Phone Call Additional follow up Details #1::        spoke with pt - we have not rev'd any results as of yet. he will call pam at [place] and get her to fax. KIk Additional Follow-up by: Duard Brady LPN,  August 09, 2010 1:25 PM

## 2010-10-25 NOTE — Miscellaneous (Signed)
Summary: Home Health Discharge [facility] Discharge [facility]   Imported By: Maryln Gottron 06/19/2010 15:47:04  _____________________________________________________________________  External Attachment:    Type:   Image     Comment:   External Document

## 2010-10-25 NOTE — Progress Notes (Signed)
Summary: lab results  Phone Note Call from Patient   Caller: Patient Call For: [name]  MD Summary of Call: pt called wanting to know lab - current dose coumadin 4mg  daily.   lab was placed in your office this AM  Initial call taken by: Duard Brady LPN,  August 10, 2010 2:17 PM  Follow-up for Phone Call        increase dose to 6mg  daily (1 1/2 tabs) repeat PT/INR  2 weeks Follow-up by: [name]  MD,  August 10, 2010 2:56 PM  Additional Follow-up for Phone Call Additional follow up Details #1::        spoke with pt - instructed to increase to 6mg  - pt states he doesnt have the money - instructed to call Pam at coumadin clinic in [place] to see if they have any samples. KIK Additional Follow-up by: Duard Brady LPN,  August 10, 2010 4:19 PM

## 2010-10-25 NOTE — Medication Information (Signed)
Summary: Pick up confirmation/Celebrex  Pick up confirmation/Celebrex   Imported By: Maryln Gottron 04/24/2010 12:17:41  _____________________________________________________________________  External Attachment:    Type:   Image     Comment:   External Document

## 2010-10-25 NOTE — Progress Notes (Signed)
Summary: Lipitor stock bottle for pick up  Phone Note Outgoing Call   Call placed by: Duard Brady LPN,  November 30, 2009 11:45 AM Call placed to: Patient Summary of Call: [facility] - [facility]  Initial call taken by: Duard Brady LPN,  November 30, 2009 11:45 AM  Follow-up for Phone Call        pt returned call - triage nurse instructed that we had rec'd lipitor stock bottle for pt. - ready for pick up. Follow-up by: Duard Brady LPN,  November 30, 2009 11:47 AM

## 2010-10-25 NOTE — Progress Notes (Signed)
Summary: Pt req order to get Coumedin lvl chckd [facility]  Phone Note Call from Patient Call back at [facility] Phone [phone]   Caller: Patient Summary of Call: Pt called and said that he need Dr. Amador Cunas to order pt to have coumedin checked at [facility] [phone] ask for Bernie Covey Initial call taken by: Lucy Antigua,  August 06, 2010 1:32 PM  Follow-up for Phone Call        to Dr. Frederica Kuster to ok -  Follow-up by: Duard Brady LPN,  August 06, 2010 3:15 PM  Additional Follow-up for Phone Call Additional follow up Details #1::        okay Additional Follow-up by: [name]  MD,  August 06, 2010 5:20 PM    Additional Follow-up for Phone Call Additional follow up Details #2::    spoke with pam t [place] - ok'd pt/INR Follow-up by: Duard Brady LPN,  August 07, 2010 1:39 PM

## 2010-10-25 NOTE — Letter (Signed)
Summary: [facility]  [facility]   Imported By: Maryln Gottron 06/13/2010 14:09:45  _____________________________________________________________________  External Attachment:    Type:   Image     Comment:   External Document

## 2010-10-25 NOTE — Progress Notes (Signed)
Summary: REFILL REQUEST  Phone Note Refill Request Message from:  Patient  Refills Requested: Medication #1:  SUCRALFATE 1 GM TABS qd PT NEEDS REFILL CALL [facility] [phone]  Initial call taken by: Heron Sabins,  June 15, 2010 9:54 AM    Prescriptions: SUCRALFATE 1 GM TABS (SUCRALFATE) qd  #90 x 1   Entered by:   Duard Brady LPN   Authorized by:   [name]  MD   Signed by:   Duard Brady LPN on 11/91/4782   Method used:   Faxed to ...       [facility] (retail)       [address]       [place], [place]  [address]       Ph: [phone] or [phone]       Fax: [phone]   RxID:   [id number]

## 2010-10-25 NOTE — Assessment & Plan Note (Signed)
Summary: 4 month rov/njr  aand we will go  Vital Signs:  Patient profile:   77 year old male Height:      60 inches Weight:      204 pounds BMI:     39.99 Temp:     98.1 degrees F oral Pulse rate:   76 / minute Resp:     14 per minute BP sitting:   130 / 80  (left arm)  Vitals Entered By: Willy Eddy, LPN (July 17, 2010 4:06 PM) d aCC: start back onn coumadin after having colonoscopyand egd for blood loss Is Patient Diabetic? No Pain Assessment Patient in pain? no        CC:  start back onn coumadin after having colonoscopyand egd for blood loss.  History of Present Illness: 77 year old patient who has a history of chronic atrial fibrillation.  He was hospitalized recently for upper GI bleeding secondary to superficial gastric ulcers.  He has had recent GI follow-up and these ulcers have healed.  His Coumadin therapy has been held since the hospital admission.  He has a history of through a vascular disease, treated hypertension, and congestive heart failure.  His status has been stable.  Prior to the hospital admission.  He had been on Celebrex.  He has treated hypertension, which has been stable  Preventive Screening-Counseling & Management  Alcohol-Tobacco     Smoking Status: never  Current Problems (verified): 1)  Gastric Ulcer, Acute, Hemorrhage  (ICD-531.00) 2)  Upper Gastrointestinal Hemorrhage  (ICD-578.9) 3)  Hypotension, Orthostatic  (ICD-458.0) 4)  Congestive Heart Failure  (ICD-428.0) 5)  Chest Pain Unspecified  (ICD-786.50) 6)  Encounter For Therapeutic Drug Monitoring  (ICD-V58.83) 7)  Neuralgia, Trigeminal  (ICD-350.1) 8)  Rectal Bleeding  (ICD-569.3) 9)  Viral Uri  (ICD-465.9) 10)  Hypertension  (ICD-401.9) 11)  Hyperlipidemia  (ICD-272.4) 12)  Gerd  (ICD-530.81) 13)  Coronary Artery Disease  (ICD-414.00) 14)  Ganglion Cyst, Wrist, Left  (ICD-727.41) 15)  Atrial Fibrillation  (ICD-427.31) 16)  Cerebrovascular Accident, Hx of   (ICD-V12.50) 17)  Hypertension  (ICD-401.9) 18)  Hyperlipidemia  (ICD-272.4) 19)  Gerd  (ICD-530.81) 20)  Family History Diabetes 1st Degree Relative  (ICD-V18.0)  Current Medications (verified): 1)  Lipitor 40 Mg  Tabs (Atorvastatin Calcium) .Marland Kitchen.. 1 Once Daily 2)  Nexium 40 Mg  Cpdr (Esomeprazole Magnesium) .Marland Kitchen.. 1 Once Daily 3)  Ambien 5 Mg Tabs (Zolpidem Tartrate) .... One By Mouth Q Hs For Insomnia 4)  Colace 100 Mg Caps (Docusate Sodium) .... One Two Times A Day. 5)  Carbamazepine 200 Mg Tabs (Carbamazepine) .... One Half Tablet  Twice Daily 6)  Durezol 0.05 % Emul (Difluprednate) .... Both Eyes 1-2 Times Qd 7)  Lumigan 0.01 % Soln (Bimatoprost) .... Both Eyes Qhs 8)  Calcium-Vitamin D 600-200 Mg-Unit Tabs (Calcium-Vitamin D) .... Qd 9)  Proventil Hfa 108 (90 Base) Mcg/act Aers (Albuterol Sulfate) .... As Directed 10)  Tylenol 325 Mg Tabs (Acetaminophen) .... Prn 11)  Vitamin C 500 Mg Tabs (Ascorbic Acid) .... Qd 12)  Tramadol Hcl 50 Mg Tabs (Tramadol Hcl) .... One Every 6 Hours As Needed For Pain  Allergies (verified): 1)  ! Sulfa 2)  ! Codeine  Past History:  Past Medical History: Reviewed history from 05/22/2010 and no changes required. Coronary artery disease GERD Hyperlipidemia Hypertension Atrial fibrillation trigeminal neuralgia history of congestive heart failure Congestive heart failure, history of history of NSAID-induced gastric ulcers, August 2011  Past Surgical History: Reviewed history from 05/22/2010  and no changes required. L Knee Surgery L Shoulder Surgery Cholecystectomy status post right total hip replacement surgery  August 2008 colonoscopy in  2002 status post left knee replacement surgery June 2009 EGD and colonoscopy August 2011  Review of Systems  The patient denies anorexia, fever, weight loss, weight gain, vision loss, decreased hearing, hoarseness, chest pain, syncope, dyspnea on exertion, peripheral edema, prolonged cough, headaches,  hemoptysis, abdominal pain, melena, severe indigestion/heartburn, hematuria, incontinence, genital sores, muscle weakness, suspicious skin lesions, transient blindness, difficulty walking, depression, unusual weight change, abnormal bleeding, enlarged lymph nodes, angioedema, breast masses, and testicular masses.    Physical Exam  General:  overweight-appearing.  120/80overweight-appearing.   Head:  Normocephalic and atraumatic without obvious abnormalities. No apparent alopecia or balding. Mouth:  Oral mucosa and oropharynx without lesions or exudates.  Teeth in good repair. Neck:  No deformities, masses, or tenderness noted. Lungs:  few crackles, right base Heart:  irregular rhythm, with a controlled ventricular response Abdomen:  Bowel sounds positive,abdomen soft and non-tender without masses, organomegaly or hernias noted. Extremities:  no edema   Impression & Recommendations:  Problem # 1:  CONGESTIVE HEART FAILURE (ICD-428.0)  His updated medication list for this problem includes:    Warfarin Sodium 2 Mg Tabs (Warfarin sodium) .Marland Kitchen... Take one tablet tuesdays and fridays, and two tablets the other 5 days of the week  His updated medication list for this problem includes:    Warfarin Sodium 2 Mg Tabs (Warfarin sodium) .Marland Kitchen... Take one tablet tuesdays and fridays, and two tablets the other 5 days of the week  Problem # 2:  HYPERTENSION (ICD-401.9)  Problem # 3:  ATRIAL FIBRILLATION (ICD-427.31)  His updated medication list for this problem includes:    Warfarin Sodium 2 Mg Tabs (Warfarin sodium) .Marland Kitchen... Take one tablet tuesdays and fridays, and two tablets the other 5 days of the week  His updated medication list for this problem includes:    Warfarin Sodium 2 Mg Tabs (Warfarin sodium) .Marland Kitchen... Take one tablet tuesdays and fridays, and two tablets the other 5 days of the week  Problem # 4:  UPPER GASTROINTESTINAL HEMORRHAGE (ICD-578.9)  Complete Medication List: 1)  Lipitor 40 Mg  Tabs (Atorvastatin calcium) .Marland Kitchen.. 1 once daily 2)  Nexium 40 Mg Cpdr (Esomeprazole magnesium) .Marland Kitchen.. 1 once daily 3)  Ambien 5 Mg Tabs (Zolpidem tartrate) .... One by mouth q hs for insomnia 4)  Colace 100 Mg Caps (Docusate sodium) .... One two times a day. 5)  Carbamazepine 200 Mg Tabs (Carbamazepine) .... One half tablet  twice daily 6)  Durezol 0.05 % Emul (Difluprednate) .... Both eyes 1-2 times qd 7)  Lumigan 0.01 % Soln (Bimatoprost) .... Both eyes qhs 8)  Calcium-vitamin D 600-200 Mg-unit Tabs (Calcium-vitamin d) .... Qd 9)  Proventil Hfa 108 (90 Base) Mcg/act Aers (Albuterol sulfate) .... As directed 10)  Tylenol 325 Mg Tabs (Acetaminophen) .... Prn 11)  Vitamin C 500 Mg Tabs (Ascorbic acid) .... Qd 12)  Tramadol Hcl 50 Mg Tabs (Tramadol hcl) .... One every 6 hours as needed for pain 13)  Warfarin Sodium 2 Mg Tabs (Warfarin sodium) .... Take one tablet tuesdays and fridays, and two tablets the other 5 days of the week  Patient Instructions: 1)  Please schedule a follow-up appointment in 3 months. 2)  Limit your Sodium (Salt). 3)  It is important that you exercise regularly at least 20 minutes 5 times a week. If you develop chest pain, have severe difficulty breathing, or feel  very tired , stop exercising immediately and seek medical attention. 4)  You need to lose weight. Consider a lower calorie diet and regular exercise.  Prescriptions: WARFARIN SODIUM 2 MG TABS (WARFARIN SODIUM) take one tablet Tuesdays and Fridays, and two tablets the other 5 days of the week  #90 x 5   Entered and Authorized by:   [name]  MD   Signed by:   [name]  MD on 07/17/2010   Method used:   Electronically to        [facility]. [phone]* (retail)       [address]       [place], [place]  [address]       Ph: [phone] or [phone]       Fax: [phone]   RxID:   [id number] NEXIUM 40 MG  CPDR (ESOMEPRAZOLE MAGNESIUM) 1 once daily   #100 x 6   Entered and Authorized by:   [name]  MD   Signed by:   [name]  MD on 07/17/2010   Method used:   Electronically to        [facility]. [phone]* (retail)       [address]       [place], [place]  [address]       Ph: [phone] or [phone]       Fax: [phone]   RxID:   [id number] LIPITOR 40 MG  TABS (ATORVASTATIN CALCIUM) 1 once daily  #100 x 6   Entered and Authorized by:   [name]  MD   Signed by:   [name]  MD on 07/17/2010   Method used:   Electronically to        [facility]. [phone]* (retail)       [address]       [place], [place]  [address]       Ph: [phone] or [phone]       Fax: [phone]   RxID:   [id number]    Orders Added: 1)  Est. Patient Level IV [38101]

## 2010-10-25 NOTE — Progress Notes (Signed)
Summary: labs sent to dr edwards  Phone Note Call from Patient Call back at Home Phone [phone]   Caller: Patient Call For: [name]  MD Complaint: Urinary/GYN Problems Summary of Call: pt would like kim to return his call today please. Initial call taken by: Heron Sabins,  July 03, 2010 2:21 PM  Follow-up for Phone Call        called pt to discuss labs that were sent to Dr. Randa Evens. KIK Follow-up by: Duard Brady LPN,  July 03, 2010 2:35 PM

## 2010-10-25 NOTE — Progress Notes (Signed)
  Phone Note Call from Patient Call back at Home Phone [phone]   Caller: Patient Call For: [name]  MD Summary of Call: Pt called to see if going off of the Carbamezine 200 mg. would make him more SOB?  Initial call taken by: [facility] CMA AAMA,  October 02, 2010 1:25 PM  Follow-up for Phone Call        no- ROV this week if worsens  Follow-up by: [name]  MD,  October 02, 2010 2:01 PM  Additional Follow-up for Phone Call Additional follow up Details #1::        Pt aware. Additional Follow-up by: Lynann Beaver CMA AAMA,  October 02, 2010 2:41 PM

## 2010-10-25 NOTE — Progress Notes (Signed)
Summary: celebrex for pick up  Phone Note Outgoing Call   Call placed by: Duard Brady LPN,  April 18, 2010 1:01 PM Call placed to: Patient Summary of Call: celebrex delivered here - ready for pick. - pt aware. KIK Initial call taken by: Duard Brady LPN,  April 18, 2010 1:02 PM

## 2010-10-25 NOTE — Progress Notes (Signed)
Summary: last bloodwork result call to dr edwards  Phone Note Call from Patient Call back at Home Phone [phone]   Caller: Patient Call For: [name]  MD Summary of Call: pt would like last bloodwork results call to dr edwards [phone] Initial call taken by: Heron Sabins,  June 25, 2010 2:33 PM  Follow-up for Phone Call        faxed last cbc and pt/inr to dr. Randa Evens - pt aware. KIK Follow-up by: Duard Brady LPN,  June 25, 2010 3:17 PM     Appended Document: last bloodwork result call to dr Randa Evens corrected faxed # [phone] [facility]

## 2010-10-25 NOTE — Letter (Signed)
Summary: Generic Letter  [facility]  [address] [place], [place] [address]   Phone: [phone]  Fax: [phone]    05/15/2010  [facility] [address] [place], [place]  [address]  Dear [name],           Sincerely,   Duard Brady LPN  Appended Document: Generic Letter printed in error -   sent release of info to [facility] for notes,labs, xrays, h/p , dc summary from 8/18/ to present. KIK

## 2010-10-25 NOTE — Assessment & Plan Note (Signed)
Summary: STOMACH ISSUES/NUMBNESS IN FACE/CJR   Vital Signs:  Patient profile:   77 year old male Weight:      218 pounds Temp:     98.0 degrees F oral BP sitting:   120 / 78  (right arm) Cuff size:   regular  Vitals Entered By: Duard Brady LPN (March 20, 2010 2:33 PM) CC: c/o mid chest pain Is Patient Diabetic? No   CC:  c/o mid chest pain.  History of Present Illness: 77 year old patient who is seen today for follow up.  He has chronic atrial fibrillation, on chronic Coumadin.  He states that he has had some epigastric discomfort since self discontinuation of Nexium two months ago.  He has a history of trigeminal neuralgia, which has been stable on low-dose Tegretol.  He has coronary artery disease, which has been stable.  He has treated hypertension, and dyslipidemia  Allergies: 1)  ! Sulfa 2)  ! Codeine  Past History:  Past Medical History: Reviewed history from 10/09/2009 and no changes required. Coronary artery disease GERD Hyperlipidemia Hypertension Atrial fibrillation trigeminal neuralgia  Past Surgical History: Reviewed history from 04/22/2008 and no changes required. L Knee Surgery L Shoulder Surgery Cholecystectomy status post right total hip replacement surgery  August 2008 colonoscopy in  2002 status post left knee replacement surgery June 2009  Review of Systems       The patient complains of abdominal pain.  The patient denies anorexia, fever, weight loss, weight gain, vision loss, decreased hearing, hoarseness, chest pain, syncope, dyspnea on exertion, peripheral edema, prolonged cough, headaches, hemoptysis, melena, hematochezia, severe indigestion/heartburn, hematuria, incontinence, genital sores, muscle weakness, suspicious skin lesions, transient blindness, difficulty walking, depression, unusual weight change, abnormal bleeding, enlarged lymph nodes, angioedema, breast masses, and testicular masses.    Physical Exam  General:   overweight-appearing.  normal blood pressure Head:  Normocephalic and atraumatic without obvious abnormalities. No apparent alopecia or balding. Eyes:  No corneal or conjunctival inflammation noted. EOMI. Perrla. Funduscopic exam benign, without hemorrhages, exudates or papilledema. Vision grossly normal. Mouth:  Oral mucosa and oropharynx without lesions or exudates.  Teeth in good repair. Neck:  No deformities, masses, or tenderness noted. Lungs:  Normal respiratory effort, chest expands symmetrically. Lungs are clear to auscultation, no crackles or wheezes. Heart:  irregular rhythm with controlled ventricular response Abdomen:  mild epigastric tenderness Msk:  No deformity or scoliosis noted of thoracic or lumbar spine.   Pulses:  R and L carotid,radial,femoral,dorsalis pedis and posterior tibial pulses are full and equal bilaterally Extremities:  No clubbing, cyanosis, edema, or deformity noted with normal full range of motion of all joints.     Impression & Recommendations:  Problem # 1:  HYPERTENSION (ICD-401.9)  His updated medication list for this problem includes:    Furosemide 40 Mg Tabs (Furosemide) .Marland Kitchen... 1 by mouth once daily    Atenolol 25 Mg Tabs (Atenolol) .Marland Kitchen... 1 once daily  Orders: EKG w/ Interpretation ([address])  Problem # 2:  HYPERLIPIDEMIA (ICD-272.4)  His updated medication list for this problem includes:    Lipitor 40 Mg Tabs (Atorvastatin calcium) .Marland Kitchen... 1 once daily  Orders: EKG w/ Interpretation ([address])  Problem # 3:  CORONARY ARTERY DISEASE (ICD-414.00)  His updated medication list for this problem includes:    Furosemide 40 Mg Tabs (Furosemide) .Marland Kitchen... 1 by mouth once daily    Atenolol 25 Mg Tabs (Atenolol) .Marland Kitchen... 1 once daily  Orders: EKG w/ Interpretation ([address])  Problem # 4:  ATRIAL FIBRILLATION (ICD-427.31)  His updated medication list for this problem includes:    Digoxin 0.125 Mg Tabs (Digoxin) .Marland Kitchen... 1 once daily    Coumadin 4 Mg Tabs (Warfarin  sodium) .Marland Kitchen... 2 mg every tuesday, and friday, and 4 mg 5 times per week    Atenolol 25 Mg Tabs (Atenolol) .Marland Kitchen... 1 once daily  Orders: EKG w/ Interpretation ([address])  Problem # 5:  GERD (ICD-530.81)  His updated medication list for this problem includes:    Nexium 40 Mg Cpdr (Esomeprazole magnesium) .Marland Kitchen... 1 once daily  Complete Medication List: 1)  Celebrex 200 Mg Caps (Celecoxib) .... Once daily 2)  Lipitor 40 Mg Tabs (Atorvastatin calcium) .Marland Kitchen.. 1 once daily 3)  Digoxin 0.125 Mg Tabs (Digoxin) .Marland Kitchen.. 1 once daily 4)  Coumadin 4 Mg Tabs (Warfarin sodium) .... 2 mg every tuesday, and friday, and 4 mg 5 times per week 5)  Nexium 40 Mg Cpdr (Esomeprazole magnesium) .Marland Kitchen.. 1 once daily 6)  Ambien 5 Mg Tabs (Zolpidem tartrate) .... One by mouth q hs for insomnia 7)  Furosemide 40 Mg Tabs (Furosemide) .Marland Kitchen.. 1 by mouth once daily 8)  Anusol-hc 2.5 % Crea (Hydrocortisone) .... Use four times a day 9)  Colace 100 Mg Caps (Docusate sodium) .... One two times a day. 10)  Atenolol 25 Mg Tabs (Atenolol) .Marland Kitchen.. 1 once daily 11)  Carbamazepine 200 Mg Tabs (Carbamazepine) .... One half tablet  twice daily 12)  Durezol 0.05 % Emul (Difluprednate) .... Both eyes 1-2 times qd 13)  Lumigan 0.01 % Soln (Bimatoprost) .... Both eyes qhs 14)  Calcium-vitamin D 600-200 Mg-unit Tabs (Calcium-vitamin d) .... Qd  Patient Instructions: 1)  Please schedule a follow-up appointment in 4 months. 2)  Limit your Sodium (Salt). 3)  Avoid foods high in acid (tomatoes, citrus juices, spicy foods). Avoid eating within two hours of lying down or before exercising. Do not over eat; try smaller more frequent meals. Elevate head of bed twelve inches when sleeping. 4)  It is important that you exercise regularly at least 20 minutes 5 times a week. If you develop chest pain, have severe difficulty breathing, or feel very tired , stop exercising immediately and seek medical attention.

## 2010-10-25 NOTE — Miscellaneous (Signed)
Summary: Discharge [facility]  Discharge [facility]   Imported By: Maryln Gottron 06/27/2010 12:45:35  _____________________________________________________________________  External Attachment:    Type:   Image     Comment:   External Document

## 2010-10-25 NOTE — Assessment & Plan Note (Signed)
Summary: 1 month follow up/cjr/pt rescd//ccm   Vital Signs:  Patient profile:   77 year old male Weight:      208 pounds Temp:     97.7 degrees F oral BP sitting:   102 / 70  (left arm) Cuff size:   regular  Vitals Entered By: Duard Brady LPN (June 08, 2010 3:07 PM) CC: 1 mos rov - doing well Is Patient Diabetic? No Flu Vaccine Consent Questions     Do you have a history of severe allergic reactions to this vaccine? no    Any prior history of allergic reactions to egg and/or gelatin? no    Do you have a sensitivity to the preservative Thimersol? no    Do you have a past history of Guillan-Barre Syndrome? no    Do you currently have an acute febrile illness? no    Have you ever had a severe reaction to latex? no    Vaccine information given and explained to patient? yes    Are you currently pregnant? no    Lot Number:[id number]   Exp Date:03/23/2011   Site Given  Left Deltoid IM    CC:  1 mos rov - doing well.  History of Present Illness: 73 -year-old patient who is seen today for follow-up of upper gastrointestinal bleeding secondary to gastric ulcer  disease.  he is doing  quite well.  He is scheduled for GI follow up in one month and cardiology follow-up in one week.  His cardiac status has been stable.  He is been maintained off Coumadin therapy until improvement of anemia and documentation of healing of the gastric ulcers.  He denies any cardiopulmonary complaints.  He remains on supplemental iron.  Last hemoglobin 9.9 g percent.  Two weeks ago  Allergies: 1)  ! Sulfa 2)  ! Codeine  Past History:  Past Medical History: Reviewed history from 05/22/2010 and no changes required. Coronary artery disease GERD Hyperlipidemia Hypertension Atrial fibrillation trigeminal neuralgia history of congestive heart failure Congestive heart failure, history of history of NSAID-induced gastric ulcers, August 2011  Physical Exam  General:  overweight-appearing.   blood pressure  low normaloverweight-appearing.   Head:  Normocephalic and atraumatic without obvious abnormalities. No apparent alopecia or balding. Eyes:  No corneal or conjunctival inflammation noted. EOMI. Perrla. Funduscopic exam benign, without hemorrhages, exudates or papilledema. Vision grossly normal. Mouth:  Oral mucosa and oropharynx without lesions or exudates.  Teeth in good repair. Neck:  No deformities, masses, or tenderness noted. Lungs:  Normal respiratory effort, chest expands symmetrically. Lungs are clear to auscultation, no crackles or wheezes. Heart:  Normal rate and regular rhythm. S1 and S2 normal without gallop, murmur, click, rub or other extra sounds. Abdomen:  Bowel sounds positive,abdomen soft and non-tender without masses, organomegaly or hernias noted. Msk:  No deformity or scoliosis noted of thoracic or lumbar spine.     Impression & Recommendations:  Problem # 1:  GASTRIC ULCER, ACUTE, HEMORRHAGE (ICD-531.00)  His updated medication list for this problem includes:    Nexium 40 Mg Cpdr (Esomeprazole magnesium) .Marland Kitchen... 1 once daily    Sucralfate 1 Gm Tabs (Sucralfate) ..... Qd    His updated medication list for this problem includes:    Nexium 40 Mg Cpdr (Esomeprazole magnesium) .Marland Kitchen... 1 once daily    Sucralfate 1 Gm Tabs (Sucralfate) ..... Qd  Problem # 2:  UPPER GASTROINTESTINAL HEMORRHAGE (ICD-578.9)  Problem # 3:  HYPERTENSION (ICD-401.9)  Complete Medication List: 1)  Lipitor 40 Mg  Tabs (Atorvastatin calcium) .Marland Kitchen.. 1 once daily 2)  Nexium 40 Mg Cpdr (Esomeprazole magnesium) .Marland Kitchen.. 1 once daily 3)  Ambien 5 Mg Tabs (Zolpidem tartrate) .... One by mouth q hs for insomnia 4)  Colace 100 Mg Caps (Docusate sodium) .... One two times a day. 5)  Carbamazepine 200 Mg Tabs (Carbamazepine) .... One half tablet  twice daily 6)  Durezol 0.05 % Emul (Difluprednate) .... Both eyes 1-2 times qd 7)  Lumigan 0.01 % Soln (Bimatoprost) .... Both eyes qhs 8)   Calcium-vitamin D 600-200 Mg-unit Tabs (Calcium-vitamin d) .... Qd 9)  Proventil Hfa 108 (90 Base) Mcg/act Aers (Albuterol sulfate) .... As directed 10)  Tylenol 325 Mg Tabs (Acetaminophen) .... Prn 11)  Vitamin C 500 Mg Tabs (Ascorbic acid) .... Qd 12)  Sucralfate 1 Gm Tabs (Sucralfate) .... Qd 13)  Tramadol Hcl 50 Mg Tabs (Tramadol hcl) .... One every 6 hours as needed for pain  Other Orders: Flu Vaccine 35yrs + MEDICARE PATIENTS (Z6109) Administration Flu vaccine - MCR (G0008) Venipuncture (60454) TLB-CBC Platelet - w/Differential (85025-CBCD)  Patient Instructions: 1)  Please schedule a follow-up appointment in 1 month. 2)  Limit your Sodium (Salt) to less than 2 grams a day(slightly less than 1/2 a teaspoon) to prevent fluid retention, swelling, or worsening of symptoms. 3)  continue iron therapy Prescriptions: TRAMADOL HCL 50 MG TABS (TRAMADOL HCL) one every 6 hours as needed for pain  #90 x 6   Entered and Authorized by:   [name]  MD   Signed by:   [name]  MD on 06/08/2010   Method used:   Electronically to        [facility] [phone]* (retail)       [address]       [place], [place]  [address]       Ph: [phone] or [phone]       Fax: [phone]   RxID:   [id number]

## 2010-10-25 NOTE — Assessment & Plan Note (Signed)
Summary: 2 week follow up/cjr   Vital Signs:  Patient profile:   77 year old male Weight:      212 pounds Temp:     98.6 degrees F oral BP sitting:   104 / 58  (left arm) Cuff size:   regular  Vitals Entered By: Raechel Ache, RN (October 09, 2009 2:48 PM) CC: 2 week f/u.   CC:  2 week f/u.Marland Kitchen  History of Present Illness: 77 year old patient who was placed on Tegretol two weeks ago due to frequent severe any facial pain.  He has had a nice clinical response earlier did have some weakness and dizziness.  He cut his dose by one half.  At the present time.  He is doing quite well without side effects.  He is on chronic Coumadin and was seen today for a follow-up INR, as well as his clinical follow-up  Allergies: 1)  ! Sulfa 2)  ! Codeine  Past History:  Past Medical History: Coronary artery disease GERD Hyperlipidemia Hypertension Atrial fibrillation trigeminal neuralgia  Review of Systems       The patient complains of headaches.  The patient denies anorexia, fever, weight loss, weight gain, vision loss, decreased hearing, hoarseness, chest pain, syncope, dyspnea on exertion, peripheral edema, prolonged cough, hemoptysis, abdominal pain, melena, hematochezia, severe indigestion/heartburn, hematuria, incontinence, genital sores, muscle weakness, suspicious skin lesions, transient blindness, difficulty walking, depression, unusual weight change, abnormal bleeding, enlarged lymph nodes, angioedema, breast masses, and testicular masses.    Physical Exam  General:  Well-developed,well-nourished,in no acute distress; alert,appropriate and cooperative throughout examination Head:  Normocephalic and atraumatic without obvious abnormalities. No apparent alopecia or balding. Eyes:  No corneal or conjunctival inflammation noted. EOMI. Perrla. Funduscopic exam benign, without hemorrhages, exudates or papilledema. Vision grossly normal. Mouth:  Oral mucosa and oropharynx without lesions or  exudates.  Teeth in good repair. Neck:  No deformities, masses, or tenderness noted. Lungs:  Normal respiratory effort, chest expands symmetrically. Lungs are clear to auscultation, no crackles or wheezes. Heart:  Normal rate and regular rhythm. S1 and S2 normal without gallop, murmur, click, rub or other extra sounds.   Impression & Recommendations:  Problem # 1:  NEURALGIA, TRIGEMINAL (ICD-350.1) nice clinical response on low-dose Tegretol  Problem # 2:  HYPERTENSION (ICD-401.9)  His updated medication list for this problem includes:    Furosemide 40 Mg Tabs (Furosemide) .Marland Kitchen... 1 by mouth once daily    Atenolol 25 Mg Tabs (Atenolol) .Marland Kitchen... 1 once daily  His updated medication list for this problem includes:    Furosemide 40 Mg Tabs (Furosemide) .Marland Kitchen... 1 by mouth once daily    Atenolol 25 Mg Tabs (Atenolol) .Marland Kitchen... 1 once daily  Complete Medication List: 1)  Celebrex 200 Mg Caps (Celecoxib) .... Once daily 2)  Lipitor 40 Mg Tabs (Atorvastatin calcium) .Marland Kitchen.. 1 once daily 3)  Digoxin 0.125 Mg Tabs (Digoxin) .Marland Kitchen.. 1 once daily 4)  Coumadin 4 Mg Tabs (Warfarin sodium) .... 2 mg every tuesday, and friday, and 4 mg 5 times per week 5)  Nexium 40 Mg Cpdr (Esomeprazole magnesium) .Marland Kitchen.. 1 once daily 6)  Ambien 5 Mg Tabs (Zolpidem tartrate) .... One by mouth q hs for insomnia 7)  Furosemide 40 Mg Tabs (Furosemide) .Marland Kitchen.. 1 by mouth once daily 8)  Anusol-hc 2.5 % Crea (Hydrocortisone) .... Use four times a day 9)  Colace 100 Mg Caps (Docusate sodium) .... One two times a day. 10)  Atenolol 25 Mg Tabs (Atenolol) .Marland KitchenMarland KitchenMarland Kitchen  1 once daily 11)  Carbamazepine 200 Mg Tabs (Carbamazepine) .... One half tablet  twice daily  Other Orders: Venipuncture (54098) TLB-CBC Platelet - w/Differential (85025-CBCD) Protime (11914NW)  Patient Instructions: 1)  Please schedule a follow-up appointment in 3 months. 2)  Limit your Sodium (Salt) to less than 2 grams a day(slightly less than 1/2 a teaspoon) to prevent fluid  retention, swelling, or worsening of symptoms. 3)  It is important that you exercise regularly at least 20 minutes 5 times a week. If you develop chest pain, have severe difficulty breathing, or feel very tired , stop exercising immediately and seek medical attention. Prescriptions: CARBAMAZEPINE 200 MG TABS (CARBAMAZEPINE) one half tablet  twice daily  #90 x 4   Entered and Authorized by:   [name]  MD   Signed by:   [name]  MD on 10/09/2009   Method used:   Print then Give to Patient   RxID:   [id number]   Appended Document: 2 week follow up/cjr   ANTICOAGULATION RECORD  NEW REGIMEN & LAB RESULTS Current INR: 2.0 Regimen: same  Repeat testing in: 1 month  Anticoagulation Visit Questionnaire Coumadin dose missed/changed:  No Abnormal Bleeding Symptoms:  No  Any diet changes including alcohol intake, vegetables or greens since the last visit:  No Any illnesses or hospitalizations since the last visit:  No Any signs of clotting since the last visit (including chest discomfort, dizziness, shortness of breath, arm tingling, slurred speech, swelling or redness in leg):  No  MEDICATIONS CELEBREX 200 MG  CAPS (CELECOXIB) once daily LIPITOR 40 MG  TABS (ATORVASTATIN CALCIUM) 1 once daily DIGOXIN 0.125 MG  TABS (DIGOXIN) 1 once daily COUMADIN 4 MG  TABS (WARFARIN SODIUM) 2 mg every Tuesday, and Friday, and 4 mg 5 times per week NEXIUM 40 MG  CPDR (ESOMEPRAZOLE MAGNESIUM) 1 once daily AMBIEN 5 MG TABS (ZOLPIDEM TARTRATE) one by mouth q hs for insomnia FUROSEMIDE 40 MG TABS (FUROSEMIDE) 1 by mouth once daily ANUSOL-HC 2.5 % CREA (HYDROCORTISONE) Use four times a day COLACE 100 MG CAPS (DOCUSATE SODIUM) One two times a day. ATENOLOL 25 MG TABS (ATENOLOL) 1 once daily CARBAMAZEPINE 200 MG TABS (CARBAMAZEPINE) one half tablet  twice daily    Laboratory Results   Blood Tests     PT: 17.3 s   (Normal Range: 10.6-13.4)  INR: 2.0   (Normal Range:  0.88-1.12   Therap INR: 2.0-3.5) Comments: Rita Ohara  October 09, 2009 3:41 PM

## 2010-10-25 NOTE — Assessment & Plan Note (Signed)
Summary: URI? // RS    Vital Signs:  Patient profile:   77 year old male Weight:      210 pounds Temp:     98.3 degrees F oral Pulse rate:   72 / minute BP sitting:   130 / 80  (left arm) Cuff size:   regular  Vitals Entered By: Romualdo Bolk, CMA (AAMA) (September 13, 2010 4:07 PM) CC: Runny nose, coughing and congestion x 3 days   CC:  Runny nose and coughing and congestion x 3 days.  History of Present Illness: 14 -year-old patient who presents with a 3 to 4 day history of rhinorrhea, cough, and sneezing.  His been no documented fever or purulent productive cough.  He denies any chest pain or shortness of breath.  He does have a history of congestive heart failure, hypertension, and dyslipidemia.  He remains on chronic anticoagulation therapy.  Preventive Screening-Counseling & Management  Alcohol-Tobacco     Smoking Status: never  Caffeine-Diet-Exercise     Does Patient Exercise: no  Current Medications (verified): 1)  Lipitor 40 Mg  Tabs (Atorvastatin Calcium) .Marland Kitchen.. 1 Once Daily 2)  Nexium 40 Mg  Cpdr (Esomeprazole Magnesium) .Marland Kitchen.. 1 Once Daily 3)  Ambien 5 Mg Tabs (Zolpidem Tartrate) .... One By Mouth Q Hs For Insomnia 4)  Colace 100 Mg Caps (Docusate Sodium) .... One Two Times A Day. 5)  Carbamazepine 200 Mg Tabs (Carbamazepine) .... One Half Tablet  Twice Daily 6)  Durezol 0.05 % Emul (Difluprednate) .... Both Eyes 1-2 Times Qd 7)  Lumigan 0.01 % Soln (Bimatoprost) .... Both Eyes Qhs 8)  Calcium-Vitamin D 600-200 Mg-Unit Tabs (Calcium-Vitamin D) .... Qd 9)  Proventil Hfa 108 (90 Base) Mcg/act Aers (Albuterol Sulfate) .... As Directed 10)  Tylenol 325 Mg Tabs (Acetaminophen) .... Prn 11)  Vitamin C 500 Mg Tabs (Ascorbic Acid) .... Qd 12)  Tramadol Hcl 50 Mg Tabs (Tramadol Hcl) .... One Every 6 Hours As Needed For Pain 13)  Coumadin 2 Mg Tabs (Warfarin Sodium) .... 2 By Mouth Once Daily 14)  Coumadin 1 Mg Tabs (Warfarin Sodium) .Marland Kitchen.. 1 By Mouth Once Daily 15)   Coumadin 5 Mg Tabs (Warfarin Sodium) .... Once Daily As Directed  Allergies (verified): 1)  ! Sulfa 2)  ! Codeine  Past History:  Past Medical History: Reviewed history from 05/22/2010 and no changes required. Coronary artery disease GERD Hyperlipidemia Hypertension Atrial fibrillation trigeminal neuralgia history of congestive heart failure Congestive heart failure, history of history of NSAID-induced gastric ulcers, August 2011  Review of Systems       The patient complains of anorexia, hoarseness, and prolonged cough.  The patient denies fever, weight loss, weight gain, vision loss, decreased hearing, chest pain, syncope, dyspnea on exertion, peripheral edema, headaches, hemoptysis, abdominal pain, melena, hematochezia, severe indigestion/heartburn, hematuria, incontinence, genital sores, muscle weakness, suspicious skin lesions, transient blindness, difficulty walking, depression, unusual weight change, abnormal bleeding, enlarged lymph nodes, angioedema, breast masses, and testicular masses.    Physical Exam  General:  overweight-appearing.  normal blood pressure.  No acute distress. Head:  Normocephalic and atraumatic without obvious abnormalities. No apparent alopecia or balding. Eyes:  No corneal or conjunctival inflammation noted. EOMI. Perrla. Funduscopic exam benign, without hemorrhages, exudates or papilledema. Vision grossly normal. Mouth:  Oral mucosa and oropharynx without lesions or exudates.  Teeth in good repair. Neck:  No deformities, masses, or tenderness noted. Lungs:  a few bibasilar ralesnormal respiratory effort, no intercostal retractions, and no accessory muscle  use.   Heart:  irregular rhythm with a controlled ventricular response Abdomen:  obese soft and nontender Msk:  no edema   Impression & Recommendations:  Problem # 1:  VIRAL URI (ICD-465.9)  His updated medication list for this problem includes:    Tylenol 325 Mg Tabs (Acetaminophen) .Marland Kitchen...  Prn    Allegra Allergy 180 Mg Tabs (Fexofenadine hcl) ..... One daily    Benzonatate 100 Mg Caps (Benzonatate) ..... One every 8 hours  Problem # 2:  HYPERTENSION (ICD-401.9)  Problem # 3:  CORONARY ARTERY DISEASE (ICD-414.00)  Complete Medication List: 1)  Lipitor 40 Mg Tabs (Atorvastatin calcium) .Marland Kitchen.. 1 once daily 2)  Nexium 40 Mg Cpdr (Esomeprazole magnesium) .Marland Kitchen.. 1 once daily 3)  Ambien 5 Mg Tabs (Zolpidem tartrate) .... One by mouth q hs for insomnia 4)  Colace 100 Mg Caps (Docusate sodium) .... One two times a day. 5)  Carbamazepine 200 Mg Tabs (Carbamazepine) .... One half tablet  twice daily 6)  Durezol 0.05 % Emul (Difluprednate) .... Both eyes 1-2 times qd 7)  Lumigan 0.01 % Soln (Bimatoprost) .... Both eyes qhs 8)  Calcium-vitamin D 600-200 Mg-unit Tabs (Calcium-vitamin d) .... Qd 9)  Proventil Hfa 108 (90 Base) Mcg/act Aers (Albuterol sulfate) .... As directed 10)  Tylenol 325 Mg Tabs (Acetaminophen) .... Prn 11)  Vitamin C 500 Mg Tabs (Ascorbic acid) .... Qd 12)  Tramadol Hcl 50 Mg Tabs (Tramadol hcl) .... One every 6 hours as needed for pain 13)  Coumadin 5 Mg Tabs (Warfarin sodium) .... One and 1/2 tab tues and thurs and one 5 days a week 14)  Allegra Allergy 180 Mg Tabs (Fexofenadine hcl) .... One daily 15)  Benzonatate 100 Mg Caps (Benzonatate) .... One every 8 hours  Patient Instructions: 1)  Get plenty of rest, drink lots of clear liquids, and use Tylenol  for fever and comfort. Return in 7-10 days if you're not better:sooner if you're feeling worse. Prescriptions: COUMADIN 5 MG TABS (WARFARIN SODIUM) one and 1/2 tab Tues and Thurs and one 5 days a week  #90 x 2   Entered and Authorized by:   [name]  MD   Signed by:   [name]  MD on 09/13/2010   Method used:   Electronically to        [facility]. [phone]* (retail)       [address]       [place], [place]  [address]       Ph: [phone] or  [phone]       Fax: [phone]   RxID:   [id number] BENZONATATE 100 MG CAPS (BENZONATATE) one every 8 hours  #20 x 0   Entered and Authorized by:   [name]  MD   Signed by:   [name]  MD on 09/13/2010   Method used:   Electronically to        [facility]. [phone]* (retail)       [address]       [place], [place]  [address]       Ph: [phone] or [phone]       Fax: [phone]   RxID:   [id number] HCWCBJS ALLERGY 180 MG TABS (FEXOFENADINE HCL) one daily  #10 x 0   Entered and Authorized by:   [name]  MD   Signed by:  [name]  MD on 09/13/2010   Method used:   Electronically to        [facility]. [phone]* (retail)       [address]       [place], [place]  [address]       Ph: [phone] or [phone]       Fax: [phone]   RxID:   [id number]    Orders Added: 1)  Est. Patient Level III [474[address]]

## 2010-10-25 NOTE — Progress Notes (Signed)
Summary: coumadin Eric [name]  Phone Note Call from Patient   Caller: Patient Call For: [name]  MD Summary of Call: recv'd call from pt - has not stopped coumadin - has hand surg scheduled for Wed.  Initial call taken by: Duard Brady LPN,  December 11, 2009 3:18 PM  Follow-up for Phone Call        after speaking with Dr. Amador Cunas - will probably have to r/s surg - but pt needs to contact surgeon and inform.   Follow-up by: Duard Brady LPN,  December 11, 2009 3:19 PM  Additional Follow-up for Phone Call Additional follow up Details #1::        spoke with pt - last dose taken last night , will need to contact surgeon to inform and let him make the call on weither or not to r/s surg. Verbalized understanding. KIK Additional Follow-up by: Duard Brady LPN,  December 11, 2009 3:20 PM

## 2010-10-25 NOTE — Progress Notes (Signed)
Summary: QUESTION CONCERNING MED    Caller: Patient  [phone] Summary of Call: Pt called to speak with RN or Dr Kirtland Bouchard.... Pt has question concerning med (Coumadin).... Would like a return call to  [phone] please.  Pt states he has left msg on Kim's v/m as well. Initial call taken by: Debbra Riding,  August 10, 2010 4:51 PM    Additional Follow-up for Phone Call Additional follow up Details #2::    spoke with pt - he states that he spoke with dr. Amador Cunas after we had spoken on friday - per Dr, Amador Cunas it would be ok for pt to take 4mg  daily labs in 2 wks. KIK Follow-up by: Duard Brady LPN,  August 13, 2010 3:21 PM  patient stated that he has been taking 4 mg 5 days out of 7 and 2 mg twice weekly.  He was instructed to take 4 mg every single day and will repeat a PT in two weeks

## 2010-10-25 NOTE — Progress Notes (Signed)
Summary: ?new rx  Phone Note Refill Request Message from:  Patient  pt needs generic coumadin 6 mg call into [facility] [place] [phone] 30 day supply  Initial call taken by: Heron Sabins,  August 10, 2010 4:30 PM    New/Updated Medications: COUMADIN 2 MG TABS (WARFARIN SODIUM) 3 by mouth qd Prescriptions: COUMADIN 2 MG TABS (WARFARIN SODIUM) 3 by mouth qd  #90 x 0   Entered by:   Duard Brady LPN   Authorized by:   [name]  MD   Signed by:   Duard Brady LPN on 29/56/2130   Method used:   Faxed to ...       [facility]. [phone]* (retail)       [address]       [place], [place]  [address]       Ph: [phone] or [phone]       Fax: [phone]   RxID:   [id number]

## 2010-10-25 NOTE — Progress Notes (Signed)
Summary: celebrex ready for pick up  Phone Note Outgoing Call   Call placed by: Duard Brady LPN,  January 02, 2010 3:07 PM Call placed to: Patient Summary of Call: celebrex delivered to office - pt aware ready for pick up - has appt with Dr. Amador Cunas next week - will pick up then KIK Initial call taken by: Duard Brady LPN,  January 02, 2010 3:09 PM

## 2010-10-25 NOTE — Progress Notes (Signed)
  Phone Note Call from Patient       Appended Document:  Pt wants to see Dr. Kirtland Bouchard ASAP with a URI and congestion and SOB. Walks to talk to [employer].  [phone]  Appended Document:  attempt to call- no ans no mach - per Dr. Frederica Kuster - if he can get here this AM --1130a -- will see otherwise see tomorrow - all doctors booked today. KIK

## 2010-10-25 NOTE — Letter (Signed)
Summary: [facility]  [facility]   Imported By: Maryln Gottron 07/16/2010 14:53:20  _____________________________________________________________________  External Attachment:    Type:   Image     Comment:   External Document

## 2010-10-31 NOTE — Assessment & Plan Note (Signed)
Summary: PERSONAL // RS   Vital Signs:  Eric [name] profile:   77 year old male Weight:      208 pounds Temp:     98.0 degrees F oral BP sitting:   120 / 80  (right arm) Cuff size:   regular  Vitals Entered By: Duard Brady LPN (October 22, 2010 3:31 PM) CC: personal - per wife - bleeding from penis Is Eric [name] Diabetic? No   CC:  personal - per wife - bleeding from penis.  History of Present Illness: 77 -year-old Eric [name] who is on chronic anticoagulation.  for the past two weeks.  He has noted some bloody penile drainage.  It occurs nocturnally.  There's been no dysuria or hematuria but he has noticed some slight  left the testicular tenderness and swelling.  He denies any fever, urethral discharge other than the blood.  There has been no trauma.  he has treated hypertension, which has been well controlled  Allergies: 1)  ! Sulfa 2)  ! Codeine  Past History:  Past Medical History: Reviewed history from 05/22/2010 and no changes required. Coronary artery disease GERD Hyperlipidemia Hypertension Atrial fibrillation trigeminal neuralgia history of congestive heart failure Congestive heart failure, history of history of NSAID-induced gastric ulcers, August 2011  Review of Systems  The Eric [name] denies anorexia, fever, weight loss, weight gain, vision loss, decreased hearing, hoarseness, chest pain, syncope, dyspnea on exertion, peripheral edema, prolonged cough, headaches, hemoptysis, abdominal pain, melena, hematochezia, severe indigestion/heartburn, hematuria, incontinence, genital sores, muscle weakness, suspicious skin lesions, transient blindness, difficulty walking, depression, unusual weight change, abnormal bleeding, enlarged lymph nodes, angioedema, breast masses, and testicular masses.    Physical Exam  General:  overweight-appearing.  overweight-appearing.   Abdomen:  Bowel sounds positive,abdomen soft and non-tender without masses, organomegaly or hernias  noted. Rectal:  No external abnormalities noted. Normal sphincter tone. No rectal masses or tenderness. Genitalia:  left testicle is slightly tender and minimally enlarged Prostate:  2+ enlarged.  2+ enlarged.     Impression & Recommendations:  Problem # 1:  EPIDIDYMITIS, LEFT (ICD-604.90) will treat with antibiotic therapy, and clinically observed; Will check a UA next month;  will consider urology referral if there is even microscopic hematuria, or certainly if gross bleeding persists  Problem # 2:  HYPERTENSION (ICD-401.9) controlled  Complete Medication List: 1)  Lipitor 40 Mg Tabs (Atorvastatin calcium) .Marland Kitchen.. 1 once daily 2)  Nexium 40 Mg Cpdr (Esomeprazole magnesium) .Marland Kitchen.. 1 once daily 3)  Ambien 5 Mg Tabs (Zolpidem tartrate) .... One by mouth q hs for insomnia 4)  Colace 100 Mg Caps (Docusate sodium) .... One two times a day. 5)  Carbamazepine 200 Mg Tabs (Carbamazepine) .... One half tablet daily for one month and then discontinue 6)  Durezol 0.05 % Emul (Difluprednate) .... Both eyes 1-2 times qd 7)  Lumigan 0.01 % Soln (Bimatoprost) .... Both eyes qhs 8)  Calcium-vitamin D 600-200 Mg-unit Tabs (Calcium-vitamin d) .... Qd 9)  Proventil Hfa 108 (90 Base) Mcg/act Aers (Albuterol sulfate) .... As directed 10)  Tylenol 325 Mg Tabs (Acetaminophen) .... Prn 11)  Vitamin C 500 Mg Tabs (Ascorbic acid) .... Qd 12)  Tramadol Hcl 50 Mg Tabs (Tramadol hcl) .... One every 6 hours as needed for pain 13)  Coumadin 5 Mg Tabs (Warfarin sodium) .... One and 1/2 tab tues and thurs and one 5 days a week 14)  Allegra Allergy 180 Mg Tabs (Fexofenadine hcl) .... One daily 15)  Benzonatate 100 Mg Caps (Benzonatate) .... One  every 8 hours 16)  Amoxicillin-pot Clavulanate 500-125 Mg Tabs (Amoxicillin-pot clavulanate) .... One twice daily  Eric [name] Instructions: 1)  Limit your Sodium (Salt) to less than 2 grams a day(slightly less than 1/2 a teaspoon) to prevent fluid retention, swelling, or worsening  of symptoms. 2)  Take your antibiotic as prescribed until ALL of it is gone, but stop if you develop a rash or swelling and contact our office as soon as possible. 3)  call if bleeding persists 4)  check a urinalysis at the next office visit Prescriptions: AMOXICILLIN-POT CLAVULANATE 500-125 MG TABS (AMOXICILLIN-POT CLAVULANATE) one twice daily  #14 x 0   Entered and Authorized by:   [name]  MD   Signed by:   [name]  MD on 10/22/2010   Method used:   Electronically to        [facility]. [phone]* (retail)       [address]       [place], [place]  [address]       Ph: [phone] or [phone]       Fax: [phone]   RxID:   [id number]    Orders Added: 1)  Est. Eric [name] Level III [66440]

## 2010-11-02 ENCOUNTER — Ambulatory Visit (INDEPENDENT_AMBULATORY_CARE_PROVIDER_SITE_OTHER): Payer: Medicare PPO | Admitting: Internal Medicine

## 2010-11-02 ENCOUNTER — Encounter: Payer: Self-pay | Admitting: Internal Medicine

## 2010-11-02 VITALS — Temp 98.0°F | Ht 71.0 in | Wt 210.0 lb

## 2010-11-02 DIAGNOSIS — I4891 Unspecified atrial fibrillation: Secondary | ICD-10-CM

## 2010-11-02 DIAGNOSIS — R319 Hematuria, unspecified: Secondary | ICD-10-CM

## 2010-11-02 LAB — POCT URINALYSIS DIPSTICK
Bilirubin, UA: NEGATIVE
Glucose, UA: NEGATIVE
Leukocytes, UA: NEGATIVE
Nitrite, UA: NEGATIVE
pH, UA: 5

## 2010-11-02 LAB — POCT INR: INR: 1.6

## 2010-11-02 NOTE — Progress Notes (Signed)
  Subjective:    Patient ID: Eric [name], male    DOB: 1930/11/21, 77 y.o.   MRN: [id number]  HPI  77 year old patient, on chronic Coumadin anticoagulation.  He noted a second episode of gross hematuria yesterday morning.  He states that his first morning urine specimen was pink, but his urine has been normal throughout.  The rest of the day.  He was seen a week prior after an episode of hematuria and urinalysis was unremarkable without evidence of microscopic hematuria.  A repeat UA also was free of any microscopic hematuria.  Denies any urinary urgency, frequency, or dysuria.  Denies any urethral discharge    Review of Systems  Genitourinary: Positive for hematuria. Negative for dysuria, urgency, frequency, flank pain, decreased urine volume, discharge, penile swelling, scrotal swelling, difficulty urinating, genital sores, penile pain and testicular pain.       Objective:   Physical Exam  Constitutional: He appears well-developed and well-nourished. No distress.  Genitourinary: Penis normal. No penile tenderness.          Assessment & Plan:  Episode of gross hematuria.  Urinalysis normal.  Will check A. INR now.  Options were discussed.  Will set up for urologic evaluation if there is any more gross hematuria, or if there is any microscopic hematuria noted in one month.  He will return at that time for a follow-up INR

## 2010-11-02 NOTE — Patient Instructions (Signed)
Limit your sodium (Salt) intake Call if you notice any further blood in the urine

## 2010-11-15 ENCOUNTER — Encounter: Payer: Self-pay | Admitting: Family Medicine

## 2010-11-15 ENCOUNTER — Telehealth: Payer: Self-pay | Admitting: Internal Medicine

## 2010-11-15 ENCOUNTER — Ambulatory Visit (INDEPENDENT_AMBULATORY_CARE_PROVIDER_SITE_OTHER): Payer: Medicare PPO | Admitting: Family Medicine

## 2010-11-15 VITALS — BP 120/60 | HR 76 | Temp 98.2°F | Resp 14 | Ht 74.0 in | Wt 207.0 lb

## 2010-11-15 DIAGNOSIS — F419 Anxiety disorder, unspecified: Secondary | ICD-10-CM

## 2010-11-15 DIAGNOSIS — F411 Generalized anxiety disorder: Secondary | ICD-10-CM

## 2010-11-15 DIAGNOSIS — I4891 Unspecified atrial fibrillation: Secondary | ICD-10-CM

## 2010-11-15 MED ORDER — CLORAZEPATE DIPOTASSIUM 3.75 MG PO TABS: 3.7500 mg | ORAL_TABLET | Freq: Two times a day (BID) | ORAL | Status: AC | PRN

## 2010-11-15 NOTE — Telephone Encounter (Signed)
Pt into office today - will dicuss at that time

## 2010-11-15 NOTE — Patient Instructions (Signed)
Follow up promptly for any recurrent or progressive chest pain. Be sure to keep follow up with Dr Kirtland Bouchard as scheduled.

## 2010-11-15 NOTE — Telephone Encounter (Signed)
Pt was repeatedly offered an appt with another physician since Dr Kirtland Bouchard was not available but pt advised he wants to speak with Selena Batten, RN about having a Rx sent into pharmacy for  Congestion.... Pt also having anxiety problems..... # [phone].

## 2010-11-15 NOTE — Progress Notes (Signed)
  Subjective:    Patient ID: Eric [name], male    DOB: [birth year], 77 y.o.   MRN: [id number]  HPI  Patient is seen with chief complaint of feeling nervous inside.  Had similar symptoms in the past. No clear precipitating factors. Does not feel depressed. He relates acute illness with diarrhea and chills but no vomiting on Sunday. Diarrhea symptoms have resolved. Overall physically feels better this time. Started having some increased anxiety relative to his illness. Similar symptoms with Proventil but he rarely takes this. He denies any alcohol. Has used low-dose clorazepate in the past for similar symptoms.    patient relates onset last night about 5 minutes after drinking some cold water of transient sharp chest pain 5 the 6 minute duration. Some mild associated shortness of breath. None whatsoever today. Visit history of coronary artery disease. No recent exertional chest symptoms. Did not take any nitroglycerin.  Other chronic problems include history of hyperlipidemia, trigeminal neuralgia, hypertension, chronic atrial fibrillation on Coumadin , and reported history of CHF.   Review of Systems  Constitutional: Positive for fatigue. Negative for fever, chills, activity change and appetite change.  HENT: Negative for sore throat.   Respiratory: Negative for cough, shortness of breath, wheezing and stridor.   Cardiovascular: Positive for chest pain. Negative for palpitations and leg swelling.  Gastrointestinal: Negative for nausea, vomiting, abdominal pain, constipation, blood in stool and abdominal distention.  Skin: Negative for rash.  Neurological: Negative for syncope.  Hematological: Negative for adenopathy.  Psychiatric/Behavioral: Negative for confusion and dysphoric mood.       Objective:   Physical Exam  patient is alert nontoxic in appearance. He is oriented x3 Oropharynx is clear Neck no mass Chest clear to auscultation Heart irregularly irregular rhythm rate  controlled  Abdomen soft and nontender  Extremities no pitting edema       Assessment & Plan:   #1 recent viral gastroenteritis symptomatically improved #2 chronic intermittent anxiety. Refilled low-dose clorazepate 3.75 mg and he will use rarely for severe symptoms   #3 brief atypical chest pain. EKG today shows atrial fibrillation with right bundle branch block which is chronic. No acute changes.  No EKG changes compared with prior tracing. ?esophageal spasm.

## 2010-11-28 ENCOUNTER — Encounter: Payer: Self-pay | Admitting: Internal Medicine

## 2010-11-30 ENCOUNTER — Encounter: Payer: Self-pay | Admitting: Internal Medicine

## 2010-11-30 ENCOUNTER — Ambulatory Visit (INDEPENDENT_AMBULATORY_CARE_PROVIDER_SITE_OTHER): Payer: Medicare PPO | Admitting: Internal Medicine

## 2010-11-30 DIAGNOSIS — R319 Hematuria, unspecified: Secondary | ICD-10-CM

## 2010-11-30 DIAGNOSIS — N453 Epididymo-orchitis: Secondary | ICD-10-CM

## 2010-11-30 DIAGNOSIS — I4891 Unspecified atrial fibrillation: Secondary | ICD-10-CM

## 2010-11-30 DIAGNOSIS — I10 Essential (primary) hypertension: Secondary | ICD-10-CM

## 2010-11-30 LAB — CBC WITH DIFFERENTIAL/PLATELET
Basophils Relative: 0.3 % (ref 0.0–3.0)
Eosinophils Relative: 0.9 % (ref 0.0–5.0)
HCT: 41.3 % (ref 39.0–52.0)
Lymphs Abs: 1.6 10*3/uL (ref 0.7–4.0)
MCV: 94.7 fl (ref 78.0–100.0)
Monocytes Absolute: 0.7 10*3/uL (ref 0.1–1.0)
Monocytes Relative: 8.5 % (ref 3.0–12.0)
Neutrophils Relative %: 70.3 % (ref 43.0–77.0)
RBC: 4.36 Mil/uL (ref 4.22–5.81)
WBC: 8 10*3/uL (ref 4.5–10.5)

## 2010-11-30 NOTE — Progress Notes (Signed)
  Subjective:    Patient ID: Eric [name], male    DOB: 1931/08/09, 77 y.o.   MRN: [id number]  HPI   77 year old patient who is on chronic anticoagulation therapy with Coumadin. He has a history of chronic aches or fibrillation heart failure as well as cerebrovascular disease. Last week he had a second episode of gross hematuria that resolved after one day. He had a similar episode in January and follow daily was negative for occult blood. Approximately 5 weeks ago he he was treated for left-sided epididymitis with 7 days of Augmentin. He has had some very mild intermittent left testicular discomfort but no swelling. At that time he had an episode of gross penile bloody discharge that has not recurred. INRs have been therapeutic. Congestive heart failure has been stable. He also complains of weakness nervousness and intermittent tremor    Review of Systems  Constitutional: Negative for fever, chills, appetite change and fatigue.  HENT: Negative for hearing loss, ear pain, congestion, sore throat, trouble swallowing, neck stiffness, dental problem, voice change and tinnitus.   Eyes: Negative for pain, discharge and visual disturbance.  Respiratory: Negative for cough, chest tightness, wheezing and stridor.   Cardiovascular: Negative for chest pain, palpitations and leg swelling.  Gastrointestinal: Negative for nausea, vomiting, abdominal pain, diarrhea, constipation, blood in stool and abdominal distention.  Genitourinary: Positive for hematuria. Negative for urgency, flank pain, discharge, difficulty urinating and genital sores.  Musculoskeletal: Negative for myalgias, back pain, joint swelling, arthralgias and gait problem.  Skin: Negative for rash.  Neurological: Negative for dizziness, syncope, speech difficulty, weakness, numbness and headaches.  Hematological: Negative for adenopathy. Does not bruise/bleed easily.  Psychiatric/Behavioral: Negative for behavioral problems and dysphoric mood.  The patient is not nervous/anxious.        Objective:   Physical Exam  Constitutional: He is oriented to person, place, and time. He appears well-developed.  HENT:  Head: Normocephalic.  Right Ear: External ear normal.  Left Ear: External ear normal.  Eyes: Conjunctivae and EOM are normal.  Neck: Normal range of motion.  Cardiovascular: Normal rate and normal heart sounds.         Controlled ventricular response  Pulmonary/Chest: He has rales.        Rales at the right base  Abdominal: Bowel sounds are normal.  Genitourinary:        Testicular examination normal  Musculoskeletal: Normal range of motion. He exhibits no edema and no tenderness.  Neurological: He is alert and oriented to person, place, and time.  Psychiatric: He has a normal mood and affect. His behavior is normal.          Assessment & Plan:   recurrent gross hematuria in a patient on chronic anticoagulation therapy- we'll set up for neurological evaluation. We'll check a CBC INR  Nervousness history of tremor. We'll check a TSH to exclude hyperthyroidism  Chronic atrial  Fibrillation  stable  Hypertension stable

## 2010-11-30 NOTE — Patient Instructions (Signed)
Urology consultation as discussed  Return in 3 months for follow-up

## 2010-12-07 LAB — CBC
HCT: 26.2 % — ABNORMAL LOW (ref 39.0–52.0)
HCT: 27.6 % — ABNORMAL LOW (ref 39.0–52.0)
Hemoglobin: 9.1 g/dL — ABNORMAL LOW (ref 13.0–17.0)
Hemoglobin: 9.2 g/dL — ABNORMAL LOW (ref 13.0–17.0)
MCH: 31.6 pg (ref 26.0–34.0)
MCH: 31.6 pg (ref 26.0–34.0)
MCHC: 33 g/dL (ref 30.0–36.0)
MCHC: 33.1 g/dL (ref 30.0–36.0)
MCV: 91 fL (ref 78.0–100.0)
Platelets: 165 10*3/uL (ref 150–400)
Platelets: 167 10*3/uL (ref 150–400)
RBC: 2.88 MIL/uL — ABNORMAL LOW (ref 4.22–5.81)
RBC: 2.91 MIL/uL — ABNORMAL LOW (ref 4.22–5.81)
RDW: 15.2 % (ref 11.5–15.5)
WBC: 9.4 10*3/uL (ref 4.0–10.5)

## 2010-12-07 LAB — BASIC METABOLIC PANEL
CO2: 28 mEq/L (ref 19–32)
Calcium: 8 mg/dL — ABNORMAL LOW (ref 8.4–10.5)
Chloride: 110 mEq/L (ref 96–112)
GFR calc Af Amer: >60 mL/min (ref >60)
GFR calc non Af Amer: >60 mL/min (ref >60)
Glucose, Bld: 108 mg/dL — ABNORMAL HIGH (ref 70–99)
Potassium: 3.7 mEq/L (ref 3.5–5.1)
Potassium: 3.8 mEq/L (ref 3.5–5.1)
Sodium: 142 mEq/L (ref 135–145)
Sodium: 142 mEq/L (ref 135–145)

## 2010-12-07 LAB — COMPREHENSIVE METABOLIC PANEL
Albumin: 3.5 g/dL (ref 3.5–5.2)
Alkaline Phosphatase: 56 U/L (ref 39–117)
BUN: 21 mg/dL (ref 6–23)
Chloride: 103 mEq/L (ref 96–112)
Creatinine, Ser: 0.92 mg/dL (ref 0.4–1.5)
GFR calc non Af Amer: >60 mL/min (ref >60)
Glucose, Bld: 119 mg/dL — ABNORMAL HIGH (ref 70–99)
Potassium: 4.1 mEq/L (ref 3.5–5.1)
Total Bilirubin: 0.6 mg/dL (ref 0.3–1.2)

## 2010-12-07 LAB — URINALYSIS, ROUTINE W REFLEX MICROSCOPIC
Bilirubin Urine: NEGATIVE
Glucose, UA: NEGATIVE mg/dL
Hgb urine dipstick: NEGATIVE
Ketones, ur: NEGATIVE mg/dL
Nitrite: NEGATIVE
Specific Gravity, Urine: 1.013 (ref 1.005–1.030)
pH: 7.5 (ref 5.0–8.0)

## 2010-12-07 LAB — CARDIAC PANEL(CRET KIN+CKTOT+MB+TROPI)
CK, MB: 4.2 ng/mL — ABNORMAL HIGH (ref 0.3–4.0)
Relative Index: 2.5 (ref 0.0–2.5)
Total CK: 176 U/L (ref 7–232)
Total CK: 193 U/L (ref 7–232)
Total CK: 229 U/L (ref 7–232)
Troponin I: 0.03 ng/mL (ref 0.00–0.06)
Troponin I: 0.03 ng/mL (ref 0.00–0.06)
Troponin I: 0.03 ng/mL (ref 0.00–0.06)

## 2010-12-07 LAB — FOLATE: Folate: >20 ng/mL

## 2010-12-07 LAB — DIFFERENTIAL
Basophils Absolute: 0 10*3/uL (ref 0.0–0.1)
Eosinophils Absolute: 0.1 10*3/uL (ref 0.0–0.7)
Lymphocytes Relative: 16 % (ref 12–46)
Monocytes Relative: 9 % (ref 3–12)
Neutrophils Relative %: 74 % (ref 43–77)

## 2010-12-07 LAB — IRON AND TIBC
Saturation Ratios: 11 % — ABNORMAL LOW (ref 20–55)
TIBC: 266 ug/dL (ref 215–435)
UIBC: 236 ug/dL

## 2010-12-07 LAB — POCT CARDIAC MARKERS
CKMB, poc: 2.7 ng/mL (ref 1.0–8.0)
Myoglobin, poc: 262 ng/mL (ref 12–200)
Troponin i, poc: <0.05 ng/mL (ref 0.00–0.09)

## 2010-12-07 LAB — BRAIN NATRIURETIC PEPTIDE: Pro B Natriuretic peptide (BNP): 161 pg/mL — ABNORMAL HIGH (ref 0.0–100.0)

## 2010-12-07 LAB — PROTIME-INR
INR: 1.69 — ABNORMAL HIGH (ref 0.00–1.49)
Prothrombin Time: 18.7 seconds — ABNORMAL HIGH (ref 11.6–15.2)
Prothrombin Time: 20.1 seconds — ABNORMAL HIGH (ref 11.6–15.2)

## 2010-12-07 LAB — CULTURE, BLOOD (ROUTINE X 2): Culture: NO GROWTH

## 2010-12-07 LAB — LIPID PANEL
HDL: 31 mg/dL — ABNORMAL LOW (ref >39)
Total CHOL/HDL Ratio: 3.5 RATIO

## 2010-12-07 LAB — CK TOTAL AND CKMB (NOT AT ARMC): Total CK: 206 U/L (ref 7–232)

## 2010-12-07 LAB — CROSSMATCH
ABO/RH(D): O POS
Antibody Screen: NEGATIVE

## 2010-12-07 LAB — CORTISOL: Cortisol, Plasma: 13.9 ug/dL

## 2010-12-07 LAB — VITAMIN B12: Vitamin B-12: 386 pg/mL (ref 211–911)

## 2010-12-19 ENCOUNTER — Telehealth: Payer: Self-pay

## 2010-12-19 NOTE — Telephone Encounter (Signed)
Order put in. KIK

## 2010-12-19 NOTE — Telephone Encounter (Signed)
Dr. Gerome Sam is the only local Urologist that accepts this [employer], that I know of.  Send me a new referral for this and I will call the pt and explain that she would need to contact her insurance co to find out who would accept her insurance.

## 2010-12-19 NOTE — Telephone Encounter (Signed)
VM from pt - the docotr we referred him to in [place] he will not see - there is a bad family hx with that docotr. Requesting different doctor. (i think it was a urology appt) Please assist.

## 2011-01-03 LAB — PROTIME-INR

## 2011-01-04 ENCOUNTER — Other Ambulatory Visit: Payer: Self-pay

## 2011-01-04 MED ORDER — CARBAMAZEPINE 200 MG PO TABS: ORAL_TABLET | ORAL | Status: DC

## 2011-01-04 NOTE — Telephone Encounter (Signed)
Faxed back to [facility].

## 2011-01-08 ENCOUNTER — Encounter: Payer: Self-pay | Admitting: Internal Medicine

## 2011-01-10 ENCOUNTER — Telehealth: Payer: Self-pay

## 2011-01-10 NOTE — Telephone Encounter (Signed)
Spoke with pt - informed lipitor and celebex from patient assist program has arrived .

## 2011-01-15 ENCOUNTER — Ambulatory Visit (INDEPENDENT_AMBULATORY_CARE_PROVIDER_SITE_OTHER): Payer: Medicare PPO | Admitting: Internal Medicine

## 2011-01-15 ENCOUNTER — Encounter: Payer: Self-pay | Admitting: Internal Medicine

## 2011-01-15 DIAGNOSIS — E785 Hyperlipidemia, unspecified: Secondary | ICD-10-CM

## 2011-01-15 DIAGNOSIS — G500 Trigeminal neuralgia: Secondary | ICD-10-CM

## 2011-01-15 DIAGNOSIS — I509 Heart failure, unspecified: Secondary | ICD-10-CM

## 2011-01-15 DIAGNOSIS — I10 Essential (primary) hypertension: Secondary | ICD-10-CM

## 2011-01-15 DIAGNOSIS — I4891 Unspecified atrial fibrillation: Secondary | ICD-10-CM

## 2011-01-15 MED ORDER — WARFARIN SODIUM 5 MG PO TABS: ORAL_TABLET | ORAL | Status: DC

## 2011-01-15 MED ORDER — WARFARIN SODIUM 7.5 MG PO TABS: ORAL_TABLET | ORAL | Status: DC

## 2011-01-15 MED ORDER — CARBAMAZEPINE 200 MG PO TABS: ORAL_TABLET | ORAL | Status: DC

## 2011-01-15 MED ORDER — WARFARIN SODIUM 7.5 MG PO TABS: 7.5000 mg | ORAL_TABLET | Freq: Every day | ORAL | Status: DC

## 2011-01-15 NOTE — Progress Notes (Signed)
  Subjective:    Patient ID: Eric [name], male    DOB: Sep 25, 1930, 77 y.o.   MRN: [id number]  HPI  77 year old patient who is seen today for followup of his chronic atrial fibrillation and heart failure. He is on chronic Coumadin anticoagulation therapy. Is requesting a new prescription for it Coumadin 7.5 mg. He takes this 4 times per week and 5 mg 3 times per week. He is having a difficult time cutting the tablets in half. He has a history of trigeminal neuralgia which has done quite well on Tegretol. He was challenged off the medication but then started having right hemifacial discomfort again. He is now resume Tegretol 100 mg twice daily. He is followed at the [facility] and they recently performed a comprehensive panel this was reviewed with the patient and was unremarkable. His LDL cholesterol was slightly above 100 but he had not been taken his Lipitor at that time    Review of Systems  Constitutional: Negative for fever, chills, appetite change and fatigue.  HENT: Negative for hearing loss, ear pain, congestion, sore throat, trouble swallowing, neck stiffness, dental problem, voice change and tinnitus.   Eyes: Negative for pain, discharge and visual disturbance.  Respiratory: Negative for cough, chest tightness, wheezing and stridor.   Cardiovascular: Negative for chest pain, palpitations and leg swelling.  Gastrointestinal: Negative for nausea, vomiting, abdominal pain, diarrhea, constipation, blood in stool and abdominal distention.  Genitourinary: Negative for urgency, hematuria, flank pain, discharge, difficulty urinating and genital sores.  Musculoskeletal: Negative for myalgias, back pain, joint swelling, arthralgias and gait problem.  Skin: Negative for rash.  Neurological: Negative for dizziness, syncope, speech difficulty, weakness, numbness and headaches.  Hematological: Negative for adenopathy. Does not bruise/bleed easily.  Psychiatric/Behavioral: Negative for behavioral  problems and dysphoric mood. The patient is not nervous/anxious.        Objective:   Physical Exam  Constitutional: He is oriented to person, place, and time. He appears well-developed.  HENT:  Head: Normocephalic.  Right Ear: External ear normal.  Left Ear: External ear normal.  Eyes: Conjunctivae and EOM are normal.  Neck: Normal range of motion.  Cardiovascular: Normal rate and normal heart sounds.   Pulmonary/Chest: Breath sounds normal.  Abdominal: Bowel sounds are normal.  Musculoskeletal: Normal range of motion. He exhibits no edema and no tenderness.  Neurological: He is alert and oriented to person, place, and time.  Psychiatric: He has a normal mood and affect. His behavior is normal.          Assessment & Plan:   Chronic atrial fibrillation stable Congestive heart failure stable Hypertension well controlled Dyslipidemia.  We'll continue present regimen because this is dispensed low-salt diet weight loss all encouraged

## 2011-01-15 NOTE — Patient Instructions (Signed)
Limit your sodium (Salt) intake    It is important that you exercise regularly, at least 20 minutes 3 to 4 times per week.  If you develop chest pain or shortness of breath seek  medical attention.  Return in 3 months for follow-up  

## 2011-02-05 NOTE — Letter (Signed)
Feb 13, 2007    Mila Homer. Sherlean Foot, M.D.  [facility]  Div. of [facility] [employer] .  [address] [place]  [place] [address]   RE:  [name], [name]  MRN:  [id number]  /  DOB:  [birth year]   Dear Dr. Sherlean Foot,   Thank you for your evaluation of [name] who is scheduled for a right  total hip replacement therapy in the near future.  He was seen in our  office on Feb 13, 2007 and clinically was quite stable.  He has a  history of chronic atrial fibrillation, hypertension, both of which were  well controlled.  He is scheduled to see his cardiologist prior to the  anticipated surgery.  His atrial fibrillation, he is on chronic Coumadin  which may be discontinued several days preoperatively without bridging  therapy with Lovenox.  There are no contraindications to the anticipated  surgery.  If medical problems arise perioperatively, please do not  hesitate to call this office.    Sincerely,      [name], MD  Electronically Signed    PFK/MedQ  DD: 02/17/2007  DT: 02/17/2007  Job #: [id number]

## 2011-02-05 NOTE — Discharge Summary (Signed)
Eric [name], Eric [name] NO.:  [id number]   MEDICAL RECORD NO.:  [id number]          PATIENT TYPE:  INP   LOCATION:  5013                         FACILITY:  [facility]   PHYSICIAN:  Mila Homer. Sherlean Foot, M.D. DATE OF BIRTH:  01-May-1931   DATE OF ADMISSION:  03/14/2008  DATE OF DISCHARGE:  03/17/2008                               DISCHARGE SUMMARY   FINAL DIAGNOSIS:  For this admission, end-stage degenerative joint  disease of the left knee.   PROCEDURE:  While in hospital, left total knee arthroplasty.   DISCHARGE SUMMARY:  The patient is a 77 year old male with many year  history of increasing pain, catching, and popping of his left knee.  X-  rays are positive for DJD showing bone-on-bone changes.  He has failed  conservative treatment, and he now desires a left total knee  arthroplasty after discussing risks versus benefits of the procedure in  hopes of decreasing his pain and increasing his stability in the knee.   MEDICINE ALLERGIES:  LOVENOX.  Sensitivities to CODEINE, PERCOCET, and  DARVOCET.   MEDICAL HISTORY:  Significant for,  1. Arthritis.  2. Previous right total hip arthroplasty.  3. Hypertension.  4. History of stroke.  5. Chronic atrial fibrillation.  6. Postoperative ileus.  7. GERD.  8. Rosacea.   PREVIOUS SURGICAL HISTORY:  Shows a left knee arthroplasty in 2005, left  shoulder arthroplasty in 2007, left knee arthroplasty in 2001, right  total hip arthroplasty in 2008, cataract surgery, and he did develop a  postop ileus and __________ after his total hip.   MEDICATIONS:  At time of admission, Optive drops left eye, nitroglycerin  as needed, Lumigan, __________, Lipitor, Celebrex, Nexium, vitamin C,  atenolol, minocycline, furosemide, and digoxin.   SOCIAL HISTORY:  The patient smoked 2 packs per day tobacco but has quit  for several years.  He does not use alcohol.  He is married.   FAMILY HISTORY:  Positive for mother who died at age 23 with  history of  heart disease, heart attack, hypertension, and CVA.  Father died at age  43 with history of heart disease and heart attack as well as  hypertension and CVA.  The patient had a history of cataracts; has upper  dentures; does have notable palpitations at times; has a past history of  either rheumatic or scarlet fevers, unsure as to which; and occasional  headache.   PHYSICAL EXAMINATION:  VITAL SIGNS:  The patient's temperature is 98.8,  pulse 60, respirations 18, and blood pressure 110/70.  He is 6 feet 2  inches and 225-pound male.  HEAD:  Atraumatic and normocephalic.  EARS:  TMs clear.  EYES:  Pupils are equal and round to light and accommodation.  No  evidence of current cataract.  NOSE:  Patent.  THROAT:  Benign.  NECK:  Full rotation with decreased extension and flexion.  CHEST:  Lungs are clear.  CARDIAC:  Shows irregular rhythm.  No murmurs noted.  ABDOMEN:  Within normal limits.  No masses.  Bowel sounds 2+.  NEURO:  He is neurovascularly intact.  SKIN:  Intact  throughout.  EXTREMITIES:  Range of motion of left knee 5-110 degrees.  Positive  crepitus.  Joint line pain and 1+ effusion.   PREOPERATIVE LABS:  Including CBC, CMET, chest x-ray, EKG, PT, and PTT  are all within normal limits with exception of an EKG significant for  atrial fibrillation and an incomplete right bundle branch block.  No  significant change from previous tracings.  Glucose of 114.  All other  labs within normal limits.   HOSPITAL COURSE:  On the day of admission, the patient was taken to the  operating room at [facility] where he underwent a left total knee  arthroplasty utilizing Zimmer components with a size F left femur and a  size 6 fluted stem tibia, a 35 x 9-mm poly patella, and a 12-mm  articular surface bearing all components cemented.  The patient was  placed on perioperative antibiotic solution and postoperative Coumadin  prophylaxis as he had stopped this preoperatively  per agreement with his  cardiologist.  He was begun with mechanical means of blood clot  prevention as well.  Physical therapy was begun in the recovery room  using a CPM.  Foley catheter was placed preoperatively.  He was placed  on a PCA postop for pain control.  He was seen postoperatively by  [facility] because of continued atrial fibrillation in the PACU  setting.  They were concerned about his risk of CVA and recommended  using Arixtra until Coumadin became therapeutic once again.  Postoperative day 1, the patient was without significant complaints.  He  remained in chronic AFib but had no other cardiology complaints.  He was  afebrile.  Vital signs were stable.  Hemoglobin 11.9.  Pain was well  controlled with oral pain medication as well as PCA.  Calf was soft and  nontender.  Heart and lungs were clear.  Incentive spirometry was  encouraged, and physical therapy was begun in earnest.  Postoperative  day 2, the patient was in only minimal pain.  Hemoglobin 11.5, WBC 14.2.  Marcaine pump and Hemovac were discontinued as well as this PCA.  Otherwise, the wound was clean and dry during dressing change, and he  was making good progress with increased range of motion and increased  ability to ambulate.  He was thought to need an extra day in the  hospital from Cardiology standpoint as they wanted to continue to  monitor him until he was therapeutic on his Coumadin.  Postoperative day  3, the patient was without complaint.  He was afebrile.  Vital signs  were stable.  Hemoglobin 10.8, WBC 11.9, and INR 1.9.  Dressing was dry.  Wound was benign.  He was cleared by Cardiology from a medical  standpoint and was discharged home to the care of his family.  He will  continue to have physical therapy at home, and he was given  prescriptions for Darvocet, Arixtra, and Robaxin.  He will continue with  medications at home as per his med rec sheet.  He will return to clinic  approximately  2 weeks' time to see Dr. Sherlean Foot for followup or sooner  should he have any increased temperature, drainage from wound, or pain  not well controlled.   ACTIVITY:  Weightbearing as tolerated using walker and total knee  precautions.  Home health physical therapy, DME's and __________ were  provided from [place].   DIET:  Regular.   FINAL DIAGNOSIS:  End-stage degenerative joint disease of the left knee.   PROCEDURE:  Left total knee arthroplasty.   CONSULTS:  [facility] for chronic AFib.      Laural Benes. Jannet Mantis.    ______________________________  Mila Homer Sherlean Foot, M.D.    Merita Norton  D:  04/19/2008  T:  04/20/2008  Job:  [id number]

## 2011-02-05 NOTE — Discharge Summary (Signed)
Eric [name], Eric [name] NO.:  [id number]   MEDICAL RECORD NO.:  [id number]          PATIENT TYPE:  INP   LOCATION:  5025                         FACILITY:  [facility]   PHYSICIAN:  Mila Homer. Sherlean Foot, M.D. DATE OF BIRTH:  June 11, 1931   DATE OF ADMISSION:  05/11/2007  DATE OF DISCHARGE:  05/16/2007                               DISCHARGE SUMMARY   ADMITTING DIAGNOSES:  1. End-stage osteoarthritis right hip.  2. Chronic atrial fib on Coumadin.  3. Hypertension.  4. Gastroesophageal reflux disease.  5. Rosacea.  6. History of cerebrovascular accident in 61.  7. History of glaucoma.   DISCHARGE DIAGNOSES:  1. Status post right total hip arthroplasty.  2. Postop ileus.  3. Hypokalemia.  4. Chronic Coumadin secondary to AFib  5. Gastroesophageal reflux disease.  6. History of cerebrovascular accident 1990.  7. Rosacea.  8. History of glaucoma.   HISTORY OF PRESENT ILLNESS:  Eric [name] is a 77 year old male who  presents with a five-to-ten-year history of gradual onset, progressive,  right hip pain with sudden exacerbation x1 year.  No injury, no surgical  procedure on the right hip.  Pain constant, sharp sensation, throbbing  over the right trochanteric groin area radiating into the leg.  Pain  increases with walking, decreases with rest.  Mechanical symptoms  positive for giving way.  Has definitely been using definite type shoes  and cane for the past three years.  Cortisone injections x5 years, but  the last injection did not help.  Occasional back pain.  Occasional  right leg numbness.   SURGICAL PROCEDURE:  Patient was taken to the operating room on May 11, 2007 by Dr. Georgena Spurling assisted by Eric Drone. Petrarca, PA-C.  Patient was placed under general anesthesia then underwent a right total  hip arthroplasty.  The following components were used:  Size 11 fully  coded femoral stem, a 60 mm outside diameter acetabular component with  no holes.  Standard  crosslinked 32 mm diameter femoral head.  Patient  tolerated procedure well and returned to recovery in good stable  condition.   HOSPITAL CONSULTS:  The following consults were obtained while the  patient was hospitalized:  1. [facility].  2. PT/OT and case management.   HOSPITAL COURSE:  Postop day one, the patient afebrile, vital signs  stable.  H&H 11.8, 34.9, PT was 17.4, INR was 1.4.  Postop day two,  patient with right upper quadrant and right lower quadrant pain that  started the a.m. of May 13, 2007.  Emesis yesterday x2, none since  then.  Vital signs stable.  Patient afebrile.  H&H 7.6, 34.1, white  count was 13,000, PT was 19.3, INR 1.6, potassium was low at 3.2.  Stat  KUB was ordered and showed dilated colon, probable ileus.  __________  was held.  Patient's hypokalemia was treated.   Postop day three, patient afebrile, vital signs stable.  H&H stable, INR  was 2.0, protime was 26.2.  Patient complained of heartburn on Protonix.  Hypokalemia was resolved and the potassium was 3.6.  On physical exam,  increased bowel  sounds.  Patient was mildly distended and mildly tender.  Ileus seemed to be improving.  Patient was placed on trial, discontinued  Reglan.  MiraLax added.   Postop day four, patient beganphysical therapy.  No chest pain,  shortness of breath, nausea, vomiting, positive bowel movement.  Patient  afebrile, vital signs stable.  H&H stable at 10.4 and 30.6.  White count  was 9700.  Potassium was 3.6, PT was 26.3, INR 2.3.   Postop day five, patient afebrile, vital signs stable.  Progressing well  with physical therapy.  Ambulated 20 feet with supervision only.  Patient was later discharged to home on that day in good stable  condition.  INR was 2.2.   LABS:  Routine labs on admission:  CBC:  White count was 8000,  hemoglobin was 13.9 and hematocrit 40.7, platelets 161.   Coags on admission:  PT 22.3, INR 1.9.   Routine chemistries:  Sodium 141,  potassium 3.8, chloride 104, bicarb  30, glucose 97, BUN 13, creatinine 0.79.   Hepatic enzymes on admission:  All values within normal limits.   Urinalysis on admission:  Ketones greater than 80 mg/dl, otherwise  negative.  Urine culture was negative for growth.   X-rays postop:  One-view abdomen showed probable ileus on May 13, 2007.  Right hip dated May 11, 2007:  Postop two-view showed right  total hip replacement, normal postoperative appearance, no complicated  features.   DISCHARGE INSTRUCTIONS:  Meds:  1. Minocycline 100 mg one daily continue.  2. Calcium 600 plus D one daily continue.  3. Vitamin C 1000 mg one daily continue.  4. Furosemide 40 mg daily continue.  5. Atenolol 25 mg daily continue.  6. Coumadin continue per [facility] pharmacy protocol.  7. Nexium 40 mg one daily continue.  8. Celebrex 200 mg one daily continue.  9. Lipitor 40 mg one daily continue.  10.Nitroglycerin 0.4 mg p.r.n. continue.  11.Lumigan ophthalmic solution one drop each eye q.a.m.  12.Darvocet-N 100 one tab q.6 hours p.r.n.  13.Milk of magnesia or MiraLax as needed for constipation over-the-      counter.   WOUND CARE:  Patient should keep wound clean, dry.  Change dressing  daily.  Call office with any signs of infection.  May shower in two days  if no drainage.   WEIGHTBEARING STATUS:  Patient is weightbearing as tolerated with a  walker.   FOLLOWUP:  Patient needs followup with Dr. Sherlean Foot in 14 days from  surgery.  Patient will call office at [phone] for appointment.   DIET:  Regular diet.   CONDITION ON DISCHARGE:  Patient was discharged to home in good stable  condition.      [name], P.A.    ______________________________  Mila Homer. Sherlean Foot, M.D.    GC/MEDQ  D:  07/10/2007  T:  07/11/2007  Job:  [id number]

## 2011-02-05 NOTE — Consult Note (Signed)
NAMEPEYSON, POSTEMA NO.:  [id number]   MEDICAL RECORD NO.:  [id number]          PATIENT TYPE:  INP   LOCATION:  5013                         FACILITY:  [facility]   PHYSICIAN:  Pricilla Riffle, MD, FACCDATE OF BIRTH:  April 17, 1931   DATE OF CONSULTATION:  03/14/2008  DATE OF DISCHARGE:  03/17/2008                                 CONSULTATION   IDENTIFICATION:  Mr. Bloodgood who is a 77 year old who we are asked  regarding atrial fibrillation.   HISTORY OF PRESENT ILLNESS:  The patient is followed by Dr. Dulce Sellar at  [facility].  He is a 77 year old gentleman, no known history of  CAD.  He was admitted today for total knee replacement.  We were asked  to see regarding his atrial fibrillation and medical management.   The patient denies chest pain.  He is now postop, seem to be tolerating  surgery well.  He rarely notices palpitations.  No history of presyncope  or syncope.  Activity is limited by orthopedic issues.  Denies dyspnea  and no chest pressure.   ALLERGIES:  CODEINE, PERCOCET, SULFA, and LOVENOX.   MEDICATIONS:  1. Eyedrops.  2. Lipitor 40.  3. Celebrex 200.  4. Nexium 400.  5. Calcium.  6. Atenolol 25.  7. Minocycline 10.  8. Lasix 40.  9. Digoxin 0.125.   PAST MEDICAL HISTORY:  1. Atrial fibrillation since 1992.  2. History of a CVA in 1990 when atrial fibrillation was discovered.  3. Hypertension.  4. Dyslipidemia.  5. Remote tobacco.  6. DJD.  7. COPD.  8. GE reflux.  9. Rosacea.  10.Acute glaucoma.   Note, the patient had a stress test, 2009; a Myoview, February 26, 2008  question inferior scar, no ischemia, and LVEF of 86%.   SOCIAL HISTORY:  The patient lives in [place].  He is married, he has a  60-pack-year history of smoking, quit in 2007.  Does not drink.   FAMILY HISTORY:  Mother died of an MI and CVA at age 12.  Father died of  an MI at age 63.  Three sisters, none with CAD.   REVIEW OF SYSTEMS:  All systems reviewed,  negative to the above problem  except as noted above.   PHYSICAL EXAMINATION:  GENERAL:  The patient is in no acute distress.  Denies chest pressure.  No palpitations.  No dizziness.  VITAL SIGNS:  Temperature is 96.9, blood pressure 133/74, pulse is 64,  respiratory rate 16, and O2 sat on 2 L 99%.  HEENT:  Normocephalic and atraumatic.  EOMI.  PERRL.  Mucous membranes  are moist.  NECK:  JVP is normal.  No bruits.  No thyromegaly.  LUNGS:  Relatively clear to auscultation.  No rales or wheezes.  CARDIAC:  Irregularly irregular S1and S2.  No S3.  No significant  murmurs.  ABDOMEN:  Supple, decreased bowel sounds, and nontender.  No masses.  EXTREMITIES:  Left knee is dressed.  No lower extremity on the right  leg.  NEURO: Alert and oriented x 2.  Cranial nerves II through XII intact.  Moving all extremities,  except the left leg which of course is dressed.   Chest x-ray not done.  A 12-lead EKG shows atrial fibrillation at 56  beats per minute, this was done on March 02, 2008.   LABORATORY DATA:  Hemoglobin 14.4, WBC of 10.5, BUN and creatinine of 23  and 0.9, and potassium was 3.9.  INR of 1.1.   IMPRESSION:  1. The patient is a 77 year old gentleman with chronic atrial      fibrillation, also has a history of remote stroke when his atrial      fibrillation was found.  He has not been on Coumadin now for about      a week.  I am very concerned with his history of cerebrovascular      accident to come off the Coumadin.  I think he should be on some      sort of coverage now.  He is beginning Coumadin tonight, but I      think cross coverage is important and I will talk this over with      orthopedics.  I would recommend Arixtra 2.5 mg subcu daily while      Coumadin is being loaded.  2. His rates appear to be controlled, would continue on current      regimen.  Check EKG in the morning.  Continue telemetry.  3. Hypertension, adequate control followup.  4. Dyslipidemia on  statin.      Pricilla Riffle, MD, [facility]  Electronically Signed     PVR/MEDQ  D:  03/18/2008  T:  03/19/2008  Job:  [id number]
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
## 2011-02-05 NOTE — Op Note (Signed)
NAMEWENDELL, NICOSON NO.:  [id number]   MEDICAL RECORD NO.:  [id number]          PATIENT TYPE:  INP   LOCATION:  5013                         FACILITY:  [facility]   PHYSICIAN:  Mila Homer. Sherlean Foot, M.D. DATE OF BIRTH:  [birth year]   DATE OF PROCEDURE:  03/14/2008  DATE OF DISCHARGE:                               OPERATIVE REPORT   SURGEON:  Mila Homer. Sherlean Foot, MD.   ASSISTANTSkip Mayer.   PREOPERATIVE DIAGNOSIS:  Left knee osteoarthritis.   POSTOPERATIVE DIAGNOSIS:  Left knee osteoarthritis.   PROCEDURE:  Left total knee arthroplasty.   INDICATIONS FOR PROCEDURE:  The patient is a 77 year old white male with  failure to conservative measures for osteoarthritis of the left knee.  Informed consent obtained.   DESCRIPTION OF PROCEDURE:  The patient was laid supine, administration  of general anesthesia, Foley catheter was placed.  Left leg was prepped  and draped in the usual sterile fashion.  The extremity was  exsanguinated with Esmarch and tourniquet inflated to 350 mmHg and set  for an hour.  Midline incision was made with a #10 blade approximately 6-  7 inches in length.  I then used a fresh blade to make a median  parapatellar arthrotomy and performed synovectomy.  Interestingly, the  cortical bone in the junction between the articular surface and the  cancellus bone had a deposition of some blue material, like venous  congestion or some reaction to metal.  The patella was planed down to 15  mm from a thickness of 25 mm and 3 lug holes were drilled through a 35-  mm template and with prosthetic trial in place, we recreated 25-mm  thickness.  I then used the extramedullary alignment system on the tibia  to make a perpendicular cut to the anatomic axis.  We used the  intramedullary alignment system on the femur with 4 degrees of valgus  angle and made that with a sagittal saw.  I then marked out and cut  subcondylar axis and the posterior condylar angle  measured 5 degrees.  I  pinned the cutting block to 5 degrees of external rotation and measured  for the size F.  I then placed a lamina spreader in the knee, cleaned  out the ACL, PCL, medial lateral menisci, and posterior condylar  osteophytes.  I then finished the size F cutting block of the femur  drilling for the large  cutting for the box, a size 6 tibial tray  drilling keel.  I then trialed with a size F femur, size 6, tibia size  12 insert at 35 patella with a good flexion/extension gap balance.  Easy  flexion back to 130-135 degrees of flexion.  I then removed the trial  components and copiously irrigated.  I found 2 cysts on the lateral  tibial plateau, just debrided out, and placed in the cement and when I  cemented in the tibial component, I cemented all three components,  removed excess cement allowing the cement to harden in extension.  Left  the tourniquet down at 49 minutes and obtained hemostasis and copiously  irrigated  again.  Then, left a Hemovac coming out superolaterally and  deep to the arthrotomy.  Pain catheter coming out supermedial and  superficial to the arthrotomy.  I then closed the arthrotomy with figure-  of-8 3-0 Vicryl sutures,  deep soft tissues buried with 0 Vicryl  sutures, subcuticular 2-0 Vicryl sutures, and skin with staples.  Dressed with Xeroform, dry sponges, Webril, and TED stocking.   COMPLICATIONS:  None.   DRAINS:  One Hemovac and one PEG catheter.   ESTIMATED BLOOD LOSS:  300 mL.   TOURNIQUET TIME:  49 minutes.           ______________________________  Mila Homer Sherlean Foot, M.D.     SDL/MEDQ  D:  03/14/2008  T:  03/15/2008  Job:  [id number]

## 2011-02-05 NOTE — Op Note (Signed)
NAMEREVIN, CORKER NO.:  [id number]   MEDICAL RECORD NO.:  [id number]          PATIENT TYPE:  INP   LOCATION:  2860                         FACILITY:  [facility]   PHYSICIAN:  Mila Homer. Sherlean Foot, M.D. DATE OF BIRTH:  Feb 05, 1931   DATE OF PROCEDURE:  05/11/2007  DATE OF DISCHARGE:                               OPERATIVE REPORT   SURGEON:  Mila Homer. Sherlean Foot, M.D.   ASSISTANTArlys John D. Petrarca, P.A.-C.   ANESTHESIA:  General.   PREOPERATIVE DIAGNOSIS:  Right hip osteoarthritis.   POSTOPERATIVE DIAGNOSIS:  Right hip osteoarthritis.   PROCEDURE:  Right total hip arthroplasty.   INDICATIONS FOR PROCEDURE:  The patient is a 77 year old white male with  failure of conservative measures for osteoarthritis of the hip.  Informed consent was obtained.   DESCRIPTION OF PROCEDURE:  The patient was laid supine and administered  general anesthesia.  Foley catheter placement and then placed in left  lateral decubitus position.  Right hip was then prepped and draped in  usual sterile fashion.  #10 blade was used to make 6 inch incision  centered over the trochanter.  Then used cautery obtained hemostasis and  dissected down to and through the fascia lata.  I held the fascia lata  retracted with a Charnley retractor.  I then incised the antral one half  the gluteus medius, all the minimus and anterior one half of the  lateralis elevating them all in single sleeve of tissue and tying with  three stay sutures.  I then performed anterior hip capsulectomy with  holmium protecting the abductor medius minimus lateralis sleeve.  I then  used the neck cutting guide to mark out a femoral neck cut and made the  cut with the reciprocating saw.  Removed head and neck segment without  having to dislocate hip.  I then placed a holmium retractor anteriorly  and posteriorly to the acetabulum and switched sides of table with my  PA.  From the front side of the table performed a circumferential  labral  debridement and then sequentially reamed up to 58 mm.  I then placed in  a 6 hole no spike fiber mesh cup.  This afforded excellent stability.  I  then went to the back side of the table.  I externally rotated and  flexed the leg into a sterile fashion off the front side of the table.  I used a canal finder to find the canal and sequentially reamed up to 11  mm.  Was obvious on the x-ray the patient had very fluted femoral canal.  Therefore, necessitating a small size.  I then broached to size 11, had  good rotational stability.  I then trialed various sizes of necks and  heads.  I chose a +7 which afforded excellent stability.  I tamped in  the real polyethylene liner already.  I then placed in a fully porous  coated size 11 stem, tamped on a 7-mm x 32-mm head onto a clean Morse  taper and located the hip.  I took it through an aggressive range of  motion and it was  very stable.  I then irrigated.  I then closed the  medius minimus lateralis sleeve through drill holes in the femur and  oversewed with figure-of-eight #1 Ethibond suture.  I then closed the  fascia lata with a running #1 Vicryls and deep soft tissue with buried 0  Vicryls and subcuticular layer with running 2-0 Vicryls. I then placed  skin staples and flexed hip dressing.   COMPLICATIONS:  None.   DRAINS:  None   ESTIMATED BLOOD LOSS:  300 mL.           ______________________________  Mila Homer. Sherlean Foot, M.D.     SDL/MEDQ  D:  05/11/2007  T:  05/11/2007  Job:  [id number]

## 2011-02-08 NOTE — Op Note (Signed)
NAMEJOHNAVON, MCCLAFFERTY              ACCOUNT NO.:  [id number]   MEDICAL RECORD NO.:  [id number]          PATIENT TYPE:  AMB   LOCATION:  DSC                          FACILITY:  [facility]   PHYSICIAN:  Mila Homer. Sherlean Foot, M.D. DATE OF BIRTH:  October 11, 1930   DATE OF PROCEDURE:  08/22/2004  DATE OF DISCHARGE:                                 OPERATIVE REPORT   PREOPERATIVE DIAGNOSIS:  Left knee arthritis, medial and lateral meniscus  tear.   POSTOPERATIVE DIAGNOSIS:  Left knee arthritis, medial and lateral meniscus  tear.   PROCEDURE:  Left knee arthroscopy with partial medial meniscectomy and  partial lateral meniscectomy, chondroplasty of the lateral and  patellofemoral compartments.   SURGEON:  Mila Homer. Sherlean Foot, M.D.   ASSISTANT:  None.   INDICATIONS:  The patient is a 77 year old woman with some severe lateral  compartment DJD and also with mechanical symptoms.  MRI evidence showed  significant degeneration of the lateral compartment but also a medial  meniscus tear.  Informed consent was obtained.   DESCRIPTION OF PROCEDURE:  The patient was laid supine and administered  general anesthesia.  The left lower extremity was prepped and draped in the  usual sterile fashion.  Inferolateral and inferomedial portals were created  with a #11 blade, blunt trocar and cannula.  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  I used a Biochemist, clinical and performed a  chondroplasty in the patellofemoral compartment.  I then went down to the  medial compartment.  Actually, there was very minimal chondromalacia here,  some areas of grade 1 and 2 on the medial femoral condyle.  There was a  radial tear of the posterior horn of the medial meniscus.  Straight basket  forceps and a [place] shaver was used to perform a partial  medial  meniscectomy.  The notch was normal other than some osteophytes.  The  lateral compartment showed eburnated bone on the far lateral side and  degenerative lateral meniscus tear.  The [facility] shaver was used to  perform an aggressive chondroplasty as well as a partial lateral  meniscectomy.  The knee was then lavaged and closed with interrupted 4-0  nylon sutures, dressed with Xeroform dressing sponges, sterile Webril, and  Ace wrap.   COMPLICATIONS:  None.   DRAINS:  None.   TOURNIQUET:  None.       SDL/MEDQ  D:  08/22/2004  T:  08/22/2004  Job:  [id number]

## 2011-02-12 ENCOUNTER — Encounter: Payer: Self-pay | Admitting: Internal Medicine

## 2011-02-21 ENCOUNTER — Ambulatory Visit: Payer: Self-pay | Admitting: Internal Medicine

## 2011-03-01 ENCOUNTER — Encounter: Payer: Self-pay | Admitting: Internal Medicine

## 2011-03-01 ENCOUNTER — Ambulatory Visit (INDEPENDENT_AMBULATORY_CARE_PROVIDER_SITE_OTHER): Payer: Medicare PPO | Admitting: Internal Medicine

## 2011-03-01 DIAGNOSIS — J069 Acute upper respiratory infection, unspecified: Secondary | ICD-10-CM

## 2011-03-01 DIAGNOSIS — I2510 Atherosclerotic heart disease of native coronary artery without angina pectoris: Secondary | ICD-10-CM

## 2011-03-01 DIAGNOSIS — I4891 Unspecified atrial fibrillation: Secondary | ICD-10-CM

## 2011-03-01 DIAGNOSIS — I509 Heart failure, unspecified: Secondary | ICD-10-CM

## 2011-03-01 NOTE — Progress Notes (Signed)
  Subjective:    Patient ID: Eric [name], male    DOB: Jan 22, 1931, 77 y.o.   MRN: [id number]  HPI 77 year old patient has a history of treated hypertension. He is on chronic Coumadin therapy for paroxysmal atrial fibrillation. He has coronary artery disease and dyslipidemia. The past 3 days she has had some rhinorrhea sneezing low-grade fever and malaise. Denies any sputum production shortness of breath or wheezing. He does have a history of congestive heart failure. Denies any angina symptoms he has been using Tylenol and Allegra with some benefit    Review of Systems  Constitutional: Positive for fatigue. Negative for fever, chills and appetite change.  HENT: Positive for congestion, rhinorrhea and postnasal drip. Negative for hearing loss, ear pain, sore throat, trouble swallowing, neck stiffness, dental problem, voice change and tinnitus.   Eyes: Negative for pain, discharge and visual disturbance.  Respiratory: Positive for cough. Negative for chest tightness, wheezing and stridor.   Cardiovascular: Negative for chest pain, palpitations and leg swelling.  Gastrointestinal: Negative for nausea, vomiting, abdominal pain, diarrhea, constipation, blood in stool and abdominal distention.  Genitourinary: Negative for urgency, hematuria, flank pain, discharge, difficulty urinating and genital sores.  Musculoskeletal: Negative for myalgias, back pain, joint swelling, arthralgias and gait problem.  Skin: Negative for rash.  Neurological: Negative for dizziness, syncope, speech difficulty, weakness, numbness and headaches.  Hematological: Negative for adenopathy. Does not bruise/bleed easily.  Psychiatric/Behavioral: Negative for behavioral problems and dysphoric mood. The patient is not nervous/anxious.        Objective:   Physical Exam  Constitutional: He is oriented to person, place, and time. He appears well-developed and well-nourished. No distress.       Blood pressure 100/70. Weight  220  Wt Readings from Last 3 Encounters: 03/01/11 : 216 lb (97.977 kg) 01/15/11 : 213 lb (96.616 kg) 11/30/10 : 208 lb (94.348 kg)   HENT:  Head: Normocephalic.  Right Ear: External ear normal.  Left Ear: External ear normal.  Eyes: Conjunctivae and EOM are normal.  Neck: Normal range of motion.  Cardiovascular: Normal rate and normal heart sounds.  Exam reveals no gallop.   Pulmonary/Chest: He has rales.       A few bibasilar rales noted  Abdominal: Bowel sounds are normal.  Musculoskeletal: Normal range of motion. He exhibits no edema and no tenderness.  Neurological: He is alert and oriented to person, place, and time.  Psychiatric: He has a normal mood and affect. His behavior is normal.          Assessment & Plan:   Viral URI. Will continue symptomatic treatment with Tylenol and Allegra Congestive heart failure stable After fibrillation stable  We'll recheck in 3 months. We'll call if any clinical worsening

## 2011-03-01 NOTE — Progress Notes (Signed)
Addended by: Bonnye Fava on: 03/01/2011 03:47 PM   Modules accepted: Orders

## 2011-03-01 NOTE — Patient Instructions (Signed)
Get plenty of rest, Drink lots of  clear liquids, and use Tylenol or ibuprofen for fever and discomfort.    Return in 3 months for follow-up   

## 2011-03-01 NOTE — Progress Notes (Signed)
Addended by: Bonnye Fava on: 03/01/2011 03:58 PM   Modules accepted: Orders

## 2011-04-05 ENCOUNTER — Telehealth: Payer: Self-pay | Admitting: Internal Medicine

## 2011-04-05 MED ORDER — WARFARIN SODIUM 7.5 MG PO TABS: ORAL_TABLET | ORAL | Status: DC

## 2011-04-05 MED ORDER — CARBAMAZEPINE 200 MG PO TABS: ORAL_TABLET | ORAL | Status: DC

## 2011-04-05 MED ORDER — WARFARIN SODIUM 5 MG PO TABS: ORAL_TABLET | ORAL | Status: DC

## 2011-04-05 NOTE — Telephone Encounter (Signed)
done

## 2011-04-05 NOTE — Telephone Encounter (Signed)
Walk in--------pt is requesting a 90 day supply each of Warfarin 7.5mg , warfarin 5mg  and Carbamazepine 200mg  to be sent to [facility] in [place]. Pt is out of some of these. He stated that the 90 day supply is the same amount as paying for 30.

## 2011-04-09 ENCOUNTER — Telehealth: Payer: Self-pay | Admitting: *Deleted

## 2011-04-09 NOTE — Telephone Encounter (Signed)
Pt has had diarrhea for 2 days.  He has not tried imodium.  Told pt to try it and if not better by tomorrow or Thursday to call back to se Dr Kirtland Bouchard.  Dr Kirtland Bouchard agreed

## 2011-04-12 ENCOUNTER — Encounter: Payer: Self-pay | Admitting: Internal Medicine

## 2011-04-12 ENCOUNTER — Telehealth: Payer: Self-pay

## 2011-04-12 NOTE — Telephone Encounter (Signed)
Pt aware celebrex from patient assist program are here for pick up

## 2011-05-10 ENCOUNTER — Encounter: Payer: Self-pay | Admitting: Internal Medicine

## 2011-05-31 ENCOUNTER — Encounter: Payer: Self-pay | Admitting: Internal Medicine

## 2011-05-31 ENCOUNTER — Ambulatory Visit (INDEPENDENT_AMBULATORY_CARE_PROVIDER_SITE_OTHER): Payer: Medicare PPO | Admitting: Internal Medicine

## 2011-05-31 VITALS — BP 120/80 | Temp 97.8°F | Wt 218.0 lb

## 2011-05-31 DIAGNOSIS — G500 Trigeminal neuralgia: Secondary | ICD-10-CM

## 2011-05-31 DIAGNOSIS — Z0000 Encounter for general adult medical examination without abnormal findings: Secondary | ICD-10-CM

## 2011-05-31 DIAGNOSIS — I4891 Unspecified atrial fibrillation: Secondary | ICD-10-CM

## 2011-05-31 DIAGNOSIS — I2510 Atherosclerotic heart disease of native coronary artery without angina pectoris: Secondary | ICD-10-CM

## 2011-05-31 DIAGNOSIS — I10 Essential (primary) hypertension: Secondary | ICD-10-CM

## 2011-05-31 DIAGNOSIS — I509 Heart failure, unspecified: Secondary | ICD-10-CM

## 2011-05-31 DIAGNOSIS — E785 Hyperlipidemia, unspecified: Secondary | ICD-10-CM

## 2011-05-31 DIAGNOSIS — Z23 Encounter for immunization: Secondary | ICD-10-CM

## 2011-05-31 LAB — PROTIME-INR: INR: 2.3 — AB (ref 0.9–1.1)

## 2011-05-31 NOTE — Progress Notes (Signed)
  Subjective:    Patient ID: Eric [name], male    DOB: 06-28-1931, 77 y.o.   MRN: [id number]  HPI   77 year old patient who has a history of chronic atrial for relation. He is on chronic Coumadin anticoagulation. His congestive heart failure which has been stable. He has treated hypertension and dyslipidemia. He denies any cardiopulmonary complaints. He also has a history of trigeminal neuralgia. He is on a very low dose of Tegretol at this time. He has had a number of challenges off this medication with recurrent symptoms.    Review of Systems  Constitutional: Negative for fever, chills, appetite change and fatigue.  HENT: Negative for hearing loss, ear pain, congestion, sore throat, trouble swallowing, neck stiffness, dental problem, voice change and tinnitus.   Eyes: Negative for pain, discharge and visual disturbance.  Respiratory: Negative for cough, chest tightness, wheezing and stridor.   Cardiovascular: Negative for chest pain, palpitations and leg swelling.  Gastrointestinal: Negative for nausea, vomiting, abdominal pain, diarrhea, constipation, blood in stool and abdominal distention.  Genitourinary: Negative for urgency, hematuria, flank pain, discharge, difficulty urinating and genital sores.  Musculoskeletal: Negative for myalgias, back pain, joint swelling, arthralgias and gait problem.  Skin: Negative for rash.  Neurological: Negative for dizziness, syncope, speech difficulty, weakness, numbness and headaches.  Hematological: Negative for adenopathy. Does not bruise/bleed easily.  Psychiatric/Behavioral: Negative for behavioral problems and dysphoric mood. The patient is not nervous/anxious.        Objective:   Physical Exam  Constitutional: He is oriented to person, place, and time. He appears well-developed.  HENT:  Head: Normocephalic.  Right Ear: External ear normal.  Left Ear: External ear normal.  Eyes: Conjunctivae and EOM are normal.  Neck: Normal range of  motion.  Cardiovascular: Normal rate and normal heart sounds.        Irregular rhythm with a controlled ventricular response  Pulmonary/Chest: Breath sounds normal.  Abdominal: Bowel sounds are normal.  Musculoskeletal: Normal range of motion. He exhibits no edema and no tenderness.       No peripheral edema  Neurological: He is alert and oriented to person, place, and time.  Psychiatric: He has a normal mood and affect. His behavior is normal.          Assessment & Plan:   Hypertension well controlled Chronic atrial fibrillation Dyslipidemia Chronic Coumadin anticoagulation  Medical regimen unchanged. INR will be checked today

## 2011-05-31 NOTE — Patient Instructions (Addendum)
Limit your sodium (Salt) intake    It is important that you exercise regularly, at least 20 minutes 3 to 4 times per week.  If you develop chest pain or shortness of breath seek  medical attention.  Return in 3 months for follow-up  Same dose of coumadin

## 2011-06-14 ENCOUNTER — Telehealth: Payer: Self-pay

## 2011-06-14 NOTE — Telephone Encounter (Signed)
celebrex mailed to home address - pt unable to get transportation to office - trip. KIK

## 2011-06-20 LAB — PROTIME-INR
INR: 1.9 — ABNORMAL HIGH
Prothrombin Time: 16.2 — ABNORMAL HIGH
Prothrombin Time: 18.3 — ABNORMAL HIGH
Prothrombin Time: 22 — ABNORMAL HIGH

## 2011-06-20 LAB — CBC
HCT: 33.2 — ABNORMAL LOW
HCT: 41.5
Hemoglobin: 11.9 — ABNORMAL LOW
MCHC: 33.4
MCHC: 33.7
MCV: 93.9
MCV: 94.7
MCV: 94.7
Platelets: 156
Platelets: 173
RBC: 3.74 — ABNORMAL LOW
RBC: 4.42
RDW: 14.4
RDW: 14.4
RDW: 14.4
WBC: 10.5

## 2011-06-20 LAB — BASIC METABOLIC PANEL
BUN: 11
BUN: 9
CO2: 31
CO2: 31
Calcium: 8.3 — ABNORMAL LOW
Chloride: 101
Chloride: 98
Creatinine, Ser: 0.96
Creatinine, Ser: 1.05
GFR calc Af Amer: >60
Glucose, Bld: 130 — ABNORMAL HIGH
Glucose, Bld: 150 — ABNORMAL HIGH
Glucose, Bld: 154 — ABNORMAL HIGH
Potassium: 3.8

## 2011-06-20 LAB — URINE CULTURE: Colony Count: 4000

## 2011-06-20 LAB — COMPREHENSIVE METABOLIC PANEL
AST: 24
Albumin: 3.9
Alkaline Phosphatase: 82
BUN: 23
CO2: 29
Chloride: 103
Creatinine, Ser: 0.9
GFR calc Af Amer: >60
GFR calc non Af Amer: >60
Potassium: 3.9
Total Bilirubin: 1.1

## 2011-06-20 LAB — CROSSMATCH
ABO/RH(D): O POS
Antibody Screen: NEGATIVE

## 2011-06-20 LAB — URINALYSIS, ROUTINE W REFLEX MICROSCOPIC
Bilirubin Urine: NEGATIVE
Glucose, UA: NEGATIVE
Hgb urine dipstick: NEGATIVE
Ketones, ur: 15 — AB
Protein, ur: NEGATIVE
pH: 5.5

## 2011-06-20 LAB — DIFFERENTIAL
Basophils Absolute: 0
Basophils Relative: 0
Eosinophils Relative: 1
Monocytes Absolute: 0.9

## 2011-07-05 LAB — BASIC METABOLIC PANEL
BUN: 7
BUN: 8
CO2: 30
CO2: 32
CO2: 33 — ABNORMAL HIGH
Calcium: 7.9 — ABNORMAL LOW
Chloride: 102
Chloride: 104
Creatinine, Ser: 0.91
GFR calc Af Amer: >60
GFR calc Af Amer: >60
GFR calc non Af Amer: >60
Glucose, Bld: 111 — ABNORMAL HIGH
Glucose, Bld: 150 — ABNORMAL HIGH
Potassium: 3.2 — ABNORMAL LOW
Sodium: 143

## 2011-07-05 LAB — CBC
HCT: 30.6 — ABNORMAL LOW
HCT: 34.1 — ABNORMAL LOW
Hemoglobin: 10.4 — ABNORMAL LOW
Hemoglobin: 11.2 — ABNORMAL LOW
MCHC: 33.8
MCHC: 33.9
MCHC: 34.1
MCHC: 34.1
MCV: 93.5
MCV: 94.4
MCV: 94.7
MCV: 95.1
Platelets: 126 — ABNORMAL LOW
Platelets: 189
RBC: 3.23 — ABNORMAL LOW
RBC: 3.49 — ABNORMAL LOW
RBC: 3.67 — ABNORMAL LOW
RDW: 14.7 — ABNORMAL HIGH
RDW: 14.8 — ABNORMAL HIGH
WBC: 7.4
WBC: 9.7

## 2011-07-05 LAB — COMPREHENSIVE METABOLIC PANEL
ALT: 22
Albumin: 2.6 — ABNORMAL LOW
Alkaline Phosphatase: 51
BUN: 8
Calcium: 7.9 — ABNORMAL LOW
Potassium: 3.1 — ABNORMAL LOW
Sodium: 141
Total Protein: 5.1 — ABNORMAL LOW

## 2011-07-05 LAB — PROTIME-INR
INR: 1.6 — ABNORMAL HIGH
Prothrombin Time: 17.4 — ABNORMAL HIGH
Prothrombin Time: 19.3 — ABNORMAL HIGH
Prothrombin Time: 24.7 — ABNORMAL HIGH

## 2011-07-05 LAB — PHOSPHORUS: Phosphorus: 2 — ABNORMAL LOW

## 2011-07-08 LAB — COMPREHENSIVE METABOLIC PANEL
ALT: 18
Calcium: 8.6
Creatinine, Ser: 0.79
Glucose, Bld: 97
Sodium: 141
Total Protein: 6.2

## 2011-07-08 LAB — CROSSMATCH
ABO/RH(D): O POS
Antibody Screen: NEGATIVE

## 2011-07-08 LAB — URINALYSIS, ROUTINE W REFLEX MICROSCOPIC
Nitrite: NEGATIVE
Protein, ur: NEGATIVE
Specific Gravity, Urine: 1.025
Urobilinogen, UA: 0.2

## 2011-07-08 LAB — CBC
Hemoglobin: 13.9
MCHC: 34.1
MCV: 92.6
RDW: 14.2 — ABNORMAL HIGH

## 2011-07-08 LAB — URINE CULTURE

## 2011-07-08 LAB — PROTIME-INR
INR: 1.9 — ABNORMAL HIGH
Prothrombin Time: 22.3 — ABNORMAL HIGH

## 2011-07-08 LAB — DIFFERENTIAL
Eosinophils Absolute: 0.1
Lymphocytes Relative: 22
Lymphs Abs: 1.8
Monocytes Relative: 9
Neutro Abs: 5.3
Neutrophils Relative %: 66

## 2011-07-08 LAB — APTT: aPTT: 35

## 2011-07-26 ENCOUNTER — Inpatient Hospital Stay (HOSPITAL_COMMUNITY)
Admission: EM | Admit: 2011-07-26 | Discharge: 2011-07-28 | DRG: 206 | Disposition: A | Discharge status: Home / Self Care | Payer: Medicare PPO | Attending: Family Medicine | Admitting: Family Medicine

## 2011-07-26 ENCOUNTER — Emergency Department (HOSPITAL_COMMUNITY): Payer: Medicare PPO

## 2011-07-26 ENCOUNTER — Telehealth: Payer: Self-pay | Admitting: *Deleted

## 2011-07-26 DIAGNOSIS — M810 Age-related osteoporosis without current pathological fracture: Secondary | ICD-10-CM | POA: Diagnosis present

## 2011-07-26 DIAGNOSIS — J449 Chronic obstructive pulmonary disease, unspecified: Secondary | ICD-10-CM | POA: Diagnosis present

## 2011-07-26 DIAGNOSIS — R079 Chest pain, unspecified: Secondary | ICD-10-CM

## 2011-07-26 DIAGNOSIS — K250 Acute gastric ulcer with hemorrhage: Secondary | ICD-10-CM

## 2011-07-26 DIAGNOSIS — I2510 Atherosclerotic heart disease of native coronary artery without angina pectoris: Secondary | ICD-10-CM

## 2011-07-26 DIAGNOSIS — Z96649 Presence of unspecified artificial hip joint: Secondary | ICD-10-CM

## 2011-07-26 DIAGNOSIS — J069 Acute upper respiratory infection, unspecified: Secondary | ICD-10-CM

## 2011-07-26 DIAGNOSIS — Z7901 Long term (current) use of anticoagulants: Secondary | ICD-10-CM

## 2011-07-26 DIAGNOSIS — Z888 Allergy status to other drugs, medicaments and biological substances status: Secondary | ICD-10-CM

## 2011-07-26 DIAGNOSIS — M6740 Ganglion, unspecified site: Secondary | ICD-10-CM

## 2011-07-26 DIAGNOSIS — I10 Essential (primary) hypertension: Secondary | ICD-10-CM | POA: Diagnosis present

## 2011-07-26 DIAGNOSIS — N453 Epididymo-orchitis: Secondary | ICD-10-CM

## 2011-07-26 DIAGNOSIS — Z886 Allergy status to analgesic agent status: Secondary | ICD-10-CM

## 2011-07-26 DIAGNOSIS — I482 Chronic atrial fibrillation, unspecified: Secondary | ICD-10-CM | POA: Insufficient documentation

## 2011-07-26 DIAGNOSIS — E785 Hyperlipidemia, unspecified: Secondary | ICD-10-CM | POA: Diagnosis present

## 2011-07-26 DIAGNOSIS — I951 Orthostatic hypotension: Secondary | ICD-10-CM

## 2011-07-26 DIAGNOSIS — L719 Rosacea, unspecified: Secondary | ICD-10-CM | POA: Diagnosis present

## 2011-07-26 DIAGNOSIS — Z882 Allergy status to sulfonamides status: Secondary | ICD-10-CM

## 2011-07-26 DIAGNOSIS — I509 Heart failure, unspecified: Secondary | ICD-10-CM

## 2011-07-26 DIAGNOSIS — K922 Gastrointestinal hemorrhage, unspecified: Secondary | ICD-10-CM

## 2011-07-26 DIAGNOSIS — Z96659 Presence of unspecified artificial knee joint: Secondary | ICD-10-CM

## 2011-07-26 DIAGNOSIS — K625 Hemorrhage of anus and rectum: Secondary | ICD-10-CM

## 2011-07-26 DIAGNOSIS — Z8673 Personal history of transient ischemic attack (TIA), and cerebral infarction without residual deficits: Secondary | ICD-10-CM

## 2011-07-26 DIAGNOSIS — J4489 Other specified chronic obstructive pulmonary disease: Secondary | ICD-10-CM | POA: Diagnosis present

## 2011-07-26 DIAGNOSIS — K219 Gastro-esophageal reflux disease without esophagitis: Secondary | ICD-10-CM

## 2011-07-26 DIAGNOSIS — I4891 Unspecified atrial fibrillation: Secondary | ICD-10-CM | POA: Diagnosis present

## 2011-07-26 DIAGNOSIS — G500 Trigeminal neuralgia: Secondary | ICD-10-CM

## 2011-07-26 DIAGNOSIS — Z8679 Personal history of other diseases of the circulatory system: Secondary | ICD-10-CM

## 2011-07-26 DIAGNOSIS — M940 Chondrocostal junction syndrome [Tietze]: Principal | ICD-10-CM | POA: Diagnosis present

## 2011-07-26 DIAGNOSIS — K224 Dyskinesia of esophagus: Secondary | ICD-10-CM | POA: Diagnosis present

## 2011-07-26 LAB — CBC
HCT: 44.5 % (ref 39.0–52.0)
MCH: 32.5 pg (ref 26.0–34.0)
MCV: 93.3 fL (ref 78.0–100.0)
Platelets: 161 10*3/uL (ref 150–400)
RDW: 13.3 % (ref 11.5–15.5)

## 2011-07-26 LAB — BASIC METABOLIC PANEL
BUN: 15 mg/dL (ref 6–23)
CO2: 26 mEq/L (ref 19–32)
GFR calc non Af Amer: 83 mL/min — ABNORMAL LOW (ref >90)
Glucose, Bld: 116 mg/dL — ABNORMAL HIGH (ref 70–99)
Potassium: 3.7 mEq/L (ref 3.5–5.1)
Sodium: 140 mEq/L (ref 135–145)

## 2011-07-26 LAB — DIFFERENTIAL
Eosinophils Absolute: 0.1 10*3/uL (ref 0.0–0.7)
Eosinophils Relative: 1 % (ref 0–5)
Lymphocytes Relative: 22 % (ref 12–46)
Lymphs Abs: 2 10*3/uL (ref 0.7–4.0)
Monocytes Relative: 10 % (ref 3–12)

## 2011-07-26 LAB — PROTIME-INR
INR: 2 — ABNORMAL HIGH (ref 0.00–1.49)
Prothrombin Time: 23 seconds — ABNORMAL HIGH (ref 11.6–15.2)

## 2011-07-26 NOTE — Telephone Encounter (Signed)
Make sure patient not skipping any doses. Coumadin 12.5 mg Monday and Friday and 15 mg all other Repeat INR 2 weeks.

## 2011-07-26 NOTE — Telephone Encounter (Signed)
[facility] PT:  19.4                                      INR: 1.6.     Taking 12.5 mg. M, W. F/                                                                     15.0 mg. Other days,

## 2011-07-27 LAB — CARDIAC PANEL(CRET KIN+CKTOT+MB+TROPI)
CK, MB: 3 ng/mL (ref 0.3–4.0)
Relative Index: INVALID (ref 0.0–2.5)
Relative Index: INVALID (ref 0.0–2.5)
Relative Index: INVALID (ref 0.0–2.5)
Total CK: 72 U/L (ref 7–232)
Total CK: 73 U/L (ref 7–232)
Total CK: 77 U/L (ref 7–232)
Troponin I: <0.3 ng/mL (ref <0.30)

## 2011-07-27 LAB — COMPREHENSIVE METABOLIC PANEL
AST: 31 U/L (ref 0–37)
Alkaline Phosphatase: 77 U/L (ref 39–117)
CO2: 30 mEq/L (ref 19–32)
Chloride: 103 mEq/L (ref 96–112)
Creatinine, Ser: 0.84 mg/dL (ref 0.50–1.35)
GFR calc non Af Amer: 82 mL/min — ABNORMAL LOW (ref >90)
Potassium: 4.2 mEq/L (ref 3.5–5.1)
Total Bilirubin: 0.4 mg/dL (ref 0.3–1.2)

## 2011-07-27 LAB — DIFFERENTIAL
Basophils Absolute: 0 10*3/uL (ref 0.0–0.1)
Eosinophils Absolute: 0.1 10*3/uL (ref 0.0–0.7)
Eosinophils Relative: 1 % (ref 0–5)

## 2011-07-27 LAB — CBC
MCHC: 34.8 g/dL (ref 30.0–36.0)
MCV: 93.1 fL (ref 78.0–100.0)
Platelets: 159 10*3/uL (ref 150–400)
RDW: 13.3 % (ref 11.5–15.5)
WBC: 8.5 10*3/uL (ref 4.0–10.5)

## 2011-07-27 LAB — PROTIME-INR: INR: 1.94 — ABNORMAL HIGH (ref 0.00–1.49)

## 2011-07-27 LAB — APTT: aPTT: 35 seconds (ref 24–37)

## 2011-07-27 MED ORDER — ATORVASTATIN CALCIUM 40 MG PO TABS
40.0000 mg | ORAL_TABLET | Freq: Every day | ORAL | Status: DC
  Filled 2011-07-27: qty 1

## 2011-07-27 MED ORDER — ASPIRIN EC 325 MG PO TBEC
325.0000 mg | DELAYED_RELEASE_TABLET | Freq: Every day | ORAL | Status: DC
  Administered 2011-07-28: 325 mg via ORAL
  Filled 2011-07-27: qty 1

## 2011-07-27 MED ORDER — WARFARIN SODIUM 7.5 MG PO TABS
7.5000 mg | ORAL_TABLET | ORAL | Status: DC
  Filled 2011-07-27: qty 1

## 2011-07-27 MED ORDER — CARBAMAZEPINE 100 MG PO CHEW
100.0000 mg | CHEWABLE_TABLET | Freq: Two times a day (BID) | ORAL | Status: DC
  Administered 2011-07-27 – 2011-07-28 (×2): 100 mg via ORAL
  Filled 2011-07-27 (×4): qty 1

## 2011-07-27 MED ORDER — BIMATOPROST 0.03 % OP SOLN
1.0000 [drp] | Freq: Every day | OPHTHALMIC | Status: DC
  Filled 2011-07-27: qty 2.5

## 2011-07-27 MED ORDER — NITROGLYCERIN 0.4 MG SL SUBL: 0.4000 mg | SUBLINGUAL_TABLET | SUBLINGUAL | Status: DC | PRN

## 2011-07-27 MED ORDER — WARFARIN SODIUM 5 MG PO TABS: 5.0000 mg | ORAL_TABLET | ORAL | Status: DC

## 2011-07-27 MED ORDER — SODIUM CHLORIDE 0.9 % IJ SOLN: 3.0000 mL | Freq: Two times a day (BID) | INTRAMUSCULAR | Status: DC

## 2011-07-27 MED ORDER — ALUM & MAG HYDROXIDE-SIMETH 200-200-20 MG/5ML PO SUSP
30.0000 mL | Freq: Two times a day (BID) | ORAL | Status: DC
  Administered 2011-07-28: 30 mL via ORAL

## 2011-07-27 MED ORDER — PANTOPRAZOLE SODIUM 40 MG PO TBEC: 40.0000 mg | DELAYED_RELEASE_TABLET | Freq: Every day | ORAL | Status: DC

## 2011-07-27 MED ORDER — METOPROLOL TARTRATE 50 MG PO TABS
50.0000 mg | ORAL_TABLET | Freq: Two times a day (BID) | ORAL | Status: DC
  Administered 2011-07-28: 50 mg via ORAL
  Filled 2011-07-27 (×2): qty 1

## 2011-07-27 MED ORDER — MORPHINE SULFATE 2 MG/ML IJ SOLN: 2.0000 mg | INTRAMUSCULAR | Status: DC | PRN

## 2011-07-27 MED ORDER — CELECOXIB 200 MG PO CAPS
200.0000 mg | ORAL_CAPSULE | Freq: Every day | ORAL | Status: DC
  Filled 2011-07-27: qty 1
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
## 2011-07-28 DIAGNOSIS — R079 Chest pain, unspecified: Secondary | ICD-10-CM

## 2011-07-28 HISTORY — DX: Chest pain, unspecified: R07.9

## 2011-07-28 MED ORDER — ALUM & MAG HYDROXIDE-SIMETH 200-200-20 MG/5ML PO SUSP: 30.0000 mL | Freq: Two times a day (BID) | ORAL | Status: AC

## 2011-07-28 MED ORDER — NITROGLYCERIN 0.4 MG SL SUBL: 0.4000 mg | SUBLINGUAL_TABLET | SUBLINGUAL | Status: DC | PRN

## 2011-07-28 MED ORDER — METOPROLOL SUCCINATE ER 25 MG PO TB24: 25.0000 mg | ORAL_TABLET | Freq: Every day | ORAL | Status: DC

## 2011-07-28 NOTE — Plan of Care (Cosign Needed)
Problem: Discharge Progression Outcomes Goal: Discharge plan in place and appropriate Outcome: Adequate for Discharge Pt and wife gone over discharge instructions with resident, no questions, information hand outs and prescriptions provided.  Comments:  Ave Filter
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
## 2011-07-29 NOTE — Telephone Encounter (Signed)
Spoke with pt - informed to maintain current dose until appt on Thursday with dr. Amador Cunas

## 2011-07-29 NOTE — Telephone Encounter (Signed)
Let's keep his current dose until follow up with Dr Kirtland Bouchard Thursday to avoid further confusion.

## 2011-07-29 NOTE — Telephone Encounter (Signed)
Spoke with pt-- currently taking 5mg  M-W-F  And 7.5mg  other days  Has appt with dr. Amador Cunas on thurs  Please advise

## 2011-08-01 ENCOUNTER — Ambulatory Visit (INDEPENDENT_AMBULATORY_CARE_PROVIDER_SITE_OTHER): Payer: Medicare PPO | Admitting: Internal Medicine

## 2011-08-01 ENCOUNTER — Encounter: Payer: Self-pay | Admitting: Internal Medicine

## 2011-08-01 DIAGNOSIS — R079 Chest pain, unspecified: Secondary | ICD-10-CM

## 2011-08-01 DIAGNOSIS — I509 Heart failure, unspecified: Secondary | ICD-10-CM

## 2011-08-01 DIAGNOSIS — I2510 Atherosclerotic heart disease of native coronary artery without angina pectoris: Secondary | ICD-10-CM

## 2011-08-01 DIAGNOSIS — I4891 Unspecified atrial fibrillation: Secondary | ICD-10-CM

## 2011-08-01 DIAGNOSIS — I10 Essential (primary) hypertension: Secondary | ICD-10-CM

## 2011-08-01 DIAGNOSIS — E785 Hyperlipidemia, unspecified: Secondary | ICD-10-CM

## 2011-08-01 LAB — LIPID PANEL
Cholesterol: 155 mg/dL (ref 0–200)
HDL: 46.2 mg/dL (ref >39.00)

## 2011-08-01 LAB — POCT INR: INR: 1.6

## 2011-08-01 NOTE — Patient Instructions (Addendum)
Return in 3 months for follow-up  Limit your sodium (Salt) intake    Latest dosing instructions   Total Sun Mon Tue Wed Thu Fri Sat   47.5 7.5 mg 5 mg 7.5 mg 7.5 mg 5 mg 7.5 mg 7.5 mg    (5 mg1.5) (5 mg1) (5 mg1.5) (5 mg1.5) (5 mg1) (5 mg1.5) (5 mg1.5)

## 2011-08-01 NOTE — Progress Notes (Signed)
  Subjective:    Patient ID: Eric [name], male    DOB: [birth year], 77 y.o.   MRN: [id number]  HPI  77 year old patient who is seen today post hospital discharge. He was admitted briefly last weekend due 2 atypical chest pain that was thought to be noncardiac. Since his discharge he has done quite well. He does have a history of congestive heart failure and paroxysmal nature fibrillation and is on chronic Coumadin anticoagulation. His INR was subtherapeutic at 1.66 days ago. He is on 5 mg of Coumadin Monday Wednesday Friday and 7.5 mg of Coumadin the other 4 days of the week. Today he feels well.   Review of Systems  Constitutional: Negative for fever, chills, appetite change and fatigue.  HENT: Negative for hearing loss, ear pain, congestion, sore throat, trouble swallowing, neck stiffness, dental problem, voice change and tinnitus.   Eyes: Negative for pain, discharge and visual disturbance.  Respiratory: Negative for cough, chest tightness, wheezing and stridor.   Cardiovascular: Negative for chest pain, palpitations and leg swelling.  Gastrointestinal: Negative for nausea, vomiting, abdominal pain, diarrhea, constipation, blood in stool and abdominal distention.  Genitourinary: Negative for urgency, hematuria, flank pain, discharge, difficulty urinating and genital sores.  Musculoskeletal: Negative for myalgias, back pain, joint swelling, arthralgias and gait problem.  Skin: Negative for rash.  Neurological: Negative for dizziness, syncope, speech difficulty, weakness, numbness and headaches.  Hematological: Negative for adenopathy. Does not bruise/bleed easily.  Psychiatric/Behavioral: Negative for behavioral problems and dysphoric mood. The patient is not nervous/anxious.        Objective:   Physical Exam  Constitutional: He is oriented to person, place, and time. He appears well-developed.  HENT:  Head: Normocephalic.  Right Ear: External ear normal.  Left Ear: External ear  normal.  Eyes: Conjunctivae and EOM are normal.  Neck: Normal range of motion.  Cardiovascular: Normal rate and normal heart sounds.   Pulmonary/Chest: Breath sounds normal.  Abdominal: Bowel sounds are normal.  Musculoskeletal: Normal range of motion. He exhibits no edema and no tenderness.  Neurological: He is alert and oriented to person, place, and time.  Psychiatric: He has a normal mood and affect. His behavior is normal.          Assessment & Plan:   Coronary artery disease. Status post recent admission for atypical chest pain. He remained stable. Hypertension well controlled Dyslipidemia. Subtherapeutic INR. Will repeat today

## 2011-08-23 ENCOUNTER — Emergency Department (HOSPITAL_COMMUNITY): Payer: No Typology Code available for payment source

## 2011-08-23 ENCOUNTER — Emergency Department (HOSPITAL_COMMUNITY)
Admission: EM | Admit: 2011-08-23 | Discharge: 2011-08-23 | Disposition: A | Discharge status: Home / Self Care | Payer: No Typology Code available for payment source | Attending: Emergency Medicine | Admitting: Emergency Medicine

## 2011-08-23 DIAGNOSIS — I2510 Atherosclerotic heart disease of native coronary artery without angina pectoris: Secondary | ICD-10-CM | POA: Insufficient documentation

## 2011-08-23 DIAGNOSIS — R6889 Other general symptoms and signs: Secondary | ICD-10-CM | POA: Insufficient documentation

## 2011-08-23 DIAGNOSIS — Z79899 Other long term (current) drug therapy: Secondary | ICD-10-CM | POA: Insufficient documentation

## 2011-08-23 DIAGNOSIS — S20219A Contusion of unspecified front wall of thorax, initial encounter: Secondary | ICD-10-CM | POA: Insufficient documentation

## 2011-08-23 DIAGNOSIS — R071 Chest pain on breathing: Secondary | ICD-10-CM | POA: Insufficient documentation

## 2011-08-23 DIAGNOSIS — I509 Heart failure, unspecified: Secondary | ICD-10-CM | POA: Insufficient documentation

## 2011-08-23 DIAGNOSIS — Z9889 Other specified postprocedural states: Secondary | ICD-10-CM | POA: Insufficient documentation

## 2011-08-23 DIAGNOSIS — Z7901 Long term (current) use of anticoagulants: Secondary | ICD-10-CM | POA: Insufficient documentation

## 2011-08-23 DIAGNOSIS — I4891 Unspecified atrial fibrillation: Secondary | ICD-10-CM | POA: Insufficient documentation

## 2011-08-23 DIAGNOSIS — M542 Cervicalgia: Secondary | ICD-10-CM | POA: Insufficient documentation

## 2011-08-23 DIAGNOSIS — I4510 Unspecified right bundle-branch block: Secondary | ICD-10-CM | POA: Insufficient documentation

## 2011-08-23 DIAGNOSIS — K219 Gastro-esophageal reflux disease without esophagitis: Secondary | ICD-10-CM | POA: Insufficient documentation

## 2011-08-23 DIAGNOSIS — I10 Essential (primary) hypertension: Secondary | ICD-10-CM | POA: Insufficient documentation

## 2011-08-23 DIAGNOSIS — E785 Hyperlipidemia, unspecified: Secondary | ICD-10-CM | POA: Insufficient documentation

## 2011-08-23 LAB — POCT I-STAT, CHEM 8
Calcium, Ion: 1.13 mmol/L (ref 1.12–1.32)
Glucose, Bld: 104 mg/dL — ABNORMAL HIGH (ref 70–99)
HCT: 44 % (ref 39.0–52.0)
Hemoglobin: 15 g/dL (ref 13.0–17.0)
Potassium: 3.8 mEq/L (ref 3.5–5.1)
TCO2: 27 mmol/L (ref 0–100)

## 2011-08-23 LAB — PROTIME-INR: Prothrombin Time: 21 seconds — ABNORMAL HIGH (ref 11.6–15.2)

## 2011-08-23 LAB — URINALYSIS, MICROSCOPIC ONLY
Glucose, UA: NEGATIVE mg/dL
Ketones, ur: NEGATIVE mg/dL
Leukocytes, UA: NEGATIVE
Nitrite: NEGATIVE
Protein, ur: NEGATIVE mg/dL
Urobilinogen, UA: 0.2 mg/dL (ref 0.0–1.0)

## 2011-08-23 LAB — CBC
MCH: 32 pg (ref 26.0–34.0)
MCHC: 34.1 g/dL (ref 30.0–36.0)
Platelets: 145 10*3/uL — ABNORMAL LOW (ref 150–400)
RDW: 13.5 % (ref 11.5–15.5)

## 2011-08-23 LAB — SAMPLE TO BLOOD BANK

## 2011-08-23 LAB — COMPREHENSIVE METABOLIC PANEL
ALT: 16 U/L (ref 0–53)
AST: 30 U/L (ref 0–37)
Alkaline Phosphatase: 84 U/L (ref 39–117)
CO2: 27 mEq/L (ref 19–32)
Chloride: 107 mEq/L (ref 96–112)
GFR calc Af Amer: >90 mL/min (ref >90)
GFR calc non Af Amer: 83 mL/min — ABNORMAL LOW (ref >90)
Glucose, Bld: 108 mg/dL — ABNORMAL HIGH (ref 70–99)
Sodium: 140 mEq/L (ref 135–145)
Total Bilirubin: 0.4 mg/dL (ref 0.3–1.2)

## 2011-08-23 MED ORDER — MORPHINE SULFATE 4 MG/ML IJ SOLN
4.0000 mg | Freq: Once | INTRAMUSCULAR | Status: AC
  Administered 2011-08-23: 4 mg via INTRAVENOUS

## 2011-08-23 MED ORDER — MORPHINE SULFATE 4 MG/ML IJ SOLN
INTRAMUSCULAR | Status: AC
  Filled 2011-08-23: qty 1

## 2011-08-23 MED ORDER — MORPHINE SULFATE 4 MG/ML IJ SOLN: 4.0000 mg | Freq: Once | INTRAMUSCULAR | Status: DC

## 2011-08-23 MED ORDER — DIPHENHYDRAMINE HCL 50 MG/ML IJ SOLN
25.0000 mg | Freq: Once | INTRAMUSCULAR | Status: AC
  Administered 2011-08-23: 25 mg via INTRAVENOUS

## 2011-08-23 MED ORDER — IOHEXOL 300 MG/ML  SOLN
100.0000 mL | Freq: Once | INTRAMUSCULAR | Status: AC | PRN
  Administered 2011-08-23: 100 mL via INTRAVENOUS

## 2011-08-23 MED ORDER — TRAMADOL HCL 50 MG PO TABS: 50.0000 mg | ORAL_TABLET | Freq: Four times a day (QID) | ORAL | Status: DC | PRN

## 2011-08-23 MED ORDER — MORPHINE SULFATE 4 MG/ML IJ SOLN
INTRAMUSCULAR | Status: AC
  Administered 2011-08-23: 4 mg via INTRAVENOUS
  Filled 2011-08-23: qty 1

## 2011-08-23 MED ORDER — METHYLPREDNISOLONE SODIUM SUCC 125 MG IJ SOLR
INTRAMUSCULAR | Status: AC
  Filled 2011-08-23: qty 2

## 2011-08-23 MED ORDER — DIPHENHYDRAMINE HCL 50 MG/ML IJ SOLN
INTRAMUSCULAR | Status: AC
  Administered 2011-08-23: 25 mg via INTRAVENOUS
  Filled 2011-08-23: qty 1

## 2011-08-23 MED ORDER — FAMOTIDINE IN NACL 20-0.9 MG/50ML-% IV SOLN
20.0000 mg | Freq: Once | INTRAVENOUS | Status: AC
  Administered 2011-08-23: 20 mg via INTRAVENOUS

## 2011-08-23 MED ORDER — METHYLPREDNISOLONE SODIUM SUCC 125 MG IJ SOLR
125.0000 mg | Freq: Once | INTRAMUSCULAR | Status: AC
  Administered 2011-08-23: 125 mg via INTRAVENOUS

## 2011-08-23 MED ORDER — FAMOTIDINE IN NACL 20-0.9 MG/50ML-% IV SOLN
INTRAVENOUS | Status: AC
  Filled 2011-08-23: qty 50

## 2011-08-23 NOTE — ED Notes (Signed)
Transported to CT 
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
## 2011-08-23 NOTE — ED Notes (Signed)
Patient C/O having difficulty speaking. States that it feels like his throat is swelling,. Dr Preston Fleeting notified

## 2011-08-23 NOTE — ED Notes (Signed)
Dr. Lindie Spruce at the bedside to see pt at this time. Assessing pt.

## 2011-08-23 NOTE — ED Notes (Signed)
Patient states that the swelling in his throat is not getting worse and might be a little better.  Speech is clear. Patient able to swallow.

## 2011-08-23 NOTE — ED Notes (Signed)
Dr. Glick at bedside.  

## 2011-08-23 NOTE — ED Notes (Signed)
C-Collar removed by MD

## 2011-08-23 NOTE — ED Notes (Signed)
Patient C/O pain in his right upper quadrant now.
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
## 2011-08-23 NOTE — ED Notes (Signed)
Returned from CT.

## 2011-08-23 NOTE — ED Notes (Signed)
Chaplain at bedside

## 2011-08-23 NOTE — ED Notes (Signed)
Pt alert and oriented 4 and in NAD.  Pt resting quietly.

## 2011-08-26 ENCOUNTER — Telehealth: Payer: Self-pay

## 2011-08-26 NOTE — Telephone Encounter (Signed)
Called pt to make aware to continue present dose of coumadin and check INR in 2 weeks.
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
## 2011-08-28 ENCOUNTER — Ambulatory Visit (INDEPENDENT_AMBULATORY_CARE_PROVIDER_SITE_OTHER): Payer: Medicare PPO | Admitting: Internal Medicine

## 2011-08-28 ENCOUNTER — Encounter: Payer: Self-pay | Admitting: Internal Medicine

## 2011-08-28 ENCOUNTER — Other Ambulatory Visit: Payer: Self-pay

## 2011-08-28 DIAGNOSIS — I10 Essential (primary) hypertension: Secondary | ICD-10-CM

## 2011-08-28 DIAGNOSIS — J069 Acute upper respiratory infection, unspecified: Secondary | ICD-10-CM

## 2011-08-28 MED ORDER — TRAMADOL HCL 50 MG PO TABS: 50.0000 mg | ORAL_TABLET | Freq: Four times a day (QID) | ORAL | Status: AC | PRN

## 2011-08-28 MED ORDER — METOPROLOL SUCCINATE ER 25 MG PO TB24: 25.0000 mg | ORAL_TABLET | Freq: Every day | ORAL | Status: DC

## 2011-08-28 NOTE — Patient Instructions (Signed)
You  may move around, but avoid painful motions and activities.  Apply ice to the sore area for 15 to 20 minutes 3 or 4 times daily for the next two to 3 days.  Call or return to clinic prn if these symptoms worsen or fail to improve as anticipated.  
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
## 2011-08-30 ENCOUNTER — Ambulatory Visit: Payer: Medicare PPO | Admitting: Internal Medicine

## 2011-09-05 ENCOUNTER — Ambulatory Visit (INDEPENDENT_AMBULATORY_CARE_PROVIDER_SITE_OTHER): Payer: Medicare PPO | Admitting: Internal Medicine

## 2011-09-05 ENCOUNTER — Encounter: Payer: Self-pay | Admitting: Internal Medicine

## 2011-09-05 DIAGNOSIS — R079 Chest pain, unspecified: Secondary | ICD-10-CM
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
## 2011-09-05 NOTE — Patient Instructions (Signed)
Limit your sodium (Salt) intake  Call or return to clinic prn if these symptoms worsen or fail to improve as anticipated.   

## 2011-10-10 ENCOUNTER — Ambulatory Visit: Payer: Medicare PPO | Admitting: Internal Medicine

## 2011-10-22 ENCOUNTER — Ambulatory Visit (INDEPENDENT_AMBULATORY_CARE_PROVIDER_SITE_OTHER): Payer: Medicare PPO | Admitting: Internal Medicine

## 2011-10-22 ENCOUNTER — Encounter: Payer: Self-pay | Admitting: Internal Medicine

## 2011-10-22 ENCOUNTER — Ambulatory Visit (INDEPENDENT_AMBULATORY_CARE_PROVIDER_SITE_OTHER)
Admission: RE | Admit: 2011-10-22 | Discharge: 2011-10-22 | Disposition: A | Discharge status: Home / Self Care | Payer: Medicare PPO | Source: Ambulatory Visit | Attending: Internal Medicine | Admitting: Internal Medicine

## 2011-10-22 VITALS — BP 120/78 | HR 90 | Temp 97.8°F | Wt 220.0 lb

## 2011-10-22 DIAGNOSIS — I10 Essential (primary) hypertension: Secondary | ICD-10-CM

## 2011-10-22 DIAGNOSIS — M25519 Pain in unspecified shoulder: Secondary | ICD-10-CM

## 2011-10-22 MED ORDER — CARBAMAZEPINE 200 MG PO TABS: ORAL_TABLET | ORAL | Status: DC

## 2011-10-22 MED ORDER — TRAMADOL HCL 50 MG PO TABS: 50.0000 mg | ORAL_TABLET | Freq: Three times a day (TID) | ORAL | Status: AC | PRN

## 2011-10-22 NOTE — Patient Instructions (Signed)
X-rays as discussed  Call or return to clinic prn if these symptoms worsen or fail to improve as anticipated.  

## 2011-10-22 NOTE — Progress Notes (Signed)
Quick Note:  Left a message for pt to call back. ______

## 2011-10-23 ENCOUNTER — Encounter: Payer: Self-pay | Admitting: Internal Medicine
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
## 2011-10-23 NOTE — Progress Notes (Signed)
Quick Note:  Pt aware ______ 

## 2011-10-24 ENCOUNTER — Telehealth: Payer: Self-pay | Admitting: Internal Medicine

## 2011-10-24 NOTE — Telephone Encounter (Signed)
Pt said that he checked with [facility] in [place] and they did not rcv script for carbamazepine (TEGRETOL) 200 MG tablet. Pls call in to pharmacy.   Also pt said that a mail order pharmacy was suppose to be sending some of pts meds to [facility]. Pls let pt know when theses meds arrive.

## 2011-10-25 MED ORDER — CARBAMAZEPINE 200 MG PO TABS: ORAL_TABLET | ORAL | Status: DC

## 2011-10-25 NOTE — Telephone Encounter (Signed)
Rx reprinted and faxed. No refills received from [facility].

## 2011-10-25 NOTE — Telephone Encounter (Signed)
Pt called back again to check on status of getting the script for carbamazepine (Tegretol) 200 mg to [facility] in [place].   Also checking on status of mail order meds that were suppose to be delived to [facility].

## 2011-11-01 ENCOUNTER — Ambulatory Visit: Payer: Medicare PPO | Admitting: Internal Medicine

## 2011-11-11 ENCOUNTER — Ambulatory Visit: Payer: Medicare PPO | Admitting: Internal Medicine

## 2011-11-12 ENCOUNTER — Encounter: Payer: Self-pay | Admitting: Internal Medicine

## 2011-11-12 ENCOUNTER — Ambulatory Visit (INDEPENDENT_AMBULATORY_CARE_PROVIDER_SITE_OTHER): Payer: Medicare PPO | Admitting: Internal Medicine

## 2011-11-12 DIAGNOSIS — I4891 Unspecified atrial fibrillation: Secondary | ICD-10-CM

## 2011-11-12 DIAGNOSIS — M1990 Unspecified osteoarthritis, unspecified site: Secondary | ICD-10-CM

## 2011-11-12 DIAGNOSIS — I10 Essential (primary) hypertension: Secondary | ICD-10-CM

## 2011-11-12 MED ORDER — TRAMADOL HCL 50 MG PO TABS: 50.0000 mg | ORAL_TABLET | Freq: Three times a day (TID) | ORAL | Status: AC | PRN
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
## 2011-11-12 NOTE — Patient Instructions (Signed)
Follow up with orthopedics if right knee pain fails to improve  Limit your sodium (Salt) intake  Return in 3 months for follow-up

## 2011-11-15 ENCOUNTER — Encounter: Payer: Self-pay | Admitting: Internal Medicine

## 2011-12-05 ENCOUNTER — Telehealth: Payer: Self-pay | Admitting: Internal Medicine

## 2011-12-05 NOTE — Telephone Encounter (Signed)
Spoke with pt - infomed that the ins co.will need to fax [place] requets and release of information for what the need.

## 2011-12-05 NOTE — Telephone Encounter (Signed)
Pt requesting results of x-ray be sent to [facility]  Fax # [phone] [facility] needs results before they will make payment. Any questions please contact pt

## 2011-12-05 NOTE — Telephone Encounter (Signed)
Pt requesting release of info form mail to home due to long trip to office .

## 2011-12-05 NOTE — Telephone Encounter (Signed)
Pt would like kim to return his call today. Pt has questions concerning records etc

## 2012-01-07 ENCOUNTER — Ambulatory Visit (INDEPENDENT_AMBULATORY_CARE_PROVIDER_SITE_OTHER): Payer: Medicare PPO | Admitting: Internal Medicine

## 2012-01-07 ENCOUNTER — Encounter: Payer: Self-pay | Admitting: Internal Medicine

## 2012-01-07 VITALS — BP 120/80 | Temp 97.8°F | Wt 222.0 lb

## 2012-01-07 DIAGNOSIS — I2510 Atherosclerotic heart disease of native coronary artery without angina pectoris: Secondary | ICD-10-CM

## 2012-01-07 DIAGNOSIS — I10 Essential (primary) hypertension: Secondary | ICD-10-CM

## 2012-01-07 DIAGNOSIS — M1990 Unspecified osteoarthritis, unspecified site: Secondary | ICD-10-CM

## 2012-01-07 DIAGNOSIS — I4891 Unspecified atrial fibrillation: Secondary | ICD-10-CM

## 2012-01-07 DIAGNOSIS — I509 Heart failure, unspecified: Secondary | ICD-10-CM

## 2012-01-07 LAB — POCT INR: INR: 1.7
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
## 2012-01-07 NOTE — Patient Instructions (Addendum)
Limit your sodium (Salt) intake    It is important that you exercise regularly, at least 20 minutes 3 to 4 times per week.  If you develop chest pain or shortness of breath seek  medical attention.  Return in 4 months for follow-up    Latest dosing instructions   Total Sun Mon Tue Wed Thu Fri Sat   47.5 7.5 mg 5 mg 7.5 mg 7.5 mg 5 mg 7.5 mg 7.5 mg    (5 mg1.5) (5 mg1) (5 mg1.5) (5 mg1.5) (5 mg1) (5 mg1.5) (5 mg1.5)

## 2012-02-03 ENCOUNTER — Telehealth: Payer: Self-pay | Admitting: Family Medicine

## 2012-02-03 ENCOUNTER — Telehealth: Payer: Self-pay | Admitting: Internal Medicine

## 2012-02-03 MED ORDER — WARFARIN SODIUM 7.5 MG PO TABS: ORAL_TABLET | ORAL | Status: DC

## 2012-02-03 NOTE — Telephone Encounter (Signed)
Attempt to call- "mailbox unable to accept msg at this time" - I sent coumadin rx to [facility] at lunchtime. KIK

## 2012-02-03 NOTE — Telephone Encounter (Signed)
Pt called at 2:07 - pulled from Triage vmail. He wants Selena Batten to call him, but it was hard to understand the message as to [facility] he wants a call. It sounded like it was about his Coumadin.

## 2012-02-03 NOTE — Telephone Encounter (Signed)
Patient called stating that he only has enough coumadin for 5 days and he should have 7.5 days. The pharmacy asked him to call for a new rx stating this. Please assist. [facility] in [place] [phone]

## 2012-02-03 NOTE — Telephone Encounter (Signed)
Pt is aware coumadin was call [facility]

## 2012-02-03 NOTE — Telephone Encounter (Signed)
done

## 2012-02-13 ENCOUNTER — Encounter: Payer: Self-pay | Admitting: Internal Medicine

## 2012-03-09 ENCOUNTER — Telehealth: Payer: Self-pay | Admitting: Family

## 2012-03-09 NOTE — Telephone Encounter (Signed)
Please advise patient to take an extra 1/2 tab of his coumadin. Recheck INR in 4 weeks.

## 2012-03-09 NOTE — Telephone Encounter (Signed)
Phone not accepting messages and unable to reach on work phone. Will try again later

## 2012-03-10 NOTE — Telephone Encounter (Signed)
Pt aware and notes that he will have INR drawn in [place] in 4 weeks

## 2012-04-06 ENCOUNTER — Telehealth: Payer: Self-pay | Admitting: Family

## 2012-04-06 ENCOUNTER — Ambulatory Visit (INDEPENDENT_AMBULATORY_CARE_PROVIDER_SITE_OTHER): Payer: Self-pay | Admitting: Family

## 2012-04-06 DIAGNOSIS — I4891 Unspecified atrial fibrillation: Secondary | ICD-10-CM

## 2012-04-06 NOTE — Patient Instructions (Addendum)
5mg  On Monday and Thursdays and 7.5mg . All other days of the week   Latest dosing instructions   Total Sun Mon Tue Wed Thu Fri Sat   47.5 7.5 mg 5 mg 7.5 mg 7.5 mg 5 mg 7.5 mg 7.5 mg    (5 mg1.5) (5 mg1) (5 mg1.5) (5 mg1.5) (5 mg1) (5 mg1.5) (5 mg1.5)

## 2012-04-06 NOTE — Telephone Encounter (Signed)
Continue the same dose.  5mg  On Monday and Thursdays and 7.5mg  all other days of the week. Recheck in 3 weeks.

## 2012-04-07 NOTE — Telephone Encounter (Signed)
Spoke with pt- informd of change to meds - he has both at home - recheck lab in 3 wks

## 2012-04-14 ENCOUNTER — Telehealth: Payer: Self-pay | Admitting: Internal Medicine

## 2012-04-14 MED ORDER — ATORVASTATIN CALCIUM 40 MG PO TABS: 40.0000 mg | ORAL_TABLET | Freq: Every day | ORAL | Status: DC

## 2012-04-14 NOTE — Telephone Encounter (Signed)
Pt needs atorvastatin 40 mg #30 call into [facility] [phone]

## 2012-04-14 NOTE — Telephone Encounter (Signed)
*  Presenting Problem: Pt. calling, had a tick bite 1 week ago.  Has slight redness on the ankle area.  He removed the tick.  Has itching.  No fever, h/a or rash.   Triaged per Bites and Sting protocal.  Home care given.

## 2012-04-14 NOTE — Telephone Encounter (Signed)
Done

## 2012-04-15 ENCOUNTER — Telehealth: Payer: Self-pay | Admitting: Family Medicine

## 2012-04-15 NOTE — Telephone Encounter (Signed)
[facility] Triage Call Report Triage Record Num: [id number] Operator: Rebeca Allegra Patient Name: Eric [name] Call Date & Time: 04/14/2012 5:05:41PM Patient Phone: [phone] PCP: Patient Gender: Male PCP Fax : Patient DOB: [birth year] [facility] Name: [facility] Reason for Call: Caller: Eric [name]/Patient; PCP: [name]; CB#: [phone]; Call regarding Medication Issue; Medication(s): Lipitor; pt uses the mail order rx refill service, and forgot to mail it out. Pt is currently out of Lipitor. RN verified rx via [facility]. Dr. Amador Cunas has sent a new rx to [facility] today, 04/14/12. Protocol(s) Used: Office Note Recommended Outcome per Protocol: Information Noted and Sent to Office Reason for Outcome: Caller information to office Care Advice: ~ 07/

## 2012-05-07 ENCOUNTER — Ambulatory Visit: Payer: Medicare PPO | Admitting: Internal Medicine

## 2012-05-11 ENCOUNTER — Ambulatory Visit (INDEPENDENT_AMBULATORY_CARE_PROVIDER_SITE_OTHER): Payer: Medicare PPO | Admitting: Internal Medicine

## 2012-05-11 ENCOUNTER — Encounter: Payer: Self-pay | Admitting: Internal Medicine

## 2012-05-11 VITALS — BP 118/70 | Temp 98.2°F | Wt 222.0 lb

## 2012-05-11 DIAGNOSIS — I4891 Unspecified atrial fibrillation: Secondary | ICD-10-CM

## 2012-05-11 DIAGNOSIS — I10 Essential (primary) hypertension: Secondary | ICD-10-CM

## 2012-05-11 DIAGNOSIS — I509 Heart failure, unspecified: Secondary | ICD-10-CM

## 2012-05-11 DIAGNOSIS — M1990 Unspecified osteoarthritis, unspecified site: Secondary | ICD-10-CM

## 2012-05-11 NOTE — Progress Notes (Signed)
Subjective:    Patient ID: Eric [name], male    DOB: 1931-01-18, 78 y.o.   MRN: [id number]  HPI  78 year old patient who is seen today for followup. He has a history of paroxysmal atrial fibrillation remains on chronic Coumadin anticoagulation. He is treated hypertension and dyslipidemia. He is doing quite well. He has a history of congestive heart failure but denies any symptoms. He has coronary artery disease. Medical regimen includes daily Nexium. He does have a history of prior GI bleeding  Past Medical History  Diagnosis Date  . CAD (coronary artery disease)   . GERD (gastroesophageal reflux disease)   . Hyperlipidemia   . Hypertension   . Atrial fibrillation   . Trigeminal neuralgia   . CHF (congestive heart failure)   . NSAID-induced gastric ulcer     History   Social History  . Marital Status: Married    Spouse Name: N/A    Number of Children: N/A  . Years of Education: N/A   Occupational History  . Not on file.   Social History Main Topics  . Smoking status: Former Smoker    Quit date: 09/23/1958  . Smokeless tobacco: Former Neurosurgeon   Comment: quit smoking1980-quit chewing 6 months  . Alcohol Use: No  . Drug Use: No  . Sexually Active: Not on file   Other Topics Concern  . Not on file   Social History Narrative  . No narrative on file    Past Surgical History  Procedure Date  . Total knee arthroplasty June 2009    Left  . Shoulder surgery     Left  . Cholecystectomy   . Hip surgery August 2008    Right total hip replacement  . Esophagogastroduodenoscopy 2011    No family history on file.  Allergies  Allergen Reactions  . Enoxaparin Sodium (Enoxaparin Sodium) Anaphylaxis and Other (See Comments)    angioedema  . Codeine Other (See Comments)    hallucinations  . Oxycodone Hcl (Oxycodone Hcl) Other (See Comments)    hallucinations  . Vicodin (Hydrocodone-Acetaminophen) Other (See Comments)    hallucinations  . Sulfonamide Derivatives Rash     Current Outpatient Prescriptions on File Prior to Visit  Medication Sig Dispense Refill  . acetaminophen (TYLENOL) 325 MG tablet Take 650 mg by mouth every 4 (four) hours as needed. pain      . albuterol (PROVENTIL HFA) 108 (90 BASE) MCG/ACT inhaler Inhale 2 puffs into the lungs every 4 (four) hours as needed. For shortness of breath       . Ascorbic Acid (VITAMIN C) 500 MG tablet Take 500 mg by mouth 2 (two) times daily.       Marland Kitchen atorvastatin (LIPITOR) 40 MG tablet Take 1 tablet (40 mg total) by mouth at bedtime.  90 tablet  1  . benzonatate (TESSALON) 100 MG capsule Take 100 mg by mouth every 8 (eight) hours.       . Bimatoprost (LUMIGAN) 0.01 % SOLN Place 1 drop into both eyes at bedtime. Both eyes at hour of sleep      . Calcium Carbonate-Vitamin D (CALCIUM + D) 600-200 MG-UNIT per tablet Take 1 tablet by mouth daily.       . carbamazepine (TEGRETOL) 200 MG tablet One  tablet twice daily  90 tablet  4  . Difluprednate (DUREZOL) 0.05 % EMUL Place 1 drop into both eyes daily.       Marland Kitchen docusate sodium (COLACE) 100 MG capsule Take 100 mg by mouth  2 (two) times daily.       Marland Kitchen doxycycline (VIBRAMYCIN) 50 MG capsule Take 50 mg by mouth 2 (two) times daily.       Marland Kitchen esomeprazole (NEXIUM) 40 MG capsule Take 40 mg by mouth daily.       . fexofenadine (ALLEGRA) 180 MG tablet Take 180 mg by mouth daily.       . metoprolol succinate (TOPROL XL) 25 MG 24 hr tablet Take 1 tablet (25 mg total) by mouth daily.  30 tablet  5  . multivitamin (THERAGRAN) per tablet Take 1 tablet by mouth daily.       . nitroGLYCERIN (NITROSTAT) 0.4 MG SL tablet Place 1 tablet (0.4 mg total) under the tongue every 5 (five) minutes x 3 doses as needed for chest pain.  30 tablet  0  . warfarin (COUMADIN) 5 MG tablet Take 5 mg by mouth 2 (two) times a week. 5mg  on Mondays & Thursday       . warfarin (COUMADIN) 7.5 MG tablet Takes 7.5mg  on Tuesday, Wednesday, Friday, Saturday & Sundays  90 tablet  3  . DISCONTD: atorvastatin  (LIPITOR) 40 MG tablet Take 1 tablet (40 mg total) by mouth at bedtime.  90 tablet  0    BP 118/70  Temp 98.2 F (36.8 C) (Oral)  Wt 222 lb (100.699 kg)       Review of Systems  Constitutional: Negative for fever, chills, appetite change and fatigue.  HENT: Negative for hearing loss, ear pain, congestion, sore throat, trouble swallowing, neck stiffness, dental problem, voice change and tinnitus.   Eyes: Negative for pain, discharge and visual disturbance.  Respiratory: Negative for cough, chest tightness, wheezing and stridor.   Cardiovascular: Negative for chest pain, palpitations and leg swelling.  Gastrointestinal: Negative for nausea, vomiting, abdominal pain, diarrhea, constipation, blood in stool and abdominal distention.  Genitourinary: Negative for urgency, hematuria, flank pain, discharge, difficulty urinating and genital sores.  Musculoskeletal: Negative for myalgias, back pain, joint swelling, arthralgias and gait problem.  Skin: Negative for rash.  Neurological: Negative for dizziness, syncope, speech difficulty, weakness, numbness and headaches.  Hematological: Negative for adenopathy. Does not bruise/bleed easily.  Psychiatric/Behavioral: Negative for behavioral problems and dysphoric mood. The patient is not nervous/anxious.        Objective:   Physical Exam  Constitutional: He is oriented to person, place, and time. He appears well-developed.  HENT:  Head: Normocephalic.  Right Ear: External ear normal.  Left Ear: External ear normal.  Eyes: Conjunctivae and EOM are normal.  Neck: Normal range of motion.  Cardiovascular: Normal rate and normal heart sounds.   Pulmonary/Chest: Breath sounds normal.  Abdominal: Bowel sounds are normal.  Musculoskeletal: Normal range of motion. He exhibits no edema and no tenderness.  Neurological: He is alert and oriented to person, place, and time.  Psychiatric: He has a normal mood and affect. His behavior is normal.           Assessment & Plan:   Hypertension well controlled Paroxysmal atrial fibrillation  Osteoarthritis  We'll check an INR Medical regimen unchanged We'll see in 4 months for annual physical

## 2012-05-11 NOTE — Patient Instructions (Addendum)
Limit your sodium (Salt) intake     It is important that you exercise regularly, at least 20 minutes 3 to 4 times per week.  If you develop chest pain or shortness of breath seek  medical attention.  You need to lose weight.  Consider a lower calorie diet and regular exercise.    Latest dosing instructions   Total Sun Mon Tue Wed Thu Fri Sat   47.5 7.5 mg 5 mg 7.5 mg 7.5 mg 5 mg 7.5 mg 7.5 mg    (5 mg1.5) (5 mg1) (5 mg1.5) (5 mg1.5) (5 mg1) (5 mg1.5) (5 mg1.5)       Called [facility] for instructions.

## 2012-05-18 ENCOUNTER — Telehealth: Payer: Self-pay | Admitting: Family

## 2012-05-18 NOTE — Telephone Encounter (Signed)
Please call and schedule patient for a 4 week Coumadin appointment.

## 2012-06-01 ENCOUNTER — Telehealth: Payer: Self-pay | Admitting: Family

## 2012-06-01 ENCOUNTER — Ambulatory Visit: Payer: Self-pay | Admitting: Family

## 2012-06-01 DIAGNOSIS — I4891 Unspecified atrial fibrillation: Secondary | ICD-10-CM

## 2012-06-01 NOTE — Telephone Encounter (Signed)
Increase Coumadin to 5mg  on Monday only. All other days, 7.5mg  (1 1/2 tab). All other days of the week. Recheck INR in 2 weeks.

## 2012-06-01 NOTE — Patient Instructions (Signed)
Increase Coumadin to 5mg  on Monday only. All other days, 7.5mg  (1 1/2 tab). All other days of the week. Recheck INR in 2 weeks.     Latest dosing instructions   Total Sun Mon Tue Wed Thu Fri Sat   50 7.5 mg 7.5 mg 7.5 mg 7.5 mg 5 mg 7.5 mg 7.5 mg    (5 mg1.5) (5 mg1.5) (5 mg1.5) (5 mg1.5) (5 mg1) (5 mg1.5) (5 mg1.5)

## 2012-06-01 NOTE — Telephone Encounter (Signed)
Pt aware and verbalized understanding.  

## 2012-06-16 ENCOUNTER — Ambulatory Visit (INDEPENDENT_AMBULATORY_CARE_PROVIDER_SITE_OTHER): Payer: Medicare PPO

## 2012-06-16 ENCOUNTER — Telehealth: Payer: Self-pay | Admitting: Family

## 2012-06-16 DIAGNOSIS — Z23 Encounter for immunization: Secondary | ICD-10-CM

## 2012-06-16 NOTE — Telephone Encounter (Signed)
Needs INR checked asap. Abnormal last office visit

## 2012-06-16 NOTE — Telephone Encounter (Signed)
Patient was in today for a flu shot and i don't think he is back home, however a message was left on his answering machine to call back and schedule

## 2012-07-06 ENCOUNTER — Telehealth: Payer: Self-pay | Admitting: Family

## 2012-07-06 ENCOUNTER — Ambulatory Visit (INDEPENDENT_AMBULATORY_CARE_PROVIDER_SITE_OTHER): Payer: Self-pay | Admitting: Family

## 2012-07-06 DIAGNOSIS — I4891 Unspecified atrial fibrillation: Secondary | ICD-10-CM

## 2012-07-06 NOTE — Telephone Encounter (Signed)
Pt aware.

## 2012-07-06 NOTE — Telephone Encounter (Signed)
  Latest dosing instructions   Total Sun Mon Tue Wed Thu Fri Sat   50 7.5 mg 7.5 mg 7.5 mg 7.5 mg 5 mg 7.5 mg 7.5 mg    (5 mg1.5) (5 mg1.5) (5 mg1.5) (5 mg1.5) (5 mg1) (5 mg1.5) (5 mg1.5)        5mg  on Monday only. All other days, 7.5mg  (1 1/2 tab). All other days of the week. Recheck INR in 3 weeks.

## 2012-07-06 NOTE — Patient Instructions (Addendum)
5mg  on Monday only. All other days, 7.5mg  (1 1/2 tab). All other days of the week. Recheck INR in 3 weeks.     Latest dosing instructions   Total Sun Mon Tue Wed Thu Fri Sat   50 7.5 mg 7.5 mg 7.5 mg 7.5 mg 5 mg 7.5 mg 7.5 mg    (5 mg1.5) (5 mg1.5) (5 mg1.5) (5 mg1.5) (5 mg1) (5 mg1.5) (5 mg1.5)

## 2012-07-29 LAB — POCT INR: INR: 1.6

## 2012-07-30 ENCOUNTER — Telehealth: Payer: Self-pay

## 2012-07-30 ENCOUNTER — Ambulatory Visit: Payer: Self-pay | Admitting: Family

## 2012-07-30 DIAGNOSIS — I4891 Unspecified atrial fibrillation: Secondary | ICD-10-CM

## 2012-07-30 NOTE — Telephone Encounter (Signed)
Spoke with pt. Advised pt, per [facility]'s instructions written on lab report, take 2 tabs today only, then increase coumadin to 7.5mg  qd. Today is pt's 7.5 mg day, therefore instructions clarified with pt to take 2 of the 7.5mg  tabs at 6pm, then continue to take 7.5mg  qd. Pt verbalized understanding. Lab report sent to scanning

## 2012-07-30 NOTE — Patient Instructions (Signed)
Take 2 tabs today only. Then take 7.5mg  (1 1/2 tab) everyday of the week. Recheck INR in 2 weeks.     Latest dosing instructions   Total Sun Mon Tue Wed Thu Fri Sat   52.5 7.5 mg 7.5 mg 7.5 mg 7.5 mg 7.5 mg 7.5 mg 7.5 mg    (5 mg1.5) (5 mg1.5) (5 mg1.5) (5 mg1.5) (5 mg1.5) (5 mg1.5) (5 mg1.5)

## 2012-08-18 ENCOUNTER — Telehealth: Payer: Self-pay | Admitting: *Deleted

## 2012-08-18 NOTE — Telephone Encounter (Signed)
Pt notified medication Celebrex has arrived to the office for him to pick up. Pt verbalized understanding and stated will come next week. Told pt that is fine will be at the front desk for him.

## 2012-08-24 ENCOUNTER — Telehealth: Payer: Self-pay | Admitting: Family

## 2012-08-24 NOTE — Telephone Encounter (Signed)
Spoke with pt. [facility] does his pt check and it was last done on 07/30/12. Pt has been following instructions from then and will have his pt checked again on Wed. He is having brakes put on his truck tomorrow. Advised pt to be sure to have results faxed to our office

## 2012-08-24 NOTE — Telephone Encounter (Signed)
Please call facility and get INR results.

## 2012-08-26 LAB — PROTIME-INR: INR: 2.4 — AB (ref 0.9–1.1)

## 2012-08-27 ENCOUNTER — Ambulatory Visit (INDEPENDENT_AMBULATORY_CARE_PROVIDER_SITE_OTHER): Payer: Self-pay | Admitting: Family

## 2012-08-27 ENCOUNTER — Telehealth: Payer: Self-pay | Admitting: Family

## 2012-08-27 DIAGNOSIS — I4891 Unspecified atrial fibrillation: Secondary | ICD-10-CM

## 2012-08-27 LAB — POCT INR: INR: 2.4

## 2012-08-27 NOTE — Patient Instructions (Signed)
7.5mg  (1 1/2 tab) everyday of the week. Recheck INR in 3 weeks.     Latest dosing instructions   Total Sun Mon Tue Wed Thu Fri Sat   52.5 7.5 mg 7.5 mg 7.5 mg 7.5 mg 7.5 mg 7.5 mg 7.5 mg    (5 mg1.5) (5 mg1.5) (5 mg1.5) (5 mg1.5) (5 mg1.5) (5 mg1.5) (5 mg1.5)

## 2012-08-27 NOTE — Telephone Encounter (Signed)
Left detailed message on personally identified voicemail to advise pt of instructions

## 2012-08-27 NOTE — Telephone Encounter (Signed)
Continue 7.5mg  (1 1/2 tab) everyday of the week. Recheck INR in 3 weeks.

## 2012-09-02 ENCOUNTER — Telehealth: Payer: Self-pay | Admitting: Family

## 2012-09-02 NOTE — Telephone Encounter (Signed)
Error/kjh 

## 2012-09-03 ENCOUNTER — Other Ambulatory Visit (INDEPENDENT_AMBULATORY_CARE_PROVIDER_SITE_OTHER): Payer: Medicare PPO

## 2012-09-03 ENCOUNTER — Ambulatory Visit: Payer: Medicare PPO | Admitting: Family

## 2012-09-03 ENCOUNTER — Telehealth: Payer: Self-pay

## 2012-09-03 ENCOUNTER — Telehealth: Payer: Self-pay | Admitting: Family

## 2012-09-03 DIAGNOSIS — R972 Elevated prostate specific antigen [PSA]: Secondary | ICD-10-CM

## 2012-09-03 DIAGNOSIS — I4891 Unspecified atrial fibrillation: Secondary | ICD-10-CM

## 2012-09-03 DIAGNOSIS — I10 Essential (primary) hypertension: Secondary | ICD-10-CM

## 2012-09-03 DIAGNOSIS — I2510 Atherosclerotic heart disease of native coronary artery without angina pectoris: Secondary | ICD-10-CM

## 2012-09-03 DIAGNOSIS — Z0000 Encounter for general adult medical examination without abnormal findings: Secondary | ICD-10-CM

## 2012-09-03 DIAGNOSIS — E785 Hyperlipidemia, unspecified: Secondary | ICD-10-CM

## 2012-09-03 LAB — BASIC METABOLIC PANEL
Chloride: 104 mEq/L (ref 96–112)
Potassium: 4 mEq/L (ref 3.5–5.1)

## 2012-09-03 LAB — HEPATIC FUNCTION PANEL
ALT: 19 U/L (ref 0–53)
Total Protein: 6.5 g/dL (ref 6.0–8.3)

## 2012-09-03 LAB — POCT URINALYSIS DIPSTICK
Bilirubin, UA: NEGATIVE
Blood, UA: NEGATIVE
Nitrite, UA: NEGATIVE
pH, UA: 7

## 2012-09-03 LAB — CBC WITH DIFFERENTIAL/PLATELET
Basophils Relative: 0 % (ref 0.0–3.0)
Eosinophils Relative: 1.1 % (ref 0.0–5.0)
HCT: 43.9 % (ref 39.0–52.0)
Lymphs Abs: 1.6 10*3/uL (ref 0.7–4.0)
MCV: 94.8 fl (ref 78.0–100.0)
Monocytes Absolute: 0.8 10*3/uL (ref 0.1–1.0)
RBC: 4.63 Mil/uL (ref 4.22–5.81)
WBC: 8.4 10*3/uL (ref 4.5–10.5)

## 2012-09-03 LAB — POCT INR: INR: 1.9

## 2012-09-03 LAB — TSH: TSH: 2.72 u[IU]/mL (ref 0.35–5.50)

## 2012-09-03 LAB — LIPID PANEL: Total CHOL/HDL Ratio: 4

## 2012-09-03 LAB — PSA: PSA: 6.94 ng/mL — ABNORMAL HIGH (ref 0.10–4.00)

## 2012-09-03 NOTE — Telephone Encounter (Signed)
Patient to take and extra 1/2 tablet today only. Continue current dosage. Recheck in 3 weeks.

## 2012-09-03 NOTE — Patient Instructions (Signed)
Take an extra 1/2 tablet today only. Continue 7.5mg  (1 1/2 tab) everyday of the week. Recheck INR in 3 weeks.    Latest dosing instructions   Total Sun Mon Tue Wed Thu Fri Sat   52.5 7.5 mg 7.5 mg 7.5 mg 7.5 mg 7.5 mg 7.5 mg 7.5 mg    (5 mg1.5) (5 mg1.5) (5 mg1.5) (5 mg1.5) (5 mg1.5) (5 mg1.5) (5 mg1.5)

## 2012-09-03 NOTE — Telephone Encounter (Signed)
Left message to advise pt of instructions

## 2012-09-03 NOTE — Telephone Encounter (Signed)
Spoke with pt to advise him of his coumadin dose and to recheck in 3 weeks. Pt states that he will go to [facility] to have it drawn and they will fax results to the office

## 2012-09-08 ENCOUNTER — Encounter: Payer: Self-pay | Admitting: Internal Medicine

## 2012-09-10 ENCOUNTER — Encounter: Payer: Medicare PPO | Admitting: Internal Medicine

## 2012-09-11 ENCOUNTER — Encounter: Payer: Medicare PPO | Admitting: Internal Medicine

## 2012-09-29 ENCOUNTER — Encounter: Payer: Medicare PPO | Admitting: Internal Medicine

## 2012-10-01 LAB — PROTIME-INR: INR: 2.2 — AB (ref 0.9–1.1)

## 2012-10-05 ENCOUNTER — Ambulatory Visit: Payer: Self-pay | Admitting: Family

## 2012-10-05 ENCOUNTER — Telehealth: Payer: Self-pay | Admitting: Family

## 2012-10-05 DIAGNOSIS — I4891 Unspecified atrial fibrillation: Secondary | ICD-10-CM

## 2012-10-05 LAB — POCT INR: INR: 2.2

## 2012-10-05 NOTE — Patient Instructions (Signed)
Continue 7.5mg (1 1/2 tab) everyday of the week. Recheck INR in 4 weeks.     Latest dosing instructions   Total Sun Mon Tue Wed Thu Fri Sat   52.5 7.5 mg 7.5 mg 7.5 mg 7.5 mg 7.5 mg 7.5 mg 7.5 mg    (5 mg1.5) (5 mg1.5) (5 mg1.5) (5 mg1.5) (5 mg1.5) (5 mg1.5) (5 mg1.5)        

## 2012-10-05 NOTE — Telephone Encounter (Signed)
Continue 7.5mg  (1 1/2 tab) everyday of the week. Recheck INR in 4 weeks.     Latest dosing instructions   Total Sun Mon Tue Wed Thu Fri Sat   52.5 7.5 mg 7.5 mg 7.5 mg 7.5 mg 7.5 mg 7.5 mg 7.5 mg    (5 mg1.5) (5 mg1.5) (5 mg1.5) (5 mg1.5) (5 mg1.5) (5 mg1.5) (5 mg1.5)

## 2012-10-05 NOTE — Telephone Encounter (Signed)
Pt aware.

## 2012-10-08 ENCOUNTER — Other Ambulatory Visit: Payer: Self-pay | Admitting: Internal Medicine

## 2012-11-01 ENCOUNTER — Other Ambulatory Visit: Payer: Self-pay | Admitting: Internal Medicine

## 2012-11-02 NOTE — Telephone Encounter (Signed)
Pt needs refill of carbamazepine (TEGRETOL) 200 MG tablet.                              warfarin (COUMADIN) 7.5 MG tablet  Pharm: [facility]/ [address], [place]

## 2012-11-04 ENCOUNTER — Telehealth: Payer: Self-pay | Admitting: Internal Medicine

## 2012-11-04 NOTE — Telephone Encounter (Signed)
Patient Information:  Caller Name: Alexy  Phone: [phone]  Patient: Eric [name], Eric [name]  Gender: Male  DOB: [birth year]  Age: 79 Years  PCP: [name] [facility])  Office Follow Up:  Does the office need to follow up with this patient?: No  Instructions For The Office: N/A   Symptoms  Reason For Call & Symptoms: Caller has questions related to his Coumadin/ warfarin dose.  He denies any bleeding or other current symptoms.  Reviewed EMR and advised 7.5 mg daily ordered as of 10/05/12.  Caller states Rx shows he is to take 5mg  on Monday and Friday.  He states pharmacy is filling it by the old Rx instructions.  He would like  to have new instructions called to pharmacy. Advised to remind office of this when he comes for his next appointment and gets INR results.  He states he had planned to come on 11/05/12, but office will be closed due to inclement weather.  Advised to follow up on 11/06/12.  Caller voiced understanding.  .  Reviewed Health History In EMR: Yes  Reviewed Medications In EMR: Yes  Reviewed Allergies In EMR: Yes  Reviewed Surgeries / Procedures: Yes  Date of Onset of Symptoms: Unknown  Guideline(s) Used:  No Protocol Available - Information Only  Disposition Per Guideline:   Home Care  Reason For Disposition Reached:   Information only question and nurse able to answer  Advice Given:  Call Back If:  New symptoms develop  You become worse.

## 2012-11-10 ENCOUNTER — Telehealth: Payer: Self-pay | Admitting: Family

## 2012-11-10 NOTE — Telephone Encounter (Signed)
Needs an OV for INR

## 2012-11-10 NOTE — Telephone Encounter (Signed)
appt scheduled

## 2012-11-13 ENCOUNTER — Ambulatory Visit (INDEPENDENT_AMBULATORY_CARE_PROVIDER_SITE_OTHER): Payer: Medicare PPO | Admitting: Family

## 2012-11-13 ENCOUNTER — Other Ambulatory Visit: Payer: Self-pay | Admitting: *Deleted

## 2012-11-13 MED ORDER — WARFARIN SODIUM 7.5 MG PO TABS: ORAL_TABLET | ORAL | Status: DC

## 2012-11-13 MED ORDER — ATORVASTATIN CALCIUM 40 MG PO TABS: ORAL_TABLET | ORAL | Status: DC

## 2012-11-13 MED ORDER — CARBAMAZEPINE 200 MG PO TABS: ORAL_TABLET | ORAL | Status: DC

## 2012-11-13 NOTE — Patient Instructions (Addendum)
Continue 7.5mg  (1 1/2 tab) everyday of the week. Recheck INR in 4 weeks.  Anticoagulation Dose Instructions as of 11/13/2012     Glynis Smiles Tue Wed Thu Fri Sat   New Dose 7.5 mg 7.5 mg 7.5 mg 7.5 mg 7.5 mg 7.5 mg 7.5 mg    Description       Continue 7.5mg  (1 1/2 tab) everyday of the week. Recheck INR in 4 weeks.

## 2012-12-07 ENCOUNTER — Ambulatory Visit (INDEPENDENT_AMBULATORY_CARE_PROVIDER_SITE_OTHER): Payer: Medicare PPO | Admitting: Internal Medicine

## 2012-12-07 ENCOUNTER — Encounter: Payer: Self-pay | Admitting: Internal Medicine

## 2012-12-07 VITALS — BP 132/80 | HR 84 | Temp 97.3°F | Resp 20 | Ht 70.75 in | Wt 220.0 lb

## 2012-12-07 DIAGNOSIS — I4891 Unspecified atrial fibrillation: Secondary | ICD-10-CM

## 2012-12-07 DIAGNOSIS — Z0000 Encounter for general adult medical examination without abnormal findings: Secondary | ICD-10-CM

## 2012-12-07 DIAGNOSIS — I10 Essential (primary) hypertension: Secondary | ICD-10-CM

## 2012-12-07 DIAGNOSIS — R972 Elevated prostate specific antigen [PSA]: Secondary | ICD-10-CM

## 2012-12-07 DIAGNOSIS — E785 Hyperlipidemia, unspecified: Secondary | ICD-10-CM

## 2012-12-07 DIAGNOSIS — I2510 Atherosclerotic heart disease of native coronary artery without angina pectoris: Secondary | ICD-10-CM

## 2012-12-07 DIAGNOSIS — K250 Acute gastric ulcer with hemorrhage: Secondary | ICD-10-CM

## 2012-12-07 DIAGNOSIS — M1990 Unspecified osteoarthritis, unspecified site: Secondary | ICD-10-CM

## 2012-12-07 DIAGNOSIS — I509 Heart failure, unspecified: Secondary | ICD-10-CM

## 2012-12-07 DIAGNOSIS — R7302 Impaired glucose tolerance (oral): Secondary | ICD-10-CM

## 2012-12-07 HISTORY — DX: Elevated prostate specific antigen (PSA): R97.20

## 2012-12-07 NOTE — Patient Instructions (Signed)
Limit your sodium (Salt) intake  You need to lose weight.  Consider a lower calorie diet and regular exercise.  Return in 6 months for follow-up   

## 2012-12-07 NOTE — Progress Notes (Signed)
Subjective:    Patient ID: Eric [name], male    DOB: 1931/03/14, 79 y.o.   MRN: [id number]  HPI  79 year old patient who is seen today for a preventive examination. Medical problems include dyslipidemia. He has a history of coronary artery disease and congestive heart failure. He has atrial fibrillation and remains on chronic Coumadin anticoagulation. He has a history of trigeminal neuralgia and remains on low-dose Tegretol. Attempts at discontinuation have resulted in relapses. Today he is doing quite well. Does have remote history of GI bleeding. His cardiac status has been stable.  Past Medical History  Diagnosis Date  . CAD (coronary artery disease)   . GERD (gastroesophageal reflux disease)   . Hyperlipidemia   . Hypertension   . Atrial fibrillation   . Trigeminal neuralgia   . CHF (congestive heart failure)   . NSAID-induced gastric ulcer     History   Social History  . Marital Status: Married    Spouse Name: N/A    Number of Children: N/A  . Years of Education: N/A   Occupational History  . Not on file.   Social History Main Topics  . Smoking status: Former Smoker    Quit date: 09/23/1958  . Smokeless tobacco: Former Neurosurgeon     Comment: quit smoking1980-quit chewing 6 months  . Alcohol Use: No  . Drug Use: No  . Sexually Active: Not on file   Other Topics Concern  . Not on file   Social History Narrative  . No narrative on file    Past Surgical History  Procedure Laterality Date  . Total knee arthroplasty  June 2009    Left  . Shoulder surgery      Left  . Cholecystectomy    . Hip surgery  August 2008    Right total hip replacement  . Esophagogastroduodenoscopy  2011    History reviewed. No pertinent family history.  Allergies  Allergen Reactions  . Enoxaparin Sodium (Enoxaparin Sodium) Anaphylaxis and Other (See Comments)    angioedema  . Codeine Other (See Comments)    hallucinations  . Oxycodone Hcl (Oxycodone Hcl) Other (See Comments)    hallucinations  . Vicodin (Hydrocodone-Acetaminophen) Other (See Comments)    hallucinations  . Sulfonamide Derivatives Rash    Current Outpatient Prescriptions on File Prior to Visit  Medication Sig Dispense Refill  . acetaminophen (TYLENOL) 325 MG tablet Take 650 mg by mouth every 4 (four) hours as needed. pain      . albuterol (PROVENTIL HFA) 108 (90 BASE) MCG/ACT inhaler Inhale 2 puffs into the lungs every 4 (four) hours as needed. For shortness of breath       . Ascorbic Acid (VITAMIN C) 500 MG tablet Take 500 mg by mouth 2 (two) times daily.       Marland Kitchen atorvastatin (LIPITOR) 40 MG tablet TAKE 1 TABLET BY MOUTH EVERY NIGHT AT BEDTIME  90 tablet  4  . benzonatate (TESSALON) 100 MG capsule Take 100 mg by mouth every 8 (eight) hours.       . Bimatoprost (LUMIGAN) 0.01 % SOLN Place 1 drop into both eyes at bedtime. Both eyes at hour of sleep      . Calcium Carbonate-Vitamin D (CALCIUM + D) 600-200 MG-UNIT per tablet Take 1 tablet by mouth daily.       . carbamazepine (TEGRETOL) 200 MG tablet One  tablet twice daily  90 tablet  1  . celecoxib (CELEBREX) 200 MG capsule Take 200 mg by mouth 2 (  two) times daily.      . Difluprednate (DUREZOL) 0.05 % EMUL Place 1 drop into both eyes daily.       Marland Kitchen docusate sodium (COLACE) 100 MG capsule Take 100 mg by mouth 2 (two) times daily.       Marland Kitchen esomeprazole (NEXIUM) 40 MG capsule Take 40 mg by mouth daily.       . fexofenadine (ALLEGRA) 180 MG tablet Take 180 mg by mouth daily.       . multivitamin (THERAGRAN) per tablet Take 1 tablet by mouth daily.       Marland Kitchen warfarin (COUMADIN) 7.5 MG tablet daily  90 tablet  3  . metoprolol succinate (TOPROL XL) 25 MG 24 hr tablet Take 1 tablet (25 mg total) by mouth daily.  30 tablet  5  . nitroGLYCERIN (NITROSTAT) 0.4 MG SL tablet Place 1 tablet (0.4 mg total) under the tongue every 5 (five) minutes x 3 doses as needed for chest pain.  30 tablet  0   No current facility-administered medications on file prior to visit.     BP 132/80  Pulse 84  Temp(Src) 97.3 F (36.3 C) (Oral)  Resp 20  Ht 5' 10.75" (1.797 m)  Wt 220 lb (99.791 kg)  BMI 30.9 kg/m2  SpO2 92%   1. Risk factors, based on past  M,S,F history-  patient has known coronary artery disease. Cardiovascular risk factors include hypertension and dyslipidemia  2.  Physical activities: Fairly sedentary due to age arthritis obesity  3.  Depression/mood: No history depression or mood disorder  4.  Hearing: No deficits  5.  ADL's: Independent in all aspects of daily living  6.  Fall risk: Moderate do to arthritis and gait instability. Uses a cane  7.  Home safety: No problems identified  8.  Height weight, and visual acuity; height and weight stable no change in visual acuity  9.  Counseling: Weight loss and restricted salt diet encouraged  10. Lab orders based on risk factors: Laboratory profile from 2 months ago are reviewed  74. Referral : Not appropriate at this time  12. Care plan: Heart healthy diet weight loss recommended  13. Cognitive assessment: Alert and oriented normal affect. No cognitive dysfunction       Review of Systems     Objective:   Physical Exam  Constitutional: He appears well-developed and well-nourished.  HENT:  Head: Normocephalic and atraumatic.  Right Ear: External ear normal.  Left Ear: External ear normal.  Nose: Nose normal.  Mouth/Throat: Oropharynx is clear and moist.  Eyes: Conjunctivae and EOM are normal. Pupils are equal, round, and reactive to light. No scleral icterus.  Neck: Normal range of motion. Neck supple. No JVD present. No thyromegaly present.  Cardiovascular: Regular rhythm, normal heart sounds and intact distal pulses.  Exam reveals no gallop and no friction rub.   No murmur heard. Pulmonary/Chest: Effort normal and breath sounds normal. He exhibits no tenderness.  Few crackles right base  Abdominal: Soft. Bowel sounds are normal. He exhibits no distension and no mass.  There is no tenderness.  Genitourinary: Prostate normal and penis normal. Guaiac negative stool.  Hemorrhoidal tags  Musculoskeletal: Normal range of motion. He exhibits no edema and no tenderness.  Lymphadenopathy:    He has no cervical adenopathy.  Neurological: He is alert. He has normal reflexes. No cranial nerve deficit. Coordination normal.  Skin: Skin is warm and dry. No rash noted.  Scar right hip  Psychiatric: He has  a normal mood and affect. His behavior is normal.          Assessment & Plan:    Preventive health examination Atrial fibrillation Chronic Coumadin anticoagulation Hypertension CAD Osteoarthritis Remote history of GI bleeding History of trigeminal neuralgia

## 2012-12-11 ENCOUNTER — Encounter: Payer: Medicare PPO | Admitting: Family

## 2012-12-29 ENCOUNTER — Telehealth: Payer: Self-pay | Admitting: Family

## 2012-12-29 NOTE — Telephone Encounter (Signed)
Needs to have INR checked asap. 

## 2012-12-29 NOTE — Telephone Encounter (Signed)
Pt states that he will have it done in [place] and have results faxed to the office tomorrow.

## 2013-01-01 ENCOUNTER — Ambulatory Visit (INDEPENDENT_AMBULATORY_CARE_PROVIDER_SITE_OTHER): Payer: Self-pay | Admitting: Family

## 2013-01-01 ENCOUNTER — Telehealth: Payer: Self-pay | Admitting: Family

## 2013-01-01 DIAGNOSIS — I4891 Unspecified atrial fibrillation: Secondary | ICD-10-CM

## 2013-01-01 NOTE — Telephone Encounter (Signed)
See other note

## 2013-01-01 NOTE — Telephone Encounter (Signed)
Continue 7.5mg  (1 1/2 tab) everyday of the week. Recheck INR in 6 weeks.  Please advise patient

## 2013-01-01 NOTE — Telephone Encounter (Signed)
Pt's wife aware and reminder mailed to pt

## 2013-01-01 NOTE — Patient Instructions (Signed)
Continue 7.5mg  (1 1/2 tab) everyday of the week. Recheck INR in 6 weeks.   Anticoagulation Dose Instructions as of 01/01/2013     Eric [name] Tue Wed Thu Fri Sat   New Dose 7.5 mg 7.5 mg 7.5 mg 7.5 mg 7.5 mg 7.5 mg 7.5 mg    Description       Continue 7.5mg  (1 1/2 tab) everyday of the week. Recheck INR in 6 weeks.

## 2013-01-11 ENCOUNTER — Encounter: Payer: Self-pay | Admitting: Internal Medicine

## 2013-02-11 ENCOUNTER — Telehealth: Payer: Self-pay | Admitting: *Deleted

## 2013-02-11 LAB — PROTIME-INR

## 2013-02-11 LAB — POCT INR: INR: 1.7

## 2013-02-11 NOTE — Telephone Encounter (Signed)
Left message on voicemail to call office.  

## 2013-02-16 NOTE — Telephone Encounter (Signed)
Spoke to pt told him his Celebrex came in. Pt verbalized understanding and said he will come get it when he can. Told pt okay I will hold it for him.

## 2013-02-17 ENCOUNTER — Telehealth: Payer: Self-pay | Admitting: Family

## 2013-02-17 LAB — POCT INR
INR: 1.7
INR: 1.7

## 2013-02-17 NOTE — Telephone Encounter (Signed)
Awaiting fax results

## 2013-02-17 NOTE — Telephone Encounter (Signed)
Needs INR done asap

## 2013-02-17 NOTE — Telephone Encounter (Signed)
Pt calling for PT/INR results. Told pt we have not received them, need to contact lab and have them fax to [place]. Pt verbalized understanding.

## 2013-02-17 NOTE — Telephone Encounter (Signed)
PT/INR waiting for fax.

## 2013-02-17 NOTE — Telephone Encounter (Signed)
Received lab result gave report to [facility] she said she will contact pt.

## 2013-02-18 ENCOUNTER — Ambulatory Visit (INDEPENDENT_AMBULATORY_CARE_PROVIDER_SITE_OTHER): Payer: Self-pay | Admitting: Family

## 2013-02-18 ENCOUNTER — Ambulatory Visit: Payer: Self-pay | Admitting: Family

## 2013-02-18 DIAGNOSIS — I4891 Unspecified atrial fibrillation: Secondary | ICD-10-CM

## 2013-02-18 NOTE — Telephone Encounter (Signed)
Per Oran Rein, take extra 1/2 tab today and tomorrow then continue 7.5mg  qd. Recheck in 3 weeks  Pt aware and verbalized understanding

## 2013-02-18 NOTE — Telephone Encounter (Signed)
Attempted to call pt to no avail. No voicemail. Will try again later

## 2013-03-03 ENCOUNTER — Telehealth: Payer: Self-pay | Admitting: Internal Medicine

## 2013-03-03 NOTE — Telephone Encounter (Signed)
Have him seen by Friday if NOT continuing to improve, o/w agree with advice given.

## 2013-03-03 NOTE — Telephone Encounter (Signed)
Patient Information:  Caller Name: Espen  Phone: [phone]  Patient: Eric [name], Eric [name]  Gender: Male  DOB: 07/25/1931  Age: 79 Years  PCP: [name] [facility])  Office Follow Up:  Does the office need to follow up with this patient?: Yes  Instructions For The Office: PLS READ RN NOTE  RN Note:  Pt has had Diarrhea since 6-6.  Diarrhea is starting to improve but not completely w/ Peptobismol, Pt wanting to know what he can take. Pt is hydrated, urinating normally, denies blood, jet black stook, mucus/pus or adominal pain at time of call.  Per care advice, advised Pt to take Imodium AD OTC, 4mg  first dose and 1 capsule after each loose stool, max dose 18mg , until Diarrhea improves.  Advised Pt to stop Peptobismol while on Imodium and to stop Imodium when diarrhea improves.  Pt verbalized understanding.  PLEASE REVIEW W/ MD IF ADDITIONAL INSTRUCTIONS ARE NEEDED OR IF MD FEELS PT NEEDS TO BE SEEN DUE TO OVER 76 YEARS OLD AND 5TH DAY OF DIARRHEA.  Symptoms  Reason For Call & Symptoms: Diarrhea  Reviewed Health History In EMR: Yes  Reviewed Medications In EMR: Yes  Reviewed Allergies In EMR: Yes  Reviewed Surgeries / Procedures: Yes  Date of Onset of Symptoms: 02/26/2013  Treatments Tried: Pepto Bismol  Treatments Tried Worked: No  Guideline(s) Used:  Diarrhea  Disposition Per Guideline:   Callback by PCP Today  Reason For Disposition Reached:   Age > 70 years  Advice Given:  Reassurance:  In healthy adults, new-onset diarrhea is usually caused by a viral infection of the intestines, which you can treat at home. Diarrhea is the body's way of getting rid of the infection. Here are some tips on how to keep ahead of the fluid losses.  Fluids:  Drink more fluids, at least 8-10 glasses (8 oz or 240 ml) daily.  Nutrition:  Ideal initial foods include boiled starches/cereals (e.g., potatoes, rice, noodles, wheat, oats) with a small amount of salt to taste.  Other  acceptable foods include: bananas, yogurt, crackers, soup.  Diarrhea Medication  - Imodium AD:   Adult dosage: 4 mg (2 capsules or 4 teaspoons or 20 ml) is the recommended first dose. You may take an additional 2 mg (1 capsule or 2 teaspoons or 10 ml) after each loose BM.  Maximum dosage: 16 mg (8 capsules or 16 teaspoons or 80 ml).  Expected Course:  Viral diarrhea lasts 4-7 days. Always worse on days 1 and 2.  Call Back If:  Signs of dehydration occur (e.g., no urine for more than 12 hours, very dry mouth, lightheaded, etc.)  Diarrhea lasts over 7 days  You become worse.  Patient Will Follow Care Advice:  YES

## 2013-03-03 NOTE — Telephone Encounter (Signed)
This is a Dr Kirtland Bouchard pt.  Could you check with Dr Caryl Never to see if it can wait till tomorrow?

## 2013-03-04 ENCOUNTER — Telehealth: Payer: Self-pay | Admitting: Internal Medicine

## 2013-03-04 NOTE — Telephone Encounter (Signed)
Pt would like to know what he can eat and what he should not eat. Pt would like to know when it would be ok (safe) to get his coum checked, since he has had his stomach virus.

## 2013-03-04 NOTE — Telephone Encounter (Signed)
Drink  plenty of  fluids, and advance your diet  slowly to solids as you feel improved.  If you are unable to keep anything down or  Show  signs of dehydration such as dry, cracked lips, or  not urinating, please notify our office.  Okay to check Coumadin on schedule

## 2013-03-04 NOTE — Telephone Encounter (Signed)
Spoke to pt told him to drink plenty of fluids, and advance your diet slowly to solids as you feel improved. If you are unable to keep anything down or Show signs of dehydration such as dry cracked lips, or not urinating, please notify our office and okay to check Coumadin on schedule. Pt verbalized understanding.

## 2013-03-04 NOTE — Telephone Encounter (Signed)
Pt would like to know if we could mail his celecoxib (CELEBREX) 200 MG capsule to his home. Pt states he picks up the CELEBREX at our office.  Why can we not mail to him.  The other nurse would mail.  Pt has to make a special trip to get his med, and he comes from [place] miles the other side of [place], [place].  Pls advise.

## 2013-03-04 NOTE — Telephone Encounter (Signed)
Returned called to patient per Donna's request and informed him of our office policy on mailing medications.  I apologized for the inconvenience and reiterated that we do not mail medications via mail.  Pt also wants nurse to call back in regards to diet he should be eating, states he spoke with nurse earlier who told him to eat bananas and crackers.  However, patient wants to know if he can eat something else since he has not had a diarrhea episode today.

## 2013-03-04 NOTE — Telephone Encounter (Signed)
Talked with pt and he states he has taken the  Imodium and no diarrhea today.  Doesn't feel like he needs to see md, pt states he has been drinking plenty of liquids

## 2013-03-12 ENCOUNTER — Ambulatory Visit (INDEPENDENT_AMBULATORY_CARE_PROVIDER_SITE_OTHER): Payer: Self-pay | Admitting: Family

## 2013-03-12 DIAGNOSIS — I4891 Unspecified atrial fibrillation: Secondary | ICD-10-CM

## 2013-03-12 NOTE — Patient Instructions (Addendum)
Continue 7.5mg  (1 1/2 tab) everyday of the week. Recheck INR in 4 weeks.   Anticoagulation Dose Instructions as of 03/12/2013     Glynis Smiles Tue Wed Thu Fri Sat   New Dose 7.5 mg 7.5 mg 7.5 mg 7.5 mg 7.5 mg 7.5 mg 7.5 mg    Description       Continue 7.5mg  (1 1/2 tab) everyday of the week. Recheck INR in 4 weeks.

## 2013-03-29 ENCOUNTER — Ambulatory Visit (INDEPENDENT_AMBULATORY_CARE_PROVIDER_SITE_OTHER): Payer: Self-pay | Admitting: General Practice

## 2013-03-29 DIAGNOSIS — I4891 Unspecified atrial fibrillation: Secondary | ICD-10-CM

## 2013-05-03 ENCOUNTER — Ambulatory Visit (INDEPENDENT_AMBULATORY_CARE_PROVIDER_SITE_OTHER): Payer: Medicare PPO | Admitting: Family

## 2013-05-03 DIAGNOSIS — I4891 Unspecified atrial fibrillation: Secondary | ICD-10-CM

## 2013-05-03 LAB — PROTIME-INR

## 2013-05-03 LAB — POCT INR: INR: 3.2

## 2013-05-04 ENCOUNTER — Other Ambulatory Visit: Payer: Self-pay | Admitting: Internal Medicine

## 2013-05-05 ENCOUNTER — Telehealth: Payer: Self-pay | Admitting: Internal Medicine

## 2013-05-05 NOTE — Telephone Encounter (Signed)
Pt wants to know if we received the pt/inr results from [facility]. Wanting to know what dosage he should take based on results.

## 2013-05-05 NOTE — Patient Instructions (Signed)
Hold coumadin today only. Continue 7.5mg  (1 1/2 tab) everyday of the week. Recheck INR in 4 weeks.  Patient has INR drawn at [facility].  Re-check in 4 weeks.  Anticoagulation Dose Instructions as of 05/03/2013     Eric [name] Tue Wed Thu Fri Sat   New Dose 7.5 mg 7.5 mg 7.5 mg 7.5 mg 7.5 mg 7.5 mg 7.5 mg    Description       Hold coumadin today only. Continue 7.5mg  (1 1/2 tab) everyday of the week. Recheck INR in 4 weeks.  Patient has INR drawn at [facility].  Re-check in 4 weeks.

## 2013-05-05 NOTE — Telephone Encounter (Signed)
Pt aware and verbalized understanding.  

## 2013-05-05 NOTE — Telephone Encounter (Signed)
Hold coumadin today only. Continue 7.5mg  (1 1/2 tab) everyday of the week. Recheck INR in 4 weeks.  Patient has INR drawn at [facility].  Re-check in 4 weeks.

## 2013-06-01 ENCOUNTER — Encounter: Payer: Self-pay | Admitting: Family Medicine

## 2013-06-01 ENCOUNTER — Emergency Department (HOSPITAL_COMMUNITY): Payer: Medicare PPO

## 2013-06-01 ENCOUNTER — Encounter (HOSPITAL_COMMUNITY): Payer: Self-pay | Admitting: Emergency Medicine

## 2013-06-01 ENCOUNTER — Inpatient Hospital Stay (HOSPITAL_COMMUNITY)
Admission: EM | Admit: 2013-06-01 | Discharge: 2013-06-07 | DRG: 394 | Disposition: A | Discharge status: Home / Self Care | Payer: Medicare PPO | Attending: Internal Medicine | Admitting: Internal Medicine

## 2013-06-01 ENCOUNTER — Ambulatory Visit: Payer: Medicare PPO | Admitting: Family

## 2013-06-01 ENCOUNTER — Ambulatory Visit (INDEPENDENT_AMBULATORY_CARE_PROVIDER_SITE_OTHER): Payer: Medicare PPO | Admitting: Family Medicine

## 2013-06-01 VITALS — BP 98/62 | HR 105 | Temp 98.3°F | Wt 200.0 lb

## 2013-06-01 DIAGNOSIS — Z87891 Personal history of nicotine dependence: Secondary | ICD-10-CM

## 2013-06-01 DIAGNOSIS — R7302 Impaired glucose tolerance (oral): Secondary | ICD-10-CM

## 2013-06-01 DIAGNOSIS — K250 Acute gastric ulcer with hemorrhage: Secondary | ICD-10-CM

## 2013-06-01 DIAGNOSIS — M1990 Unspecified osteoarthritis, unspecified site: Secondary | ICD-10-CM

## 2013-06-01 DIAGNOSIS — M6740 Ganglion, unspecified site: Secondary | ICD-10-CM

## 2013-06-01 DIAGNOSIS — N453 Epididymo-orchitis: Secondary | ICD-10-CM

## 2013-06-01 DIAGNOSIS — R972 Elevated prostate specific antigen [PSA]: Secondary | ICD-10-CM

## 2013-06-01 DIAGNOSIS — K625 Hemorrhage of anus and rectum: Secondary | ICD-10-CM

## 2013-06-01 DIAGNOSIS — R5381 Other malaise: Secondary | ICD-10-CM

## 2013-06-01 DIAGNOSIS — I4891 Unspecified atrial fibrillation: Secondary | ICD-10-CM | POA: Diagnosis present

## 2013-06-01 DIAGNOSIS — Z23 Encounter for immunization: Secondary | ICD-10-CM

## 2013-06-01 DIAGNOSIS — R791 Abnormal coagulation profile: Secondary | ICD-10-CM | POA: Diagnosis present

## 2013-06-01 DIAGNOSIS — Z8679 Personal history of other diseases of the circulatory system: Secondary | ICD-10-CM

## 2013-06-01 DIAGNOSIS — Z79899 Other long term (current) drug therapy: Secondary | ICD-10-CM

## 2013-06-01 DIAGNOSIS — D689 Coagulation defect, unspecified: Secondary | ICD-10-CM

## 2013-06-01 DIAGNOSIS — E860 Dehydration: Secondary | ICD-10-CM | POA: Diagnosis present

## 2013-06-01 DIAGNOSIS — I959 Hypotension, unspecified: Secondary | ICD-10-CM

## 2013-06-01 DIAGNOSIS — Z96649 Presence of unspecified artificial hip joint: Secondary | ICD-10-CM

## 2013-06-01 DIAGNOSIS — K529 Noninfective gastroenteritis and colitis, unspecified: Secondary | ICD-10-CM

## 2013-06-01 DIAGNOSIS — Z96659 Presence of unspecified artificial knee joint: Secondary | ICD-10-CM

## 2013-06-01 DIAGNOSIS — E785 Hyperlipidemia, unspecified: Secondary | ICD-10-CM | POA: Diagnosis present

## 2013-06-01 DIAGNOSIS — K5289 Other specified noninfective gastroenteritis and colitis: Secondary | ICD-10-CM | POA: Diagnosis present

## 2013-06-01 DIAGNOSIS — R079 Chest pain, unspecified: Secondary | ICD-10-CM

## 2013-06-01 DIAGNOSIS — K922 Gastrointestinal hemorrhage, unspecified: Secondary | ICD-10-CM

## 2013-06-01 DIAGNOSIS — K559 Vascular disorder of intestine, unspecified: Principal | ICD-10-CM | POA: Diagnosis present

## 2013-06-01 DIAGNOSIS — Z7901 Long term (current) use of anticoagulants: Secondary | ICD-10-CM

## 2013-06-01 DIAGNOSIS — I482 Chronic atrial fibrillation, unspecified: Secondary | ICD-10-CM | POA: Diagnosis present

## 2013-06-01 DIAGNOSIS — T45515A Adverse effect of anticoagulants, initial encounter: Secondary | ICD-10-CM | POA: Diagnosis present

## 2013-06-01 DIAGNOSIS — K921 Melena: Secondary | ICD-10-CM | POA: Diagnosis present

## 2013-06-01 DIAGNOSIS — I951 Orthostatic hypotension: Secondary | ICD-10-CM

## 2013-06-01 DIAGNOSIS — I2510 Atherosclerotic heart disease of native coronary artery without angina pectoris: Secondary | ICD-10-CM | POA: Diagnosis present

## 2013-06-01 DIAGNOSIS — I509 Heart failure, unspecified: Secondary | ICD-10-CM

## 2013-06-01 DIAGNOSIS — Z8673 Personal history of transient ischemic attack (TIA), and cerebral infarction without residual deficits: Secondary | ICD-10-CM

## 2013-06-01 DIAGNOSIS — G500 Trigeminal neuralgia: Secondary | ICD-10-CM | POA: Diagnosis present

## 2013-06-01 DIAGNOSIS — R531 Weakness: Secondary | ICD-10-CM

## 2013-06-01 DIAGNOSIS — I10 Essential (primary) hypertension: Secondary | ICD-10-CM | POA: Diagnosis present

## 2013-06-01 DIAGNOSIS — K219 Gastro-esophageal reflux disease without esophagitis: Secondary | ICD-10-CM | POA: Diagnosis present

## 2013-06-01 DIAGNOSIS — J069 Acute upper respiratory infection, unspecified: Secondary | ICD-10-CM

## 2013-06-01 HISTORY — DX: Gastrointestinal hemorrhage, unspecified: K92.2

## 2013-06-01 HISTORY — DX: Encounter for other specified aftercare: Z51.89

## 2013-06-01 LAB — POCT INR: INR: 5.2

## 2013-06-01 LAB — LIPASE, BLOOD: Lipase: 11 U/L (ref 11–59)

## 2013-06-01 LAB — COMPREHENSIVE METABOLIC PANEL
Albumin: 3.6 g/dL (ref 3.5–5.2)
BUN: 13 mg/dL (ref 6–23)
Calcium: 8.7 mg/dL (ref 8.4–10.5)
Chloride: 101 mEq/L (ref 96–112)
Creatinine, Ser: 1.06 mg/dL (ref 0.50–1.35)
GFR calc non Af Amer: 65 mL/min — ABNORMAL LOW (ref >90)
Total Bilirubin: 0.4 mg/dL (ref 0.3–1.2)

## 2013-06-01 LAB — CBC WITH DIFFERENTIAL/PLATELET
Eosinophils Absolute: 0 10*3/uL (ref 0.0–0.7)
Lymphocytes Relative: 5 % — ABNORMAL LOW (ref 12–46)
Lymphs Abs: 1.1 10*3/uL (ref 0.7–4.0)
Neutrophils Relative %: 84 % — ABNORMAL HIGH (ref 43–77)
Platelets: 158 10*3/uL (ref 150–400)
RBC: 5.06 MIL/uL (ref 4.22–5.81)
RDW: 14 % (ref 11.5–15.5)
WBC: 21.1 10*3/uL — ABNORMAL HIGH (ref 4.0–10.5)

## 2013-06-01 LAB — OCCULT BLOOD, POC DEVICE: Fecal Occult Bld: POSITIVE — AB

## 2013-06-01 LAB — TYPE AND SCREEN: ABO/RH(D): O POS

## 2013-06-01 MED ORDER — ONDANSETRON HCL 4 MG/2ML IJ SOLN: 4.0000 mg | Freq: Three times a day (TID) | INTRAMUSCULAR | Status: DC | PRN

## 2013-06-01 MED ORDER — METRONIDAZOLE IN NACL 5-0.79 MG/ML-% IV SOLN
500.0000 mg | Freq: Three times a day (TID) | INTRAVENOUS | Status: DC
  Administered 2013-06-01 – 2013-06-07 (×17): 500 mg via INTRAVENOUS
  Filled 2013-06-01 (×20): qty 100

## 2013-06-01 MED ORDER — ACETAMINOPHEN 650 MG RE SUPP: 650.0000 mg | Freq: Four times a day (QID) | RECTAL | Status: DC | PRN

## 2013-06-01 MED ORDER — CIPROFLOXACIN IN D5W 400 MG/200ML IV SOLN: 400.0000 mg | Freq: Once | INTRAVENOUS | Status: DC

## 2013-06-01 MED ORDER — SODIUM CHLORIDE 0.9 % IV SOLN
Freq: Once | INTRAVENOUS | Status: AC
  Administered 2013-06-01: 20:00:00 via INTRAVENOUS

## 2013-06-01 MED ORDER — METRONIDAZOLE IN NACL 5-0.79 MG/ML-% IV SOLN: 500.0000 mg | Freq: Once | INTRAVENOUS | Status: DC

## 2013-06-01 MED ORDER — BENZONATATE 100 MG PO CAPS
100.0000 mg | ORAL_CAPSULE | Freq: Three times a day (TID) | ORAL | Status: DC
  Administered 2013-06-01 – 2013-06-07 (×17): 100 mg via ORAL
  Filled 2013-06-01 (×20): qty 1

## 2013-06-01 MED ORDER — IOHEXOL 300 MG/ML  SOLN
100.0000 mL | Freq: Once | INTRAMUSCULAR | Status: AC | PRN
  Administered 2013-06-01: 100 mL via INTRAVENOUS

## 2013-06-01 MED ORDER — CARBAMAZEPINE 200 MG PO TABS
200.0000 mg | ORAL_TABLET | Freq: Two times a day (BID) | ORAL | Status: DC
  Administered 2013-06-01 – 2013-06-07 (×12): 200 mg via ORAL
  Filled 2013-06-01 (×14): qty 1

## 2013-06-01 MED ORDER — INFLUENZA VAC SPLIT QUAD 0.5 ML IM SUSP
0.5000 mL | INTRAMUSCULAR | Status: AC
  Administered 2013-06-02: 0.5 mL via INTRAMUSCULAR
  Filled 2013-06-01 (×2): qty 0.5

## 2013-06-01 MED ORDER — ONDANSETRON 4 MG PO TBDP
4.0000 mg | ORAL_TABLET | Freq: Once | ORAL | Status: AC
  Administered 2013-06-01: 4 mg via ORAL
  Filled 2013-06-01: qty 1

## 2013-06-01 MED ORDER — ALBUTEROL SULFATE HFA 108 (90 BASE) MCG/ACT IN AERS: 2.0000 | INHALATION_SPRAY | RESPIRATORY_TRACT | Status: DC | PRN

## 2013-06-01 MED ORDER — NITROGLYCERIN 0.4 MG SL SUBL: 0.4000 mg | SUBLINGUAL_TABLET | SUBLINGUAL | Status: DC | PRN

## 2013-06-01 MED ORDER — SODIUM CHLORIDE 0.9 % IV SOLN
INTRAVENOUS | Status: AC
  Administered 2013-06-01: 23:00:00 via INTRAVENOUS

## 2013-06-01 MED ORDER — LORATADINE 10 MG PO TABS
10.0000 mg | ORAL_TABLET | Freq: Every day | ORAL | Status: DC
  Administered 2013-06-02 – 2013-06-07 (×6): 10 mg via ORAL
  Filled 2013-06-01 (×7): qty 1

## 2013-06-01 MED ORDER — SODIUM CHLORIDE 0.9 % IJ SOLN
3.0000 mL | Freq: Two times a day (BID) | INTRAMUSCULAR | Status: DC
  Administered 2013-06-02 – 2013-06-05 (×7): 3 mL via INTRAVENOUS

## 2013-06-01 MED ORDER — DIFLUPREDNATE 0.05 % OP EMUL: 1.0000 [drp] | Freq: Every day | OPHTHALMIC | Status: DC

## 2013-06-01 MED ORDER — CIPROFLOXACIN IN D5W 400 MG/200ML IV SOLN
400.0000 mg | Freq: Two times a day (BID) | INTRAVENOUS | Status: DC
  Administered 2013-06-02 – 2013-06-07 (×12): 400 mg via INTRAVENOUS
  Filled 2013-06-01 (×13): qty 200

## 2013-06-01 MED ORDER — ACETAMINOPHEN 325 MG PO TABS
650.0000 mg | ORAL_TABLET | Freq: Four times a day (QID) | ORAL | Status: DC | PRN
  Administered 2013-06-03 (×2): 650 mg via ORAL
  Filled 2013-06-01 (×2): qty 2

## 2013-06-01 MED ORDER — LATANOPROST 0.005 % OP SOLN
1.0000 [drp] | Freq: Every day | OPHTHALMIC | Status: DC
  Administered 2013-06-01 – 2013-06-06 (×6): 1 [drp] via OPHTHALMIC
  Filled 2013-06-01: qty 2.5

## 2013-06-01 MED ORDER — VITAMIN C 500 MG PO TABS
500.0000 mg | ORAL_TABLET | Freq: Two times a day (BID) | ORAL | Status: DC
  Administered 2013-06-02 – 2013-06-07 (×11): 500 mg via ORAL
  Filled 2013-06-01 (×13): qty 1

## 2013-06-01 MED ORDER — VITAMIN K1 10 MG/ML IJ SOLN
10.0000 mg | Freq: Once | INTRAVENOUS | Status: DC
  Filled 2013-06-01: qty 1

## 2013-06-01 MED ORDER — ONDANSETRON HCL 4 MG/2ML IJ SOLN
4.0000 mg | Freq: Four times a day (QID) | INTRAMUSCULAR | Status: DC | PRN
  Administered 2013-06-02: 08:00:00 4 mg via INTRAVENOUS
  Filled 2013-06-01: qty 2

## 2013-06-01 MED ORDER — ATORVASTATIN CALCIUM 40 MG PO TABS
40.0000 mg | ORAL_TABLET | Freq: Every day | ORAL | Status: DC
  Administered 2013-06-02 – 2013-06-07 (×5): 40 mg via ORAL
  Filled 2013-06-01 (×6): qty 1

## 2013-06-01 MED ORDER — SODIUM CHLORIDE 0.9 % IV SOLN
INTRAVENOUS | Status: AC
  Administered 2013-06-01: 22:00:00 via INTRAVENOUS

## 2013-06-01 MED ORDER — IOHEXOL 300 MG/ML  SOLN
25.0000 mL | INTRAMUSCULAR | Status: DC
  Administered 2013-06-01: 25 mL via ORAL

## 2013-06-01 MED ORDER — MORPHINE SULFATE 4 MG/ML IJ SOLN
4.0000 mg | Freq: Once | INTRAMUSCULAR | Status: AC
  Administered 2013-06-01: 4 mg via INTRAVENOUS
  Filled 2013-06-01 (×2): qty 1

## 2013-06-01 MED ORDER — PREDNISOLONE ACETATE 1 % OP SUSP
1.0000 [drp] | Freq: Every day | OPHTHALMIC | Status: DC
  Administered 2013-06-02 – 2013-06-07 (×6): 1 [drp] via OPHTHALMIC
  Filled 2013-06-01: qty 1

## 2013-06-01 MED ORDER — ONDANSETRON HCL 4 MG PO TABS
4.0000 mg | ORAL_TABLET | Freq: Four times a day (QID) | ORAL | Status: DC | PRN
  Administered 2013-06-02: 4 mg via ORAL
  Filled 2013-06-01: qty 1

## 2013-06-01 MED ORDER — HYDROMORPHONE HCL PF 1 MG/ML IJ SOLN: 0.5000 mg | INTRAMUSCULAR | Status: DC | PRN

## 2013-06-01 MED ORDER — SODIUM CHLORIDE 0.9 % IV BOLUS (SEPSIS)
250.0000 mL | Freq: Once | INTRAVENOUS | Status: AC
  Administered 2013-06-01: 250 mL via INTRAVENOUS

## 2013-06-01 NOTE — ED Notes (Signed)
Pt to ED from  [facility] office with c/o abd pain, nausea and vomiting.  Also st's has had 3 bright red stools today.  EMS gave pt zofran 4mg  ODT with relief of nausea.

## 2013-06-01 NOTE — ED Provider Notes (Signed)
Medical screening examination/treatment/procedure(s) were conducted as a shared visit with non-physician practitioner(s) and myself.  I personally evaluated the patient during the encounter  Pt with tenderness LLQ, normal lung sounds, HR is irregular, normal rate.  Pt with known h/o atrial fibrillation.  Pt with abd pain, N/V/D with noticeable red stools.  Pt thinks about 2 episodes of emesis and 3 episodes of diarrhea in past 24 hours, symptoms began about 3-4 days ago.  Pt has had cholecystectomy in [place] several years ago.  Pt is on coumadin for atrial fib.  Pt seen at [facility], [place], was sent to the ED for further testing and evaluation.  Pt will need CT to r/o diverticulitis, ischemia, obstruction. Will need admission, GI evaluation.  Not hypotensive, likely dehydration.  Hgb is normal.      Impression: N/V/D with dehydration abd pain Rectal bleeding   Gavin Pound. Oletta Lamas, MD 06/01/13 0454

## 2013-06-01 NOTE — H&P (Signed)
[facility] History and Physical  Eric [name] WJX:[id number] DOB: 04/12/1931 DOA: 06/01/2013  Referring physician: ER physician. PCP: Rogelia Boga, MD   Chief Complaint: Abdominal pain with nausea vomiting and diarrhea.  HPI: Eric [name] is a 79 y.o. male with history of atrial fibrillation on Coumadin presents with complaints of abdominal pain. Patient states he has been A. abdominal pain for last 3-4 days. The pain has been in the lower quadrants. Patient also states that he has been having some nausea vomiting and diarrhea. Denies having recently been hospitalized or any use of antibiotics. In the ER patient had CAT scan which shows diffuse colitis. On exam patient also had some blood in the stools. Patient has been admitted for further management. Patient presently is hemodynamically stable.  Review of Systems: As presented in the history of presenting illness, rest negative.  Past Medical History  Diagnosis Date  . CAD (coronary artery disease)   . GERD (gastroesophageal reflux disease)   . Hyperlipidemia   . Hypertension   . Atrial fibrillation   . Trigeminal neuralgia   . CHF (congestive heart failure)   . NSAID-induced gastric ulcer   . Blood transfusion without reported diagnosis   . GI bleed    Past Surgical History  Procedure Laterality Date  . Total knee arthroplasty  June 2009    Left  . Shoulder surgery      Left  . Cholecystectomy    . Hip surgery  August 2008    Right total hip replacement  . Esophagogastroduodenoscopy  2011   Social History:  reports that he quit smoking about 54 years ago. He has quit using smokeless tobacco. He reports that he does not drink alcohol or use illicit drugs. Home. where does patient live-- Can do ADLs. Can patient participate in ADLs?  Allergies  Allergen Reactions  . Enoxaparin Sodium [Enoxaparin Sodium] Anaphylaxis and Other (See Comments)    angioedema  . Codeine Other (See Comments)   hallucinations  . Oxycodone Hcl [Oxycodone Hcl] Other (See Comments)    hallucinations  . Vicodin [Hydrocodone-Acetaminophen] Other (See Comments)    hallucinations  . Sulfonamide Derivatives Rash    History reviewed. No pertinent family history.    Prior to Admission medications   Medication Sig Start Date End Date Taking? Authorizing Provider  acetaminophen (TYLENOL) 325 MG tablet Take 650 mg by mouth every 4 (four) hours as needed. pain   Yes Historical Provider, MD  albuterol (PROVENTIL HFA) 108 (90 BASE) MCG/ACT inhaler Inhale 2 puffs into the lungs every 4 (four) hours as needed. For shortness of breath    Yes Historical Provider, MD  Ascorbic Acid (VITAMIN C) 500 MG tablet Take 500 mg by mouth 2 (two) times daily.    Yes Historical Provider, MD  atorvastatin (LIPITOR) 40 MG tablet TAKE 1 TABLET BY MOUTH EVERY NIGHT AT BEDTIME 11/13/12  Yes [name], MD  benzonatate (TESSALON) 100 MG capsule Take 100 mg by mouth every 8 (eight) hours.    Yes Historical Provider, MD  Bimatoprost (LUMIGAN) 0.01 % SOLN Place 1 drop into both eyes at bedtime. Both eyes at hour of sleep   Yes Historical Provider, MD  Calcium Carbonate-Vitamin D (CALCIUM + D) 600-200 MG-UNIT per tablet Take 1 tablet by mouth daily.    Yes Historical Provider, MD  carbamazepine (TEGRETOL) 200 MG tablet TAKE 1 TABLET BY MOUTH TWICE DAILY 05/04/13  Yes [name], MD  celecoxib (CELEBREX) 200 MG capsule Take 200 mg  by mouth 2 (two) times daily.   Yes Historical Provider, MD  Difluprednate (DUREZOL) 0.05 % EMUL Place 1 drop into both eyes daily.    Yes Historical Provider, MD  docusate sodium (COLACE) 100 MG capsule Take 100 mg by mouth 2 (two) times daily.    Yes Historical Provider, MD  esomeprazole (NEXIUM) 40 MG capsule Take 40 mg by mouth daily.    Yes Historical Provider, MD  fexofenadine (ALLEGRA) 180 MG tablet Take 180 mg by mouth daily.    Yes Historical Provider, MD  multivitamin [facility]) per  tablet Take 1 tablet by mouth daily.    Yes Historical Provider, MD  nitroGLYCERIN (NITROSTAT) 0.4 MG SL tablet Place 0.4 mg under the tongue every 5 (five) minutes as needed for chest pain.   Yes Historical Provider, MD  warfarin (COUMADIN) 7.5 MG tablet daily 11/13/12  Yes Baker Pierini, FNP  nitroGLYCERIN (NITROSTAT) 0.4 MG SL tablet Place 1 tablet (0.4 mg total) under the tongue every 5 (five) minutes x 3 doses as needed for chest pain. 07/28/11 07/27/12  Rhetta Mura, MD   Physical Exam: Filed Vitals:   06/01/13 1839 06/01/13 2000 06/01/13 2100 06/01/13 2149  BP: 134/69 139/76 113/47 116/72  Pulse: 97 106 100 93  Temp:    98.7 F (37.1 C)  TempSrc:    Oral  Resp: 22 22 16 18   Height:    6\' 2"  (1.88 m)  Weight:    89.449 kg (197 lb 3.2 oz)  SpO2: 98% 89% 96% 96%     General:  Well-developed well-nourished.  Eyes: Anicteric no pallor.  ENT: No discharge from ears eyes nose mouth.  Neck: No mass felt.  Cardiovascular: S1-S2 heard.  Respiratory: No rhonchi or crepitations.  Abdomen: Soft mild tenderness in the lower quadrants. No guarding no rigidity.  Skin: No rash.  Musculoskeletal: No edema.  Psychiatric: Appears normal.  Neurologic: Alert awake oriented to time place and person.  Labs on Admission:  Basic Metabolic Panel:  Recent Labs Lab 06/01/13 1723  NA 140  K 4.0  CL 101  CO2 26  GLUCOSE 138*  BUN 13  CREATININE 1.06  CALCIUM 8.7   Liver Function Tests:  Recent Labs Lab 06/01/13 1723  AST 25  ALT 14  ALKPHOS 69  BILITOT 0.4  PROT 7.1  ALBUMIN 3.6    Recent Labs Lab 06/01/13 1723  LIPASE 11   No results found for this basename: AMMONIA,  in the last 168 hours CBC:  Recent Labs Lab 06/01/13 1723  WBC 21.1*  NEUTROABS 17.6*  HGB 16.7  HCT 46.6  MCV 92.1  PLT 158   Cardiac Enzymes: No results found for this basename: CKTOTAL, CKMB, CKMBINDEX, TROPONINI,  in the last 168 hours  BNP (last 3 results) No results  found for this basename: PROBNP,  in the last 8760 hours CBG: No results found for this basename: GLUCAP,  in the last 168 hours  Radiological Exams on Admission: Ct Abdomen Pelvis W Contrast  06/01/2013   CLINICAL DATA:  Left lower quadrant pain and tenderness. Nausea vomiting. Bloody diarrhea.  EXAM: CT ABDOMEN AND PELVIS WITH CONTRAST  TECHNIQUE: Multidetector CT imaging of the abdomen and pelvis was performed using the standard protocol following bolus administration of intravenous contrast.  CONTRAST:  OMNIPAQUE IOHEXOL 300 MG/ML  SOLN  COMPARISON:  08/23/2011  FINDINGS: There is moderate diffuse colonic wall thickening with sparing of terminal ileum and appendix. This is consistent with diffuse colitis, likely  pseudomembranous colitis or other infectious etiology. There is no evidence of abscess, free fluid, or bowel obstruction.  Several hepatic and renal cysts remains stable. No masses or lymphadenopathy identified. Prior cholecystectomy noted. The pancreas, spleen, and adrenal glands are normal in appearance. No evidence of hydronephrosis. Right hip prosthesis results in beam hardening artifact through the inferior pelvis. Moderately enlarged prostate gland again noted, without change. An old L2 vertebral body compression fracture is unchanged.  IMPRESSION: Moderate diffuse colitis, likely due to C difficile or other infectious colitis. No evidence of abscess or other complication.  Stable moderately enlarged prostate and old L2 vertebral body compression fracture.   Electronically Signed   By: Myles Rosenthal   On: 06/01/2013 19:50     Assessment/Plan Principal Problem:   Colitis Active Problems:   Atrial fibrillation   RECTAL BLEEDING   Coagulopathy   1. Diffuse colitis - suspect C. difficile colitis. For now patient has been placed on empirically Cipro and Flagyl. Check stool for C. difficile. Continue with gentle hydration and pain relief medications. 2. Coagulopathy secondary to  Coumadin - if patient has significant fall in hemoglobin then we may have to reverse Coumadin. Closely follow INR and CBC. 3. A. fib presently rate controlled - continue to monitor. 4. History of trigeminal neuralgia - continue carbamazepine.    Code Status: Full code.  Family Communication: None.  Disposition Plan: Admit to inpatient.    Ashlinn Hemrick N. [facility] Pager [phone].  If 7PM-7AM, please contact night-coverage [email or web address] Password [facility] 06/01/2013, 10:39 PM

## 2013-06-01 NOTE — ED Notes (Signed)
Ct made aware that pt is finished drinking contrast

## 2013-06-01 NOTE — Progress Notes (Signed)
Chief Complaint  Patient presents with  . nausea/vomiting fever    blood in stool     HPI:  Feels terrible, melena, BRPBR: -started about 3-4 days ago -symptoms: fevers a few nights ago he thinks, chills, vomited last night - no blood, dark tarry stools for several days that turned too large volume bloody stool today- bright bloody stool and blood in the toilet a few minutes ago, chest has been hurting on and off every time he lies down for last 2 days- not hurting -feels very weak, malaise -on coumadin -feels very sick -denies SOB, palpitations, cough, coffee ground or bilious or bloody emesis, urinary symptoms, heamturia -has not been able to eat for several days, wasn't able to drink anything yesterday - a few sips of water today and 1/2 ginger ail, doesn't want to drink  ROS: See pertinent positives and negatives per HPI.  Past Medical History  Diagnosis Date  . CAD (coronary artery disease)   . GERD (gastroesophageal reflux disease)   . Hyperlipidemia   . Hypertension   . Atrial fibrillation   . Trigeminal neuralgia   . CHF (congestive heart failure)   . NSAID-induced gastric ulcer     Past Surgical History  Procedure Laterality Date  . Total knee arthroplasty  June 2009    Left  . Shoulder surgery      Left  . Cholecystectomy    . Hip surgery  August 2008    Right total hip replacement  . Esophagogastroduodenoscopy  2011    No family history on file.  History   Social History  . Marital Status: Married    Spouse Name: N/A    Number of Children: N/A  . Years of Education: N/A   Social History Main Topics  . Smoking status: Former Smoker    Quit date: 09/23/1958  . Smokeless tobacco: Former Neurosurgeon     Comment: quit smoking1980-quit chewing 6 months  . Alcohol Use: No  . Drug Use: No  . Sexual Activity: None   Other Topics Concern  . None   Social History Narrative  . None    Current outpatient prescriptions:acetaminophen (TYLENOL) 325 MG tablet,  Take 650 mg by mouth every 4 (four) hours as needed. pain, Disp: , Rfl: ;  albuterol (PROVENTIL HFA) 108 (90 BASE) MCG/ACT inhaler, Inhale 2 puffs into the lungs every 4 (four) hours as needed. For shortness of breath , Disp: , Rfl: ;  Ascorbic Acid (VITAMIN C) 500 MG tablet, Take 500 mg by mouth 2 (two) times daily. , Disp: , Rfl:  atorvastatin (LIPITOR) 40 MG tablet, TAKE 1 TABLET BY MOUTH EVERY NIGHT AT BEDTIME, Disp: 90 tablet, Rfl: 4;  benzonatate (TESSALON) 100 MG capsule, Take 100 mg by mouth every 8 (eight) hours. , Disp: , Rfl: ;  Bimatoprost (LUMIGAN) 0.01 % SOLN, Place 1 drop into both eyes at bedtime. Both eyes at hour of sleep, Disp: , Rfl: ;  Calcium Carbonate-Vitamin D (CALCIUM + D) 600-200 MG-UNIT per tablet, Take 1 tablet by mouth daily. , Disp: , Rfl:  carbamazepine (TEGRETOL) 200 MG tablet, TAKE 1 TABLET BY MOUTH TWICE DAILY, Disp: 90 tablet, Rfl: 0;  celecoxib (CELEBREX) 200 MG capsule, Take 200 mg by mouth 2 (two) times daily., Disp: , Rfl: ;  Difluprednate (DUREZOL) 0.05 % EMUL, Place 1 drop into both eyes daily. , Disp: , Rfl: ;  docusate sodium (COLACE) 100 MG capsule, Take 100 mg by mouth 2 (two) times daily. , Disp: ,  Rfl:  esomeprazole (NEXIUM) 40 MG capsule, Take 40 mg by mouth daily. , Disp: , Rfl: ;  fexofenadine (ALLEGRA) 180 MG tablet, Take 180 mg by mouth daily. , Disp: , Rfl: ;  multivitamin (THERAGRAN) per tablet, Take 1 tablet by mouth daily. , Disp: , Rfl: ;  warfarin (COUMADIN) 7.5 MG tablet, daily, Disp: 90 tablet, Rfl: 3;  metoprolol succinate (TOPROL XL) 25 MG 24 hr tablet, Take 1 tablet (25 mg total) by mouth daily., Disp: 30 tablet, Rfl: 5 nitroGLYCERIN (NITROSTAT) 0.4 MG SL tablet, Place 1 tablet (0.4 mg total) under the tongue every 5 (five) minutes x 3 doses as needed for chest pain., Disp: 30 tablet, Rfl: 0  EXAM:  Filed Vitals:   06/01/13 1512  BP: 98/62  Pulse: 105  Temp: 98.3 F (36.8 C)  O2 sats: 98% on RA  Body mass index is 28.09  kg/(m^2).  GENERAL: vitals reviewed and listed above, alert, oriented, appears well hydrated and in no acute distress  HEENT: atraumatic, conjunttiva clear, no obvious abnormalities on inspection of external nose and ears  NECK: no obvious masses on inspection  LUNGS: clear to auscultation bilaterally, no wheezes, rales or rhonchi, good air movement  CV: tachy, no peripheral edema  MS: moves all extremities without noticeable abnormality  RECTAL: BRB on exam glove, hemocult +  PSYCH: pleasant and cooperative, no obvious depression or anxiety  ASSESSMENT AND PLAN:  Discussed the following assessment and plan:  BRBPR (bright red blood per rectum)  Hypotension  Other malaise and fatigue  Weakness  Dehydration  Atrial fibrillation   -Eric [name] is a 79 yo pt of Dr. Amador Cunas on coumadin therapy for A. Fib with CAD, CHF and hx of gastric ulcer feeling very sick and had large amount of blood in toilet here after melanotic stools for several days now with blood on exam glove and feeling very weak and hypotensive with supratherapeutic INR of 5.4 here -son is not comfortable taking him in car to hospital - EMS notified, ED notified -sent to ED for further evaluation -Patient advised to return or notify a doctor immediately if symptoms worsen or persist or new concerns arise.  There are no Patient Instructions on file for this visit.   Kriste Basque R.

## 2013-06-01 NOTE — ED Provider Notes (Signed)
CSN: [id number]     Arrival date & time 06/01/13  1627 History   First MD Initiated Contact with Patient 06/01/13 1628     Chief Complaint  Patient presents with  . Rectal Bleeding   (Consider location/radiation/quality/duration/timing/severity/associated sxs/prior Treatment) HPI Comments: Patient sent from PCP office out of concern for melena,BRBPR, hematochezia, abdominal pain, with subjective fevers.  Pt is on coumadin, INR today is 5.2.  Patient states that 3 days ago he "got hot" while weed eating, had decreased appetite that night, the next morning developed diarrhea 4-5 times daily, one day ago gradually developed abdominal pain with continued diarrhea.  Did not look at his stool until today.  Yesterday noticed orange color on the toilet paper.  Vomited 3 times last night, emesis described as looking like the orange Gatorade he has been drinking.  Pt states he has been hot and sweating, denies chills.  Has not taken temperature at home.  Is feeling lightheaded and generally weak.  Abdominal pain is lower and mid, but also epigastric.  Epigastric pain is described as pressure. Denies CP, cough, SOB, leg swelling, urinary symptoms.  Pt sent from PCP where INR was found to be 5.2.   Past abdominal surgeries: cholecystectomy, umbilical hernia repair, bilateral inguinal hernia repair  Patient is a 79 y.o. male presenting with hematochezia. The history is provided by the patient.  Rectal Bleeding Associated symptoms: abdominal pain, light-headedness and vomiting     Past Medical History  Diagnosis Date  . CAD (coronary artery disease)   . GERD (gastroesophageal reflux disease)   . Hyperlipidemia   . Hypertension   . Atrial fibrillation   . Trigeminal neuralgia   . CHF (congestive heart failure)   . NSAID-induced gastric ulcer   . Blood transfusion without reported diagnosis   . GI bleed    Past Surgical History  Procedure Laterality Date  . Total knee arthroplasty  June 2009    Left    . Shoulder surgery      Left  . Cholecystectomy    . Hip surgery  August 2008    Right total hip replacement  . Esophagogastroduodenoscopy  2011   No family history on file. History  Substance Use Topics  . Smoking status: Former Smoker    Quit date: 09/23/1958  . Smokeless tobacco: Former Neurosurgeon     Comment: quit smoking1980-quit chewing 6 months  . Alcohol Use: No    Review of Systems  Constitutional: Positive for appetite change. Negative for chills.  Respiratory: Negative for cough and shortness of breath.   Cardiovascular: Negative for chest pain and leg swelling.  Gastrointestinal: Positive for nausea, vomiting, abdominal pain, diarrhea, blood in stool and hematochezia.  Genitourinary: Negative for dysuria, urgency and frequency.  Neurological: Positive for weakness (generalized) and light-headedness.  All other systems reviewed and are negative.    Allergies  Enoxaparin sodium; Codeine; Oxycodone hcl; Vicodin; and Sulfonamide derivatives  Home Medications   Current Outpatient Rx  Name  Route  Sig  Dispense  Refill  . acetaminophen (TYLENOL) 325 MG tablet   Oral   Take 650 mg by mouth every 4 (four) hours as needed. pain         . albuterol (PROVENTIL HFA) 108 (90 BASE) MCG/ACT inhaler   Inhalation   Inhale 2 puffs into the lungs every 4 (four) hours as needed. For shortness of breath          . Ascorbic Acid (VITAMIN C) 500 MG tablet  Oral   Take 500 mg by mouth 2 (two) times daily.          Marland Kitchen atorvastatin (LIPITOR) 40 MG tablet      TAKE 1 TABLET BY MOUTH EVERY NIGHT AT BEDTIME   90 tablet   4   . benzonatate (TESSALON) 100 MG capsule   Oral   Take 100 mg by mouth every 8 (eight) hours.          . Bimatoprost (LUMIGAN) 0.01 % SOLN   Both Eyes   Place 1 drop into both eyes at bedtime. Both eyes at hour of sleep         . Calcium Carbonate-Vitamin D (CALCIUM + D) 600-200 MG-UNIT per tablet   Oral   Take 1 tablet by mouth daily.           . carbamazepine (TEGRETOL) 200 MG tablet      TAKE 1 TABLET BY MOUTH TWICE DAILY   90 tablet   0   . celecoxib (CELEBREX) 200 MG capsule   Oral   Take 200 mg by mouth 2 (two) times daily.         . Difluprednate (DUREZOL) 0.05 % EMUL   Both Eyes   Place 1 drop into both eyes daily.          Marland Kitchen docusate sodium (COLACE) 100 MG capsule   Oral   Take 100 mg by mouth 2 (two) times daily.          Marland Kitchen esomeprazole (NEXIUM) 40 MG capsule   Oral   Take 40 mg by mouth daily.          . fexofenadine (ALLEGRA) 180 MG tablet   Oral   Take 180 mg by mouth daily.          Marland Kitchen EXPIRED: metoprolol succinate (TOPROL XL) 25 MG 24 hr tablet   Oral   Take 1 tablet (25 mg total) by mouth daily.   30 tablet   5   . multivitamin (THERAGRAN) per tablet   Oral   Take 1 tablet by mouth daily.          Marland Kitchen EXPIRED: nitroGLYCERIN (NITROSTAT) 0.4 MG SL tablet   Sublingual   Place 1 tablet (0.4 mg total) under the tongue every 5 (five) minutes x 3 doses as needed for chest pain.   30 tablet   0   . warfarin (COUMADIN) 7.5 MG tablet      daily   90 tablet   3    BP 121/69  Pulse 90  Temp(Src) 98.3 F (36.8 C) (Oral)  Resp 21  SpO2 100% Physical Exam  Nursing note and vitals reviewed. Constitutional: He appears well-developed and well-nourished. No distress.  HENT:  Head: Normocephalic and atraumatic.  Neck: Neck supple.  Cardiovascular: Normal rate and regular rhythm.   Pulmonary/Chest: Effort normal and breath sounds normal. No respiratory distress. He has no wheezes. He has no rales.  Abdominal: Soft. He exhibits no distension and no mass. There is tenderness in the epigastric area, left upper quadrant and left lower quadrant. There is no rebound and no guarding.    Genitourinary: Rectal exam shows external hemorrhoid. Guaiac positive stool.  Small amount of gross blood on glove after rectal exam  Musculoskeletal: He exhibits no edema.  Neurological: He is alert. He  exhibits normal muscle tone.  Skin: He is not diaphoretic.  Psychiatric: He has a normal mood and affect. His behavior is normal.    ED  Course  Procedures (including critical care time) Labs Review Labs Reviewed  CBC WITH DIFFERENTIAL - Abnormal; Notable for the following:    WBC 21.1 (*)    Neutrophils Relative % 84 (*)    Neutro Abs 17.6 (*)    Lymphocytes Relative 5 (*)    Monocytes Absolute 2.4 (*)    All other components within normal limits  COMPREHENSIVE METABOLIC PANEL - Abnormal; Notable for the following:    Glucose, Bld 138 (*)    GFR calc non Af Amer 65 (*)    GFR calc Af Amer 75 (*)    All other components within normal limits  CBC - Abnormal; Notable for the following:    WBC 20.7 (*)    Platelets 139 (*)    All other components within normal limits  OCCULT BLOOD, POC DEVICE - Abnormal; Notable for the following:    Fecal Occult Bld POSITIVE (*)    All other components within normal limits  CLOSTRIDIUM DIFFICILE BY PCR  STOOL CULTURE  LIPASE, BLOOD  LACTIC ACID, PLASMA  PROTIME-INR  BASIC METABOLIC PANEL  CBC  TYPE AND SCREEN   Imaging Review Ct Abdomen Pelvis W Contrast  06/01/2013   CLINICAL DATA:  Left lower quadrant pain and tenderness. Nausea vomiting. Bloody diarrhea.  EXAM: CT ABDOMEN AND PELVIS WITH CONTRAST  TECHNIQUE: Multidetector CT imaging of the abdomen and pelvis was performed using the standard protocol following bolus administration of intravenous contrast.  CONTRAST:  OMNIPAQUE IOHEXOL 300 MG/ML  SOLN  COMPARISON:  08/23/2011  FINDINGS: There is moderate diffuse colonic wall thickening with sparing of terminal ileum and appendix. This is consistent with diffuse colitis, likely pseudomembranous colitis or other infectious etiology. There is no evidence of abscess, free fluid, or bowel obstruction.  Several hepatic and renal cysts remains stable. No masses or lymphadenopathy identified. Prior cholecystectomy noted. The pancreas, spleen, and  adrenal glands are normal in appearance. No evidence of hydronephrosis. Right hip prosthesis results in beam hardening artifact through the inferior pelvis. Moderately enlarged prostate gland again noted, without change. An old L2 vertebral body compression fracture is unchanged.  IMPRESSION: Moderate diffuse colitis, likely due to C difficile or other infectious colitis. No evidence of abscess or other complication.  Stable moderately enlarged prostate and old L2 vertebral body compression fracture.   Electronically Signed   By: Myles Rosenthal   On: 06/01/2013 19:50    4:54 PM Patient seen and examined.  Dr Oletta Lamas made aware of patient.  5:44 PM Pt also seen by Dr Oletta Lamas.  INR at office today 5.2.   8:11 PM Discussed all results with patient and family who agree with admission.  Discussed results and plan with Dr Oletta Lamas.  Per our discussion will hold antibiotics and reversal of warfarin for now pending stool culture.  Pt's hgb is 16, BP is stable. No need for urgent reversal.  Given the possibility of c.diff, will wait for sample to be given and defer to hospitalist for further treatment.   MDM   1. Colitis   2. Rectal bleed   3. Supratherapeutic INR   4. Dehydration   5. Coagulopathy    Pt with supratherapeutic INR with N/V/D and abdominal pain with hematochezia, found to have diffuse colitis.  Clinically dehydrated but with hx CHF, gentle fluids started in ED.  Please see discussion above regarding decisions to not reverse coumadin and defer antibiotics to admitting team allowing time for stool sample.  Patient and family informed of all  results and plan, which they agree with.  Admitted to [facility].  I spoke with Dr Toniann Fail.  Holding orders placed by me: step down, team 10.    [place], PA-C 06/02/13 0045

## 2013-06-01 NOTE — ED Notes (Signed)
Pt returned from ct scan.  Pt ambulatory to bathroom.

## 2013-06-01 NOTE — ED Notes (Signed)
Pt became nauseated after drinking contrast.  Zofran 4mg  ODT given for same.

## 2013-06-01 NOTE — ED Notes (Signed)
Report given to 5 w rn.  All hx, current meds, and pt information were given to rn.  Pt a/ox 4, no problems noted.  Pt transported by tech with monitor on pt.

## 2013-06-01 NOTE — ED Notes (Signed)
Eric [name] - [phone] Eric [name] - [phone] Eric [name] - [phone]

## 2013-06-01 NOTE — Progress Notes (Signed)
New Admission Note:   Arrival Method: Via stretcher from ED with EMT and Tech  Mental Orientation: Alert and oriented x4 Telemetry: Placed on Box 09, A. Fib Assessment: Completed  Skin: Warm, dry and intact IV:  Patient has 2 IVs. Fluids started in Right FA Pain: Complains of Abdominal pain.  Tubes: None  Safety Measures: Fall Safety Sheet given  Admission: Completed  5 Chad Orientation: Patient oriented to the room, unit and the staff  Family: No family at bedside. Patient stated that wife would come in the morning   Orders have been reviewed and implemented. Will continue to monitor the patient. Call light has been placed within reach and bed alarm has been activated.   Doristine Devoid, RN  Phone number: [phone]

## 2013-06-02 DIAGNOSIS — I4891 Unspecified atrial fibrillation: Secondary | ICD-10-CM

## 2013-06-02 DIAGNOSIS — K922 Gastrointestinal hemorrhage, unspecified: Secondary | ICD-10-CM

## 2013-06-02 LAB — BASIC METABOLIC PANEL
GFR calc Af Amer: 79 mL/min — ABNORMAL LOW (ref >90)
GFR calc non Af Amer: 69 mL/min — ABNORMAL LOW (ref >90)
Glucose, Bld: 122 mg/dL — ABNORMAL HIGH (ref 70–99)
Potassium: 3.4 mEq/L — ABNORMAL LOW (ref 3.5–5.1)
Sodium: 134 mEq/L — ABNORMAL LOW (ref 135–145)

## 2013-06-02 LAB — CBC
Hemoglobin: 15.6 g/dL (ref 13.0–17.0)
MCH: 32.3 pg (ref 26.0–34.0)
MCV: 90.8 fL (ref 78.0–100.0)
Platelets: 139 10*3/uL — ABNORMAL LOW (ref 150–400)
Platelets: 147 10*3/uL — ABNORMAL LOW (ref 150–400)
RBC: 4.83 MIL/uL (ref 4.22–5.81)
RBC: 5.12 MIL/uL (ref 4.22–5.81)
WBC: 20.7 10*3/uL — ABNORMAL HIGH (ref 4.0–10.5)
WBC: 21.5 10*3/uL — ABNORMAL HIGH (ref 4.0–10.5)

## 2013-06-02 LAB — PROTIME-INR: INR: 5.73 (ref 0.00–1.49)

## 2013-06-02 MED ORDER — METOPROLOL TARTRATE 25 MG PO TABS
25.0000 mg | ORAL_TABLET | Freq: Two times a day (BID) | ORAL | Status: DC
  Administered 2013-06-02 – 2013-06-07 (×10): 25 mg via ORAL
  Filled 2013-06-02 (×13): qty 1

## 2013-06-02 MED ORDER — POLYETHYLENE GLYCOL 3350 17 G PO PACK
17.0000 g | PACK | Freq: Three times a day (TID) | ORAL | Status: DC
  Administered 2013-06-02 – 2013-06-05 (×5): 17 g via ORAL
  Filled 2013-06-02 (×17): qty 1

## 2013-06-02 MED ORDER — PHYTONADIONE 5 MG PO TABS
2.5000 mg | ORAL_TABLET | Freq: Once | ORAL | Status: AC
  Administered 2013-06-02: 2.5 mg via ORAL
  Filled 2013-06-02 (×3): qty 1

## 2013-06-02 NOTE — Care Management Note (Signed)
    Page 1 of 1   06/04/2013     11:57:11 AM   CARE MANAGEMENT NOTE 06/04/2013  Patient:  Eric [name], Eric [name]   Account Number:  [id number]  Date Initiated:  06/02/2013  Documentation initiated by:  Letha Cape  Subjective/Objective Assessment:   dx colitis  admit- lives with spouse.     Action/Plan:   pt eval- rec no pt f/u.   Anticipated DC Date:  06/04/2013   Anticipated DC Plan:  HOME/SELF CARE      DC Planning Services  CM consult      Choice offered to / List presented to:             Status of service:  Completed, signed off Medicare Important Message given?   (If response is "NO", the following Medicare IM given date fields will be blank) Date Medicare IM given:   Date Additional Medicare IM given:    Discharge Disposition:  HOME/SELF CARE  Per UR Regulation:  Reviewed for med. necessity/level of care/duration of stay  If discussed at Long Length of Stay Meetings, dates discussed:    Comments:  06/03/13 11:44 Letha Cape RN ,BSN [phone] per physical therapy patient has no pt needs.  Patient has medication coverage and transportation at dc.  06/02/13 8:02 Letha Cape RN, BSN [phone] patient lives with spouse, NCM will continue to follow for dc needs.

## 2013-06-02 NOTE — Progress Notes (Signed)
CRITICAL VALUE ALERT  Critical value received:  INR 5.73  Date of notification:  7:00 AM  Time of notification:  06/02/2013  Critical value read back:yes  Nurse who received alert:  Theador Hawthorne  MD notified (1st page):  Ghimire  Time of first page:  7:18 AM  MD notified (2nd page):  Time of second page:  Responding MD:  Jerral Ralph  Time MD responded:  7:18 AM

## 2013-06-02 NOTE — Consult Note (Signed)
[facility] CONSULT Reason for consult: G.I. Bleeding Referring Physician: [facility]. PCP: Dr. Kwiatkowski Eric [name] is an 79 y.o. male.  HPI: patient well known to me. He is chronically anticoagulated due to a history of atrial fibrillation and coronary disease. In 2011, he was taken NSAIDs and developed positive stools and anemia. EGD at that time reveal multiple shallow gastric ulcers and colonoscopy was negative other than a few diverticula and internal hemorrhoids. The NSAIDs were stopped and he was treated with PPI therapy. A cause of his anticoagulation we followed up with upper G.I. series about 2 months later in the ulcers were completely healed. Patient assures me that he avoids NSAIDs at this time. He reports that he did yard work on Saturday and felt quite good. He had a normal bowel movement Saturday and Sunday. Sometime during the day on Monday he began to have left-sided abdominal pain and bright red blood per. He is somewhat vague on which of these came 1st. Patient presented to the emergency room with a normal hemoglobin but WBC elevated. INR markedly elevated at 5.7, PT 49. CT of the abdomen revealed diffuse thickening of the colon. The patient has received FFP. He is currently being treated with Cipro for possible infectious colitis as well as Flagyl. Stool tests are negative for C. difficile and enteric pathogens so far with final culture results pending   Past Medical History  Diagnosis Date  . CAD (coronary artery disease)   . GERD (gastroesophageal reflux disease)   . Hyperlipidemia   . Hypertension   . Atrial fibrillation   . Trigeminal neuralgia   . CHF (congestive heart failure)   . NSAID-induced gastric ulcer   . Blood transfusion without reported diagnosis   . GI bleed     Past Surgical History  Procedure Laterality Date  . Total knee arthroplasty  June 2009    Left  . Shoulder surgery      Left  . Cholecystectomy    . Hip surgery   August 2008    Right total hip replacement  . Esophagogastroduodenoscopy  2011    History reviewed. No pertinent family history.  Social History:  reports that he quit smoking about 54 years ago. He has quit using smokeless tobacco. He reports that he does not drink alcohol or use illicit drugs.  Allergies:  Allergies  Allergen Reactions  . Enoxaparin Sodium [Enoxaparin Sodium] Anaphylaxis and Other (See Comments)    angioedema  . Codeine Other (See Comments)    hallucinations  . Oxycodone Hcl [Oxycodone Hcl] Other (See Comments)    hallucinations  . Vicodin [Hydrocodone-Acetaminophen] Other (See Comments)    hallucinations  . Sulfonamide Derivatives Rash    Medications; Prior to Admission medications   Medication Sig Start Date End Date Taking? Authorizing Provider  acetaminophen (TYLENOL) 325 MG tablet Take 650 mg by mouth every 4 (four) hours as needed. pain   Yes Historical Provider, MD  albuterol (PROVENTIL HFA) 108 (90 BASE) MCG/ACT inhaler Inhale 2 puffs into the lungs every 4 (four) hours as needed. For shortness of breath    Yes Historical Provider, MD  Ascorbic Acid (VITAMIN C) 500 MG tablet Take 500 mg by mouth 2 (two) times daily.    Yes Historical Provider, MD  atorvastatin (LIPITOR) 40 MG tablet TAKE 1 TABLET BY MOUTH EVERY NIGHT AT BEDTIME 11/13/12  Yes Peter F Kwiatkowski, MD  benzonatate (TESSALON) 100 MG capsule Take 100 mg by mouth every 8 (eight) hours.      Yes Historical Provider, MD  Bimatoprost (LUMIGAN) 0.01 % SOLN Place 1 drop into both eyes at bedtime. Both eyes at hour of sleep   Yes Historical Provider, MD  Calcium Carbonate-Vitamin D (CALCIUM + D) 600-200 MG-UNIT per tablet Take 1 tablet by mouth daily.    Yes Historical Provider, MD  carbamazepine (TEGRETOL) 200 MG tablet TAKE 1 TABLET BY MOUTH TWICE DAILY 05/04/13  Yes Peter F Kwiatkowski, MD  celecoxib (CELEBREX) 200 MG capsule Take 200 mg by mouth 2 (two) times daily.   Yes Historical Provider, MD   Difluprednate (DUREZOL) 0.05 % EMUL Place 1 drop into both eyes daily.    Yes Historical Provider, MD  docusate sodium (COLACE) 100 MG capsule Take 100 mg by mouth 2 (two) times daily.    Yes Historical Provider, MD  esomeprazole (NEXIUM) 40 MG capsule Take 40 mg by mouth daily.    Yes Historical Provider, MD  fexofenadine (ALLEGRA) 180 MG tablet Take 180 mg by mouth daily.    Yes Historical Provider, MD  multivitamin (THERAGRAN) per tablet Take 1 tablet by mouth daily.    Yes Historical Provider, MD  nitroGLYCERIN (NITROSTAT) 0.4 MG SL tablet Place 0.4 mg under the tongue every 5 (five) minutes as needed for chest pain.   Yes Historical Provider, MD  warfarin (COUMADIN) 7.5 MG tablet daily 11/13/12  Yes Padonda B Campbell, FNP  nitroGLYCERIN (NITROSTAT) 0.4 MG SL tablet Place 1 tablet (0.4 mg total) under the tongue every 5 (five) minutes x 3 doses as needed for chest pain. 07/28/11 07/27/12  Jai-Gurmukh Samtani, MD   . atorvastatin  40 mg Oral q1800  . benzonatate  100 mg Oral Q8H  . carbamazepine  200 mg Oral BID  . ciprofloxacin  400 mg Intravenous BID  . latanoprost  1 drop Both Eyes QHS  . loratadine  10 mg Oral Daily  . metoprolol tartrate  25 mg Oral BID  . metronidazole  500 mg Intravenous Q8H  . phytonadione  2.5 mg Oral Once  . prednisoLONE acetate  1 drop Both Eyes Daily  . sodium chloride  3 mL Intravenous Q12H  . vitamin C  500 mg Oral BID   PRN Meds acetaminophen, acetaminophen, albuterol, nitroGLYCERIN, ondansetron (ZOFRAN) IV, ondansetron Results for orders placed during the hospital encounter of 06/01/13 (from the past 48 hour(s))  CBC WITH DIFFERENTIAL     Status: Abnormal   Collection Time    06/01/13  5:23 PM      Result Value Range   WBC 21.1 (*) 4.0 - 10.5 K/uL   RBC 5.06  4.22 - 5.81 MIL/uL   Hemoglobin 16.7  13.0 - 17.0 g/dL   HCT 46.6  39.0 - 52.0 %   MCV 92.1  78.0 - 100.0 fL   MCH 33.0  26.0 - 34.0 pg   MCHC 35.8  30.0 - 36.0 g/dL   RDW 14.0  11.5 - 15.5  %   Platelets 158  150 - 400 K/uL   Neutrophils Relative % 84 (*) 43 - 77 %   Neutro Abs 17.6 (*) 1.7 - 7.7 K/uL   Lymphocytes Relative 5 (*) 12 - 46 %   Lymphs Abs 1.1  0.7 - 4.0 K/uL   Monocytes Relative 11  3 - 12 %   Monocytes Absolute 2.4 (*) 0.1 - 1.0 K/uL   Eosinophils Relative 0  0 - 5 %   Eosinophils Absolute 0.0  0.0 - 0.7 K/uL   Basophils Relative 0  0 -   1 %   Basophils Absolute 0.0  0.0 - 0.1 K/uL  COMPREHENSIVE METABOLIC PANEL     Status: Abnormal   Collection Time    06/01/13  5:23 PM      Result Value Range   Sodium 140  135 - 145 mEq/L   Potassium 4.0  3.5 - 5.1 mEq/L   Chloride 101  96 - 112 mEq/L   CO2 26  19 - 32 mEq/L   Glucose, Bld 138 (*) 70 - 99 mg/dL   BUN 13  6 - 23 mg/dL   Creatinine, Ser 1.06  0.50 - 1.35 mg/dL   Calcium 8.7  8.4 - 10.5 mg/dL   Total Protein 7.1  6.0 - 8.3 g/dL   Albumin 3.6  3.5 - 5.2 g/dL   AST 25  0 - 37 U/L   ALT 14  0 - 53 U/L   Alkaline Phosphatase 69  39 - 117 U/L   Total Bilirubin 0.4  0.3 - 1.2 mg/dL   GFR calc non Af Amer 65 (*) >90 mL/min   GFR calc Af Amer 75 (*) >90 mL/min   Comment: (NOTE)     The eGFR has been calculated using the CKD EPI equation.     This calculation has not been validated in all clinical situations.     eGFR's persistently <90 mL/min signify possible Chronic Kidney     Disease.  LIPASE, BLOOD     Status: None   Collection Time    06/01/13  5:23 PM      Result Value Range   Lipase 11  11 - 59 U/L  OCCULT BLOOD, POC DEVICE     Status: Abnormal   Collection Time    06/01/13  5:51 PM      Result Value Range   Fecal Occult Bld POSITIVE (*) NEGATIVE  TYPE AND SCREEN     Status: None   Collection Time    06/01/13  7:06 PM      Result Value Range   ABO/RH(D) O POS     Antibody Screen NEG     Sample Expiration 06/04/2013    LACTIC ACID, PLASMA     Status: None   Collection Time    06/01/13  8:51 PM      Result Value Range   Lactic Acid, Venous 1.5  0.5 - 2.2 mmol/L  CLOSTRIDIUM DIFFICILE  BY PCR     Status: None   Collection Time    06/01/13 10:25 PM      Result Value Range   C difficile by pcr NEGATIVE  NEGATIVE  STOOL CULTURE     Status: None   Collection Time    06/01/13 10:25 PM      Result Value Range   Specimen Description STOOL     Special Requests NONE     Culture       Value: Culture reincubated for better growth     Performed at [facility]   Report Status PENDING    CBC     Status: Abnormal   Collection Time    06/01/13 11:35 PM      Result Value Range   WBC 20.7 (*) 4.0 - 10.5 K/uL   RBC 5.12  4.22 - 5.81 MIL/uL   Hemoglobin 16.7  13.0 - 17.0 g/dL   HCT 46.5  39.0 - 52.0 %   MCV 90.8  78.0 - 100.0 fL   MCH 32.6  26.0 - 34.0 pg     MCHC 35.9  30.0 - 36.0 g/dL   RDW 14.2  11.5 - 15.5 %   Platelets 139 (*) 150 - 400 K/uL  PROTIME-INR     Status: Abnormal   Collection Time    06/02/13  5:41 AM      Result Value Range   Prothrombin Time 48.7 (*) 11.6 - 15.2 seconds   INR 5.73 (*) 0.00 - 1.49   Comment: CRITICAL RESULT CALLED TO, READ BACK BY AND VERIFIED WITH:     MOHAMMED A RN 06/02/13 0659 COSTELLO B     REPEATED TO VERIFY  BASIC METABOLIC PANEL     Status: Abnormal   Collection Time    06/02/13  5:41 AM      Result Value Range   Sodium 134 (*) 135 - 145 mEq/L   Potassium 3.4 (*) 3.5 - 5.1 mEq/L   Chloride 99  96 - 112 mEq/L   CO2 22  19 - 32 mEq/L   Glucose, Bld 122 (*) 70 - 99 mg/dL   BUN 15  6 - 23 mg/dL   Creatinine, Ser 1.01  0.50 - 1.35 mg/dL   Calcium 8.2 (*) 8.4 - 10.5 mg/dL   GFR calc non Af Amer 69 (*) >90 mL/min   GFR calc Af Amer 79 (*) >90 mL/min   Comment: (NOTE)     The eGFR has been calculated using the CKD EPI equation.     This calculation has not been validated in all clinical situations.     eGFR's persistently <90 mL/min signify possible Chronic Kidney     Disease.  CBC     Status: Abnormal   Collection Time    06/02/13  5:41 AM      Result Value Range   WBC 21.5 (*) 4.0 - 10.5 K/uL   RBC 4.83  4.22 -  5.81 MIL/uL   Hemoglobin 15.6  13.0 - 17.0 g/dL   HCT 44.4  39.0 - 52.0 %   MCV 91.9  78.0 - 100.0 fL   MCH 32.3  26.0 - 34.0 pg   MCHC 35.1  30.0 - 36.0 g/dL   RDW 14.2  11.5 - 15.5 %   Platelets 147 (*) 150 - 400 K/uL    Ct Abdomen Pelvis W Contrast  06/01/2013   CLINICAL DATA:  Left lower quadrant pain and tenderness. Nausea vomiting. Bloody diarrhea.  EXAM: CT ABDOMEN AND PELVIS WITH CONTRAST  TECHNIQUE: Multidetector CT imaging of the abdomen and pelvis was performed using the standard protocol following bolus administration of intravenous contrast.  CONTRAST:  100mL OMNIPAQUE IOHEXOL 300 MG/ML  SOLN  COMPARISON:  08/23/2011  FINDINGS: There is moderate diffuse colonic wall thickening with sparing of terminal ileum and appendix. This is consistent with diffuse colitis, likely pseudomembranous colitis or other infectious etiology. There is no evidence of abscess, free fluid, or bowel obstruction.  Several hepatic and renal cysts remains stable. No masses or lymphadenopathy identified. Prior cholecystectomy noted. The pancreas, spleen, and adrenal glands are normal in appearance. No evidence of hydronephrosis. Right hip prosthesis results in beam hardening artifact through the inferior pelvis. Moderately enlarged prostate gland again noted, without change. An old L2 vertebral body compression fracture is unchanged.  IMPRESSION: Moderate diffuse colitis, likely due to C difficile or other infectious colitis. No evidence of abscess or other complication.  Stable moderately enlarged prostate and old L2 vertebral body compression fracture.   Electronically Signed   By: John  Stahl   On: 06/01/2013 19:50     ROS: Agree with ROS obtained by Dr Kakrakandy            Blood pressure 109/67, pulse 106, temperature 98.7 F (37.1 C), temperature source Oral, resp. rate 20, height 6' 2" (1.88 m), weight 89.404 kg (197 lb 1.6 oz), SpO2 95.00%.  Physical exam:   General-- pleasant white male in no  acute distress Heart-- regular rate and rhythm without murmurs are gallops  Lungs--clear Abdomen-- bowel sounds present and soft with mild to tenderness more left-sided  Assessment: 1. Acute rectal bleeding 2. Over anticoagulation 3. Diffuse colitis in CT scan with primarily left-sided abdominal pain  this picture is most consistent with ischemic colitis or possibly infectious colitis. Clearly is over anticoagulation is contributing to the bleeding.  Plan: 1. Agree with empiric Cipro Flagyl  2. Agree with correcting INR. 3. We'll keep on clear liquids for now and will go ahead and add Miralax. The patient will likely need colonoscopy and/or sigmoidoscopy once his INR has corrected to evaluate for ischemic colitis   Cherokee Clowers JR,Randal Yepiz L 06/02/2013, 12:22 PM      

## 2013-06-02 NOTE — Consult Note (Signed)
PHARMACY CONSULT NOTE  Pharmacy Consult:  Coumadin Indication: History of atrial fibrillation   Allergies: Allergies  Allergen Reactions  . Enoxaparin Sodium [Enoxaparin Sodium] Anaphylaxis and Other (See Comments)    angioedema  . Codeine Other (See Comments)    hallucinations  . Oxycodone Hcl [Oxycodone Hcl] Other (See Comments)    hallucinations  . Vicodin [Hydrocodone-Acetaminophen] Other (See Comments)    hallucinations  . Sulfonamide Derivatives Rash    Height/Weight: Height: 6\' 2"  (188 cm) Weight: 197 lb 1.6 oz (89.404 kg) IBW/kg (Calculated) : 82.2 Dosing weight 89 kg  Vital Signs: BP 109/67  Pulse 106  Temp(Src) 98.7 F (37.1 C) (Oral)  Resp 20  Ht 6\' 2"  (1.88 m)  Wt 197 lb 1.6 oz (89.404 kg)  BMI 25.3 kg/m2  SpO2 95%  Active Problems: Principal Problem:   Colitis Active Problems:   Atrial fibrillation   RECTAL BLEEDING   Coagulopathy, patient on chronic Coumadin   Labs:  Recent Labs  06/01/13 06/01/13 1723 06/01/13 2335 06/02/13 0541  HGB  --  16.7 16.7 15.6  HCT  --  46.6 46.5 44.4  PLT  --  158 139* 147*  LABPROT  --   --   --  48.7*  INR 5.2  --   --  5.73*  CREATININE  --  1.06  --  1.01   Lab Results  Component Value Date   INR 5.73* 06/02/2013   INR 5.2 06/01/2013   INR 3.2 05/03/2013   Estimated Creatinine Clearance: 69 ml/min (by C-G formula based on Cr of 1.01).  Medical / Surgical History: Past Medical History  Diagnosis Date  . CAD (coronary artery disease)   . GERD (gastroesophageal reflux disease)   . Hyperlipidemia   . Hypertension   . Atrial fibrillation   . Trigeminal neuralgia   . CHF (congestive heart failure)   . NSAID-induced gastric ulcer   . Blood transfusion without reported diagnosis   . GI bleed    Past Surgical History  Procedure Laterality Date  . Total knee arthroplasty  June 2009    Left  . Shoulder surgery      Left  . Cholecystectomy    . Hip surgery  August 2008    Right total hip  replacement  . Esophagogastroduodenoscopy  2011    Medications:  Prescriptions prior to admission  Medication Sig Dispense Refill  . acetaminophen (TYLENOL) 325 MG tablet Take 650 mg by mouth every 4 (four) hours as needed. pain      . albuterol (PROVENTIL HFA) 108 (90 BASE) MCG/ACT inhaler Inhale 2 puffs into the lungs every 4 (four) hours as needed. For shortness of breath       . Ascorbic Acid (VITAMIN C) 500 MG tablet Take 500 mg by mouth 2 (two) times daily.       Marland Kitchen atorvastatin (LIPITOR) 40 MG tablet TAKE 1 TABLET BY MOUTH EVERY NIGHT AT BEDTIME  90 tablet  4  . benzonatate (TESSALON) 100 MG capsule Take 100 mg by mouth every 8 (eight) hours.       . Bimatoprost (LUMIGAN) 0.01 % SOLN Place 1 drop into both eyes at bedtime. Both eyes at hour of sleep      . Calcium Carbonate-Vitamin D (CALCIUM + D) 600-200 MG-UNIT per tablet Take 1 tablet by mouth daily.       . carbamazepine (TEGRETOL) 200 MG tablet TAKE 1 TABLET BY MOUTH TWICE DAILY  90 tablet  0  . celecoxib (CELEBREX) 200  MG capsule Take 200 mg by mouth 2 (two) times daily.      . Difluprednate (DUREZOL) 0.05 % EMUL Place 1 drop into both eyes daily.       Marland Kitchen docusate sodium (COLACE) 100 MG capsule Take 100 mg by mouth 2 (two) times daily.       Marland Kitchen esomeprazole (NEXIUM) 40 MG capsule Take 40 mg by mouth daily.       . fexofenadine (ALLEGRA) 180 MG tablet Take 180 mg by mouth daily.       . multivitamin (THERAGRAN) per tablet Take 1 tablet by mouth daily.       . nitroGLYCERIN (NITROSTAT) 0.4 MG SL tablet Place 0.4 mg under the tongue every 5 (five) minutes as needed for chest pain.      Marland Kitchen warfarin (COUMADIN) 7.5 MG tablet daily  90 tablet  3  . nitroGLYCERIN (NITROSTAT) 0.4 MG SL tablet Place 1 tablet (0.4 mg total) under the tongue every 5 (five) minutes x 3 doses as needed for chest pain.  30 tablet  0   Scheduled:  . atorvastatin  40 mg Oral q1800  . benzonatate  100 mg Oral Q8H  . carbamazepine  200 mg Oral BID  .  ciprofloxacin  400 mg Intravenous BID  . influenza vac split quadrivalent PF  0.5 mL Intramuscular Tomorrow-1000  . latanoprost  1 drop Both Eyes QHS  . loratadine  10 mg Oral Daily  . metronidazole  500 mg Intravenous Q8H  . prednisoLONE acetate  1 drop Both Eyes Daily  . sodium chloride  3 mL Intravenous Q12H  . vitamin C  500 mg Oral BID   Anti-infectives   Start     Dose/Rate Route Frequency Ordered Stop   06/01/13 2330  metroNIDAZOLE (FLAGYL) IVPB 500 mg     500 mg 100 mL/hr over 60 Minutes Intravenous Every 8 hours 06/01/13 2238     06/01/13 2330  ciprofloxacin (CIPRO) IVPB 400 mg     400 mg 200 mL/hr over 60 Minutes Intravenous 2 times daily 06/01/13 2259     06/01/13 2015  ciprofloxacin (CIPRO) IVPB 400 mg  Status:  Discontinued     400 mg 200 mL/hr over 60 Minutes Intravenous  Once 06/01/13 2007 06/01/13 2009   06/01/13 2015  metroNIDAZOLE (FLAGYL) IVPB 500 mg  Status:  Discontinued     500 mg 100 mL/hr over 60 Minutes Intravenous  Once 06/01/13 2007 06/01/13 2009      Assessment:  79 y.o.male admitted for colitis and GIB.  Patient is on chronic Coumadin for atrial fibrillation.  Baseline INR is 5.71.  Home dose of Coumadin is 7.5 mg daily.    Coumadin to be managed by Pharmacy  Goal of Therapy:   INR 2-3      Plan:   Hold Coumadin for now.  Consider Vit K if patient continues with active bleeding. Daily INR's, CBC.  Monitor for bleeding complications.   Royann Wildasin, Elisha Headland,  Pharm.D.. 06/02/2013,  10:43 AM

## 2013-06-02 NOTE — Progress Notes (Signed)
PATIENT DETAILS Name: Eric [name] Age: 79 y.o. Sex: male Date of Birth: 06/29/1931 Admit Date: 06/01/2013 Admitting Physician Eduard Clos, MD [id number] Homero Fellers, MD  Subjective: Still having a lot of diarrhea, vomiting resolved. Some blood in his stools.  Assessment/Plan: Principal Problem:   Colitis - likely infectious  - Essentially unchanged  - Stool C. difficile PCR negative, we'll get stool culture, ova parasites, and a GI pathogen panel  - continue with Cipro and Flagyl- day 2  Lower GI bleed  - In a setting of severe colitis and supratherapeutic INR  - Hemoglobin remained stable  - Will give low-dose vitamin K - as INR still more than 5  - Monitor hemoglobin and hematocrit closely   Supratherapeutic INR - As above - Coumadin on hold - Monitor INR closely  Atrial fibrillation - Rate increases with activity- start metoprolol - Please see above regarding Coumadin  History of trigeminal neuralgia - Continue with Tegretol  Dyslipidemia - Continue with statin  History of CVA - Underlying A. fib, still on Coumadin- see above  History of glaucoma - Stable  Disposition: Remain inpatient  DVT Prophylaxis:  not needed-as supratherapeutic INR   Code Status: Full code   Family Communication  no family at bedside   Procedures:  None  CONSULTS:  GI   MEDICATIONS: Scheduled Meds: . atorvastatin  40 mg Oral q1800  . benzonatate  100 mg Oral Q8H  . carbamazepine  200 mg Oral BID  . ciprofloxacin  400 mg Intravenous BID  . latanoprost  1 drop Both Eyes QHS  . loratadine  10 mg Oral Daily  . metronidazole  500 mg Intravenous Q8H  . prednisoLONE acetate  1 drop Both Eyes Daily  . sodium chloride  3 mL Intravenous Q12H  . vitamin C  500 mg Oral BID   Continuous Infusions: . sodium chloride 50 mL/hr at 06/01/13 2300   PRN Meds:.acetaminophen, acetaminophen, albuterol, nitroGLYCERIN, ondansetron (ZOFRAN) IV,  ondansetron  Antibiotics: Anti-infectives   Start     Dose/Rate Route Frequency Ordered Stop   06/01/13 2330  metroNIDAZOLE (FLAGYL) IVPB 500 mg     500 mg 100 mL/hr over 60 Minutes Intravenous Every 8 hours 06/01/13 2238     06/01/13 2330  ciprofloxacin (CIPRO) IVPB 400 mg     400 mg 200 mL/hr over 60 Minutes Intravenous 2 times daily 06/01/13 2259     06/01/13 2015  ciprofloxacin (CIPRO) IVPB 400 mg  Status:  Discontinued     400 mg 200 mL/hr over 60 Minutes Intravenous  Once 06/01/13 2007 06/01/13 2009   06/01/13 2015  metroNIDAZOLE (FLAGYL) IVPB 500 mg  Status:  Discontinued     500 mg 100 mL/hr over 60 Minutes Intravenous  Once 06/01/13 2007 06/01/13 2009       PHYSICAL EXAM: Vital signs in last 24 hours: Filed Vitals:   06/01/13 2000 06/01/13 2100 06/01/13 2149 06/02/13 0549  BP: 139/76 113/47 116/72 109/67  Pulse: 106 100 93 106  Temp:   98.7 F (37.1 C) 98.7 F (37.1 C)  TempSrc:   Oral Oral  Resp: 22 16 18 20   Height:   6\' 2"  (1.88 m)   Weight:   89.449 kg (197 lb 3.2 oz) 89.404 kg (197 lb 1.6 oz)  SpO2: 89% 96% 96% 95%    Weight change:  Filed Weights   06/01/13 2149 06/02/13 0549  Weight: 89.449 kg (197 lb 3.2 oz) 89.404 kg (197 lb 1.6 oz)   Body mass  index is 25.3 kg/(m^2).   Gen Exam: Awake and alert with clear speech.   Neck: Supple, No JVD.   Chest: B/L Clear.   CVS: S1 S2 Regular, no murmurs.  Abdomen: soft, BS +, mild diffuse tenderness, non distended.  Extremities: no edema, lower extremities warm to touch Neurologic: Non Focal.   Skin: No Rash.   Wounds: N/A.    Intake/Output from previous day:  Intake/Output Summary (Last 24 hours) at 06/02/13 1059 Last data filed at 06/01/13 2300  Gross per 24 hour  Intake    230 ml  Output      0 ml  Net    230 ml     LAB RESULTS: CBC  Recent Labs Lab 06/01/13 1723 06/01/13 2335 06/02/13 0541  WBC 21.1* 20.7* 21.5*  HGB 16.7 16.7 15.6  HCT 46.6 46.5 44.4  PLT 158 139* 147*  MCV 92.1  90.8 91.9  MCH 33.0 32.6 32.3  MCHC 35.8 35.9 35.1  RDW 14.0 14.2 14.2  LYMPHSABS 1.1  --   --   MONOABS 2.4*  --   --   EOSABS 0.0  --   --   BASOSABS 0.0  --   --     Chemistries   Recent Labs Lab 06/01/13 1723 06/02/13 0541  NA 140 134*  K 4.0 3.4*  CL 101 99  CO2 26 22  GLUCOSE 138* 122*  BUN 13 15  CREATININE 1.06 1.01  CALCIUM 8.7 8.2*    CBG: No results found for this basename: GLUCAP,  in the last 168 hours  GFR Estimated Creatinine Clearance: 69 ml/min (by C-G formula based on Cr of 1.01).  Coagulation profile  Recent Labs Lab 06/01/13 06/02/13 0541  INR 5.2 5.73*    Cardiac Enzymes No results found for this basename: CK, CKMB, TROPONINI, MYOGLOBIN,  in the last 168 hours  No components found with this basename: POCBNP,  No results found for this basename: DDIMER,  in the last 72 hours No results found for this basename: HGBA1C,  in the last 72 hours No results found for this basename: CHOL, HDL, LDLCALC, TRIG, CHOLHDL, LDLDIRECT,  in the last 72 hours No results found for this basename: TSH, T4TOTAL, FREET3, T3FREE, THYROIDAB,  in the last 72 hours No results found for this basename: VITAMINB12, FOLATE, FERRITIN, TIBC, IRON, RETICCTPCT,  in the last 72 hours  Recent Labs  06/01/13 1723  LIPASE 11    Urine Studies No results found for this basename: UACOL, UAPR, USPG, UPH, UTP, UGL, UKET, UBIL, UHGB, UNIT, UROB, ULEU, UEPI, UWBC, URBC, UBAC, CAST, CRYS, UCOM, BILUA,  in the last 72 hours  MICROBIOLOGY: Recent Results (from the past 240 hour(s))  CLOSTRIDIUM DIFFICILE BY PCR     Status: None   Collection Time    06/01/13 10:25 PM      Result Value Range Status   C difficile by pcr NEGATIVE  NEGATIVE Final  STOOL CULTURE     Status: None   Collection Time    06/01/13 10:25 PM      Result Value Range Status   Specimen Description STOOL   Final   Special Requests NONE   Final   Culture     Final   Value: Culture reincubated for better  growth     Performed at [facility]   Report Status PENDING   Incomplete    RADIOLOGY STUDIES/RESULTS: Ct Abdomen Pelvis W Contrast  06/01/2013   CLINICAL DATA:  Left lower  quadrant pain and tenderness. Nausea vomiting. Bloody diarrhea.  EXAM: CT ABDOMEN AND PELVIS WITH CONTRAST  TECHNIQUE: Multidetector CT imaging of the abdomen and pelvis was performed using the standard protocol following bolus administration of intravenous contrast.  CONTRAST:  OMNIPAQUE IOHEXOL 300 MG/ML  SOLN  COMPARISON:  08/23/2011  FINDINGS: There is moderate diffuse colonic wall thickening with sparing of terminal ileum and appendix. This is consistent with diffuse colitis, likely pseudomembranous colitis or other infectious etiology. There is no evidence of abscess, free fluid, or bowel obstruction.  Several hepatic and renal cysts remains stable. No masses or lymphadenopathy identified. Prior cholecystectomy noted. The pancreas, spleen, and adrenal glands are normal in appearance. No evidence of hydronephrosis. Right hip prosthesis results in beam hardening artifact through the inferior pelvis. Moderately enlarged prostate gland again noted, without change. An old L2 vertebral body compression fracture is unchanged.  IMPRESSION: Moderate diffuse colitis, likely due to C difficile or other infectious colitis. No evidence of abscess or other complication.  Stable moderately enlarged prostate and old L2 vertebral body compression fracture.   Electronically Signed   By: Myles Rosenthal   On: 06/01/2013 19:50    Jeoffrey Massed, MD  [facility] Pager:[phone]  If 7PM-7AM, please contact night-coverage [email or web address] Password [email or web address] 06/02/2013, 10:59 AM   LOS: 1 day

## 2013-06-02 NOTE — ED Provider Notes (Signed)
Date: 06/02/2013  Rate: 93  Rhythm: atrial fibrillation  QRS Axis: normal  Intervals: normal  ST/T Wave abnormalities: nonspecific ST/T changes  Conduction Disutrbances:incomplete RBBB  Narrative Interpretation:   Old EKG Reviewed: unchanged    Trixie Dredge, PA-C 06/02/13 0106

## 2013-06-03 LAB — BASIC METABOLIC PANEL
Calcium: 8.1 mg/dL — ABNORMAL LOW (ref 8.4–10.5)
Creatinine, Ser: 1.08 mg/dL (ref 0.50–1.35)
GFR calc non Af Amer: 63 mL/min — ABNORMAL LOW (ref >90)
Glucose, Bld: 106 mg/dL — ABNORMAL HIGH (ref 70–99)
Sodium: 136 mEq/L (ref 135–145)

## 2013-06-03 LAB — CBC
Hemoglobin: 13.5 g/dL (ref 13.0–17.0)
MCH: 32.1 pg (ref 26.0–34.0)
MCHC: 35 g/dL (ref 30.0–36.0)
MCV: 91.9 fL (ref 78.0–100.0)
Platelets: 137 10*3/uL — ABNORMAL LOW (ref 150–400)

## 2013-06-03 LAB — GI PATHOGEN PANEL BY PCR, STOOL
Campylobacter by PCR: NEGATIVE
Cryptosporidium by PCR: NEGATIVE
Norovirus GI/GII: NEGATIVE
Rotavirus A by PCR: NEGATIVE
Shigella by PCR: NEGATIVE

## 2013-06-03 LAB — PROTIME-INR: Prothrombin Time: 26 seconds — ABNORMAL HIGH (ref 11.6–15.2)

## 2013-06-03 LAB — OVA AND PARASITE EXAMINATION: Ova and parasites: NONE SEEN

## 2013-06-03 MED ORDER — SODIUM CHLORIDE 0.9 % IV SOLN
INTRAVENOUS | Status: DC
  Administered 2013-06-03: 40 mL/h via INTRAVENOUS
  Administered 2013-06-04: 1000 mL via INTRAVENOUS
  Administered 2013-06-05 – 2013-06-06 (×2): via INTRAVENOUS

## 2013-06-03 MED ORDER — WARFARIN SODIUM 1 MG PO TABS
1.0000 mg | ORAL_TABLET | Freq: Once | ORAL | Status: AC
  Filled 2013-06-03: qty 1

## 2013-06-03 MED ORDER — WARFARIN - PHARMACIST DOSING INPATIENT: Freq: Every day | Status: DC

## 2013-06-03 NOTE — Progress Notes (Signed)
[facility] PROGRESS NOTE  Eric [name]:[id number] DOB: 12-27-1930 DOA: 06/01/2013 PCP: Rogelia Boga, MD  Subjective: Diarrhea has improved since yesterday as well as nausea and vomiting.  Some BRBPR still noticeable in bowel movements.  Assessment/Plan:  Principal Problem:  Colitis  - Likely infectious  - Diarrhea has improved since yesterday occuring less frequently and becoming a little more formed.  - Stool C. difficile PCR negative, Stool culture, ova parasites, and a GI pathogen panel pending  - Continue with Cipro and Flagyl- day 3   Lower GI bleed  - In a setting of severe colitis and supratherapeutic INR  - Hemoglobin remained stable  - One time dose of 2.5 mg Vitamin K given 9/10. INR today is 2.48  - Continue to monitor hemoglobin and hematocrit closely  INR   - Monitor INR closely  - Resume Coumadin-per pharmacy  Atrial fibrillation  - Rate initially not optimal, increased with activity- started metoprolol on 9/10-much better today - Please see above regarding Coumadin   History of trigeminal neuralgia  - Continue with Tegretol   Dyslipidemia  - Continue with statin   History of CVA  - Underlying A. fib, still on Coumadin- see above   History of glaucoma  - Stable   Disposition:  Remain inpatient   DVT Prophylaxis:  not needed- INR is within therapeutic range for prophylatic anticoagulation for Afib.  Code Status:  Full code   Family Communication  No family at bedside -but Dr Jerral Ralph did discuss with son over phone on 9/10  Procedures:  GI has requested possible colonoscopy or sigmoidoscopy one INR is within safe, therapeutic range. See GI Note.  CONSULTS:   GI    Objective: Filed Vitals:   06/03/13 0948  BP: 108/65  Pulse: 92  Temp: 98.3 F (36.8 C)  Resp: 16    Intake/Output Summary (Last 24 hours) at 06/03/13 1016 Last data filed at 06/03/13 0952  Gross per 24 hour  Intake    989 ml  Output      0 ml   Net    989 ml   Filed Weights   06/01/13 2149 06/02/13 0549 06/03/13 0513  Weight: 89.449 kg (197 lb 3.2 oz) 89.404 kg (197 lb 1.6 oz) 89.223 kg (196 lb 11.2 oz)    Exam:   General:  Well appearing male in no acute distress resting comfortable in bed.  Cardiovascular: Irregular heart rate due to known diagnosis of atrial fibrillation.   Respiratory: Non labored, CTAB  Abdomen: Continues to be moderately tender to palpation along the LLQ and somewhat into the RLQ.  Mild discomfort with deep palpation of epigastrium.   Musculoskeletal: Full ROM and strength  Data Reviewed: Basic Metabolic Panel:  Recent Labs Lab 06/01/13 1723 06/02/13 0541 06/03/13 0705  NA 140 134* 136  K 4.0 3.4* 3.4*  CL 101 99 101  CO2 26 22 25   GLUCOSE 138* 122* 106*  BUN 13 15 17   CREATININE 1.06 1.01 1.08  CALCIUM 8.7 8.2* 8.1*   Liver Function Tests:  Recent Labs Lab 06/01/13 1723  AST 25  ALT 14  ALKPHOS 69  BILITOT 0.4  PROT 7.1  ALBUMIN 3.6    Recent Labs Lab 06/01/13 1723  LIPASE 11   CBC:  Recent Labs Lab 06/01/13 1723 06/01/13 2335 06/02/13 0541 06/03/13 0705  WBC 21.1* 20.7* 21.5* 17.5*  NEUTROABS 17.6*  --   --   --   HGB 16.7 16.7 15.6 13.5  HCT 46.6  46.5 44.4 38.6*  MCV 92.1 90.8 91.9 91.9  PLT 158 139* 147* 137*    Recent Results (from the past 240 hour(s))  CLOSTRIDIUM DIFFICILE BY PCR     Status: None   Collection Time    06/01/13 10:25 PM      Result Value Range Status   C difficile by pcr NEGATIVE  NEGATIVE Final  STOOL CULTURE     Status: None   Collection Time    06/01/13 10:25 PM      Result Value Range Status   Specimen Description STOOL   Final   Special Requests NONE   Final   Culture     Final   Value: NO SUSPICIOUS COLONIES, CONTINUING TO HOLD     Performed at [facility]   Report Status PENDING   Incomplete     Studies: Ct Abdomen Pelvis W Contrast  06/01/2013   CLINICAL DATA:  Left lower quadrant pain and tenderness.  Nausea vomiting. Bloody diarrhea.  EXAM: CT ABDOMEN AND PELVIS WITH CONTRAST  TECHNIQUE: Multidetector CT imaging of the abdomen and pelvis was performed using the standard protocol following bolus administration of intravenous contrast.  CONTRAST:  OMNIPAQUE IOHEXOL 300 MG/ML  SOLN  COMPARISON:  08/23/2011  FINDINGS: There is moderate diffuse colonic wall thickening with sparing of terminal ileum and appendix. This is consistent with diffuse colitis, likely pseudomembranous colitis or other infectious etiology. There is no evidence of abscess, free fluid, or bowel obstruction.  Several hepatic and renal cysts remains stable. No masses or lymphadenopathy identified. Prior cholecystectomy noted. The pancreas, spleen, and adrenal glands are normal in appearance. No evidence of hydronephrosis. Right hip prosthesis results in beam hardening artifact through the inferior pelvis. Moderately enlarged prostate gland again noted, without change. An old L2 vertebral body compression fracture is unchanged.  IMPRESSION: Moderate diffuse colitis, likely due to C difficile or other infectious colitis. No evidence of abscess or other complication.  Stable moderately enlarged prostate and old L2 vertebral body compression fracture.   Electronically Signed   By: Myles Rosenthal   On: 06/01/2013 19:50    Scheduled Meds: . atorvastatin  40 mg Oral q1800  . benzonatate  100 mg Oral Q8H  . carbamazepine  200 mg Oral BID  . ciprofloxacin  400 mg Intravenous BID  . latanoprost  1 drop Both Eyes QHS  . loratadine  10 mg Oral Daily  . metoprolol tartrate  25 mg Oral BID  . metronidazole  500 mg Intravenous Q8H  . polyethylene glycol  17 g Oral TID  . prednisoLONE acetate  1 drop Both Eyes Daily  . sodium chloride  3 mL Intravenous Q12H  . vitamin C  500 mg Oral BID   Continuous Infusions:   Principal Problem:   Colitis Active Problems:   Atrial fibrillation   RECTAL BLEEDING   Coagulopathy    Dede Query, Physician Assistant Student  [facility] 06/03/2013, 10:16 AM  LOS: 2 days    Attending -Patient seen and examined,agree with the above assessment and plan. Much better today-vomiting resolved, diarrhea has slowed down, minimal blood with stools. Continue with current management as outlined above.  Windell Norfolk MD

## 2013-06-03 NOTE — Progress Notes (Signed)
[facility] PROGRESS NOTE Subjective Pt feels bettter less diarrhea and bleeding  Objective: Vital signs in last 24 hours: Temp:  [97.9 F (36.6 C)-98.3 F (36.8 C)] 98.3 F (36.8 C) (09/11 0948) Pulse Rate:  [84-108] 92 (09/11 0948) Resp:  [16-20] 16 (09/11 0948) BP: (99-120)/(59-74) 108/65 mmHg (09/11 0948) SpO2:  [94 %-96 %] 94 % (09/11 0948) Weight:  [89.223 kg (196 lb 11.2 oz)] 89.223 kg (196 lb 11.2 oz) (09/11 0513) Last BM Date: 06/03/13  Intake/Output from previous day: 09/10 0701 - 09/11 0700 In: 450 [P.O.:50; IV Piggyback:400] Out: -  Intake/Output this shift: Total I/O In: 739 [P.O.:436; I.V.:3; IV Piggyback:300] Out: -   PE: General--alert  Heart-- Lungs-- Abdomen--soft and nontender  Lab Results:  Recent Labs  06/01/13 1723 06/01/13 2335 06/02/13 0541 06/03/13 0705  WBC 21.1* 20.7* 21.5* 17.5*  HGB 16.7 16.7 15.6 13.5  HCT 46.6 46.5 44.4 38.6*  PLT 158 139* 147* 137*   BMET  Recent Labs  06/01/13 1723 06/02/13 0541 06/03/13 0705  NA 140 134* 136  K 4.0 3.4* 3.4*  CL 101 99 101  CO2 26 22 25   CREATININE 1.06 1.01 1.08   LFT  Recent Labs  06/01/13 1723  PROT 7.1  AST 25  ALT 14  ALKPHOS 69  BILITOT 0.4   PT/INR  Recent Labs  06/01/13 06/02/13 0541 06/03/13 0705  LABPROT  --  48.7* 26.0*  INR 5.2 5.73* 2.48*   PANCREAS  Recent Labs  06/01/13 1723  LIPASE 11         Studies/Results: Ct Abdomen Pelvis W Contrast  06/01/2013   CLINICAL DATA:  Left lower quadrant pain and tenderness. Nausea vomiting. Bloody diarrhea.  EXAM: CT ABDOMEN AND PELVIS WITH CONTRAST  TECHNIQUE: Multidetector CT imaging of the abdomen and pelvis was performed using the standard protocol following bolus administration of intravenous contrast.  CONTRAST:  OMNIPAQUE IOHEXOL 300 MG/ML  SOLN  COMPARISON:  08/23/2011  FINDINGS: There is moderate diffuse colonic wall thickening with sparing of terminal ileum and appendix. This is  consistent with diffuse colitis, likely pseudomembranous colitis or other infectious etiology. There is no evidence of abscess, free fluid, or bowel obstruction.  Several hepatic and renal cysts remains stable. No masses or lymphadenopathy identified. Prior cholecystectomy noted. The pancreas, spleen, and adrenal glands are normal in appearance. No evidence of hydronephrosis. Right hip prosthesis results in beam hardening artifact through the inferior pelvis. Moderately enlarged prostate gland again noted, without change. An old L2 vertebral body compression fracture is unchanged.  IMPRESSION: Moderate diffuse colitis, likely due to C difficile or other infectious colitis. No evidence of abscess or other complication.  Stable moderately enlarged prostate and old L2 vertebral body compression fracture.   Electronically Signed   By: Myles Rosenthal   On: 06/01/2013 19:50    Medications: I have reviewed the patient's current medications.  Assessment/Plan: 1. Diarrhea/AP/Rectal Bleeding. ? Infectious vs ischemic.  Will plan on sigmoidoscopy in AM to try to differentiate. Discussed with Pt.   Eric [name] JR,Tekeyah Santiago L 06/03/2013, 2:27 PM

## 2013-06-03 NOTE — Evaluation (Signed)
Physical Therapy Evaluation Patient Details Name: Eric [name] MRN: [id number] DOB: Nov 24, 1930 Today's Date: 06/03/2013 Time: 1450-1506 PT Time Calculation (min): 16 min  PT Assessment / Plan / Recommendation History of Present Illness  Pt adm with GI bleed and colitis.  Clinical Impression  Pt doing fairly well with mobility.  Legs feels weak with amb.  Should be able to return home from PT standpoint.  Has all needed equipment.     PT Assessment  Patient needs continued PT services    Follow Up Recommendations  No PT follow up    Does the patient have the potential to tolerate intense rehabilitation      Barriers to Discharge        Equipment Recommendations  None recommended by PT    Recommendations for Other Services     Frequency Min 3X/week    Precautions / Restrictions Precautions Precautions: None   Pertinent Vitals/Pain No c/o's.      Mobility  Bed Mobility Bed Mobility: Supine to Sit;Sitting - Scoot to Edge of Bed Supine to Sit: 6: Modified independent (Device/Increase time);HOB elevated Sitting - Scoot to Edge of Bed: 6: Modified independent (Device/Increase time) Transfers Transfers: Sit to Stand;Stand to Sit Sit to Stand: 6: Modified independent (Device/Increase time);With upper extremity assist;From bed;From toilet Stand to Sit: 6: Modified independent (Device/Increase time);With upper extremity assist;To toilet Ambulation/Gait Ambulation/Gait Assistance: 5: Supervision Ambulation Distance (Feet): 200 Feet Assistive device: Rolling walker;None Ambulation/Gait Assistance Details: Pt feels more secure with walker. Gait Pattern: Step-through pattern;Decreased stride length Gait velocity: decr General Gait Details: Incr reliance on walker as distance incr.    Exercises     PT Diagnosis: Generalized weakness  PT Problem List: Decreased strength;Decreased mobility PT Treatment Interventions: DME instruction;Gait training;Patient/family  education;Functional mobility training;Therapeutic activities;Therapeutic exercise     PT Goals(Current goals can be found in the care plan section) Acute Rehab PT Goals Patient Stated Goal: Return home PT Goal Formulation: With patient Time For Goal Achievement: 06/10/13 Potential to Achieve Goals: Good  Visit Information  Last PT Received On: 06/03/13 Assistance Needed: +1 History of Present Illness: Pt adm with GI bleed and colitis.       Prior Functioning  Home Living Family/patient expects to be discharged to:: Private residence Living Arrangements: Spouse/significant other Available Help at Discharge: Family Type of Home: House Home Access: Ramped entrance Home Layout: One level Home Equipment: Environmental consultant - 2 wheels;Wheelchair - manual;Bedside commode Prior Function Level of Independence: Independent Communication Communication: No difficulties    Cognition  Cognition Arousal/Alertness: Awake/alert Behavior During Therapy: WFL for tasks assessed/performed Overall Cognitive Status: Within Functional Limits for tasks assessed    Extremity/Trunk Assessment Upper Extremity Assessment Upper Extremity Assessment: Overall WFL for tasks assessed Lower Extremity Assessment Lower Extremity Assessment: Generalized weakness   Balance Balance Balance Assessed: Yes Static Standing Balance Static Standing - Balance Support: No upper extremity supported;During functional activity Static Standing - Level of Assistance: 6: Modified independent (Device/Increase time)  End of Session PT - End of Session Activity Tolerance: Patient tolerated treatment well Patient left: in bed;with call bell/phone within reach  GP     [facility] 06/03/2013, 3:17 PM  [employer] PT [phone]

## 2013-06-03 NOTE — Progress Notes (Signed)
PHARMACY FOLLOW UP NOTE  Pharmacy Consult for : Coumadin Indication:  atrial fibrillation  Dosing Weight: 89 kg  Labs:  Recent Labs  06/01/13  06/01/13 1723 06/01/13 2335 06/02/13 0541 06/03/13 0705  HGB  --   < > 16.7 16.7 15.6 13.5  HCT  --   < > 46.6 46.5 44.4 38.6*  PLT  --   < > 158 139* 147* 137*  LABPROT  --   --   --   --  48.7* 26.0*  INR 5.2  --   --   --  5.73* 2.48*  CREATININE  --   --  1.06  --  1.01 1.08  < > = values in this interval not displayed. Lab Results  Component Value Date   INR 2.48* 06/03/2013   INR 5.73* 06/02/2013   INR 5.2 06/01/2013    Medications:  Prescriptions prior to admission  Medication Sig Dispense Refill  . acetaminophen (TYLENOL) 325 MG tablet Take 650 mg by mouth every 4 (four) hours as needed. pain      . albuterol (PROVENTIL HFA) 108 (90 BASE) MCG/ACT inhaler Inhale 2 puffs into the lungs every 4 (four) hours as needed. For shortness of breath       . Ascorbic Acid (VITAMIN C) 500 MG tablet Take 500 mg by mouth 2 (two) times daily.       Marland Kitchen atorvastatin (LIPITOR) 40 MG tablet TAKE 1 TABLET BY MOUTH EVERY NIGHT AT BEDTIME  90 tablet  4  . benzonatate (TESSALON) 100 MG capsule Take 100 mg by mouth every 8 (eight) hours.       . Bimatoprost (LUMIGAN) 0.01 % SOLN Place 1 drop into both eyes at bedtime. Both eyes at hour of sleep      . Calcium Carbonate-Vitamin D (CALCIUM + D) 600-200 MG-UNIT per tablet Take 1 tablet by mouth daily.       . carbamazepine (TEGRETOL) 200 MG tablet TAKE 1 TABLET BY MOUTH TWICE DAILY  90 tablet  0  . celecoxib (CELEBREX) 200 MG capsule Take 200 mg by mouth 2 (two) times daily.      . Difluprednate (DUREZOL) 0.05 % EMUL Place 1 drop into both eyes daily.       Marland Kitchen docusate sodium (COLACE) 100 MG capsule Take 100 mg by mouth 2 (two) times daily.       Marland Kitchen esomeprazole (NEXIUM) 40 MG capsule Take 40 mg by mouth daily.       . fexofenadine (ALLEGRA) 180 MG tablet Take 180 mg by mouth daily.       . multivitamin  (THERAGRAN) per tablet Take 1 tablet by mouth daily.       . nitroGLYCERIN (NITROSTAT) 0.4 MG SL tablet Place 0.4 mg under the tongue every 5 (five) minutes as needed for chest pain.      Marland Kitchen warfarin (COUMADIN) 7.5 MG tablet daily  90 tablet  3  . nitroGLYCERIN (NITROSTAT) 0.4 MG SL tablet Place 1 tablet (0.4 mg total) under the tongue every 5 (five) minutes x 3 doses as needed for chest pain.  30 tablet  0   Scheduled:  . atorvastatin  40 mg Oral q1800  . benzonatate  100 mg Oral Q8H  . carbamazepine  200 mg Oral BID  . ciprofloxacin  400 mg Intravenous BID  . latanoprost  1 drop Both Eyes QHS  . loratadine  10 mg Oral Daily  . metoprolol tartrate  25 mg Oral BID  . metronidazole  500 mg Intravenous Q8H  . polyethylene glycol  17 g Oral TID  . prednisoLONE acetate  1 drop Both Eyes Daily  . sodium chloride  3 mL Intravenous Q12H  . vitamin C  500 mg Oral BID   Anti-infectives   Start     Dose/Rate Route Frequency Ordered Stop   06/01/13 2330  metroNIDAZOLE (FLAGYL) IVPB 500 mg     500 mg 100 mL/hr over 60 Minutes Intravenous Every 8 hours 06/01/13 2238     06/01/13 2330  ciprofloxacin (CIPRO) IVPB 400 mg     400 mg 200 mL/hr over 60 Minutes Intravenous 2 times daily 06/01/13 2259     06/01/13 2015  ciprofloxacin (CIPRO) IVPB 400 mg  Status:  Discontinued     400 mg 200 mL/hr over 60 Minutes Intravenous  Once 06/01/13 2007 06/01/13 2009   06/01/13 2015  metroNIDAZOLE (FLAGYL) IVPB 500 mg  Status:  Discontinued     500 mg 100 mL/hr over 60 Minutes Intravenous  Once 06/01/13 2007 06/01/13 2009      Assessment:  79 y.o.male admitted for colitis and GIB. Patient is on chronic Coumadin for atrial fibrillation. Admission Baseline INR was 5.71.  INR today 2.48 following Vit K 2.5 mg x 1.  Hgb 15.6 > 13.5 gm/dl.  Patient has been started on Cipro and Flagyl, both potentiators of Coumadin response.  Goal of Therapy:  INR 2-3   Plan:  Careful restart of Coumadin today.  Will  give Coumadin 1 mg x 1.  Daily INR's, CBC.  Monitor for further bleeding complications.    Yakub Lodes, Deetta Perla.D 06/03/2013, 11:21 AM

## 2013-06-04 ENCOUNTER — Encounter (HOSPITAL_COMMUNITY)
Admission: EM | Disposition: A | Discharge status: Home / Self Care | Payer: Self-pay | Source: Home / Self Care | Attending: Internal Medicine

## 2013-06-04 ENCOUNTER — Encounter (HOSPITAL_COMMUNITY): Payer: Self-pay | Admitting: Gastroenterology

## 2013-06-04 HISTORY — PX: FLEXIBLE SIGMOIDOSCOPY: SHX5431

## 2013-06-04 LAB — PROTIME-INR
INR: 1.68 — ABNORMAL HIGH (ref 0.00–1.49)
Prothrombin Time: 19.3 seconds — ABNORMAL HIGH (ref 11.6–15.2)

## 2013-06-04 LAB — CBC
MCH: 31.8 pg (ref 26.0–34.0)
MCV: 89.8 fL (ref 78.0–100.0)
Platelets: 157 10*3/uL (ref 150–400)
RBC: 4.31 MIL/uL (ref 4.22–5.81)
RDW: 13.7 % (ref 11.5–15.5)

## 2013-06-04 LAB — BASIC METABOLIC PANEL
CO2: 24 mEq/L (ref 19–32)
Calcium: 8.5 mg/dL (ref 8.4–10.5)
Creatinine, Ser: 0.92 mg/dL (ref 0.50–1.35)
GFR calc Af Amer: >90 mL/min (ref >90)
GFR calc non Af Amer: 78 mL/min — ABNORMAL LOW (ref >90)
Sodium: 135 mEq/L (ref 135–145)

## 2013-06-04 SURGERY — SIGMOIDOSCOPY, FLEXIBLE
Anesthesia: Moderate Sedation

## 2013-06-04 SURGERY — EGD (ESOPHAGOGASTRODUODENOSCOPY)
Anesthesia: Moderate Sedation

## 2013-06-04 MED ORDER — FENTANYL CITRATE 0.05 MG/ML IJ SOLN
INTRAMUSCULAR | Status: AC
  Filled 2013-06-04: qty 2

## 2013-06-04 MED ORDER — SODIUM CHLORIDE 0.9 % IV SOLN: INTRAVENOUS | Status: DC

## 2013-06-04 MED ORDER — MIDAZOLAM HCL 10 MG/2ML IJ SOLN
INTRAMUSCULAR | Status: DC | PRN
  Administered 2013-06-04: 2 mg via INTRAVENOUS
  Administered 2013-06-04: 1 mg via INTRAVENOUS

## 2013-06-04 MED ORDER — POTASSIUM CHLORIDE 10 MEQ/100ML IV SOLN
10.0000 meq | INTRAVENOUS | Status: AC
  Administered 2013-06-04 (×2): 10 meq via INTRAVENOUS
  Filled 2013-06-04 (×2): qty 100

## 2013-06-04 MED ORDER — MIDAZOLAM HCL 5 MG/ML IJ SOLN
INTRAMUSCULAR | Status: AC
  Filled 2013-06-04: qty 1

## 2013-06-04 MED ORDER — WARFARIN SODIUM 5 MG PO TABS
5.0000 mg | ORAL_TABLET | Freq: Once | ORAL | Status: AC
  Administered 2013-06-04: 5 mg via ORAL
  Filled 2013-06-04: qty 1

## 2013-06-04 MED ORDER — FENTANYL CITRATE 0.05 MG/ML IJ SOLN
INTRAMUSCULAR | Status: DC | PRN
  Administered 2013-06-04 (×2): 25 ug via INTRAVENOUS

## 2013-06-04 MED ORDER — POTASSIUM CHLORIDE 10 MEQ/100ML IV SOLN
10.0000 meq | INTRAVENOUS | Status: AC
  Filled 2013-06-04 (×2): qty 100

## 2013-06-04 NOTE — Interval H&P Note (Signed)
History and Physical Interval Note:  06/04/2013 8:30 AM  Eric [name]  has presented today for surgery, with the diagnosis of rectal bleeding  The various methods of treatment have been discussed with the patient and family. After consideration of risks, benefits and other options for treatment, the patient has consented to  Procedure(s): FLEXIBLE SIGMOIDOSCOPY (N/A) as a surgical intervention .  The patient's history has been reviewed, patient examined, no change in status, stable for surgery.  I have reviewed the patient's chart and labs.  Questions were answered to the patient's satisfaction.     Floreine Kingdon JR,Chaston Bradburn L

## 2013-06-04 NOTE — Progress Notes (Signed)
PHARMACY FOLLOW UP NOTE  Pharmacy Consult for : Coumadin Indication:  atrial fibrillation  Dosing Weight: 89 kg  Labs:  Recent Labs  06/02/13 0541 06/03/13 0705 06/04/13 0610  HGB 15.6 13.5 13.7  HCT 44.4 38.6* 38.7*  PLT 147* 137* 157  LABPROT 48.7* 26.0* 19.3*  INR 5.73* 2.48* 1.68*  CREATININE 1.01 1.08 0.92   Lab Results  Component Value Date   INR 1.68* 06/04/2013   INR 2.48* 06/03/2013   INR 5.73* 06/02/2013    Medications:  Scheduled:  . atorvastatin  40 mg Oral q1800  . benzonatate  100 mg Oral Q8H  . carbamazepine  200 mg Oral BID  . ciprofloxacin  400 mg Intravenous BID  . latanoprost  1 drop Both Eyes QHS  . loratadine  10 mg Oral Daily  . metoprolol tartrate  25 mg Oral BID  . metronidazole  500 mg Intravenous Q8H  . polyethylene glycol  17 g Oral TID  . prednisoLONE acetate  1 drop Both Eyes Daily  . sodium chloride  3 mL Intravenous Q12H  . vitamin C  500 mg Oral BID  . warfarin  1 mg Oral ONCE-1800  . Warfarin - Pharmacist Dosing Inpatient   Does not apply q1800   Anti-infectives   Start     Dose/Rate Route Frequency Ordered Stop   06/01/13 2330  metroNIDAZOLE (FLAGYL) IVPB 500 mg     500 mg 100 mL/hr over 60 Minutes Intravenous Every 8 hours 06/01/13 2238     06/01/13 2330  ciprofloxacin (CIPRO) IVPB 400 mg     400 mg 200 mL/hr over 60 Minutes Intravenous 2 times daily 06/01/13 2259     06/01/13 2015  ciprofloxacin (CIPRO) IVPB 400 mg  Status:  Discontinued     400 mg 200 mL/hr over 60 Minutes Intravenous  Once 06/01/13 2007 06/01/13 2009   06/01/13 2015  metroNIDAZOLE (FLAGYL) IVPB 500 mg  Status:  Discontinued     500 mg 100 mL/hr over 60 Minutes Intravenous  Once 06/01/13 2007 06/01/13 2009      Assessment:  79 y.o.male admitted for colitis and GIB.  His baseline INR was SUPRAtheraeutic and INR has continued to drop to 1.68.    Coumadin was ordered for 06/03/13 but was not charted as being given.  Hgb stable with no further frank  bleeding complications noted   Goal of Therapy:  INR 2-3   Plan:  Coumadin 5 mg today.  Daily INR's, CBC.  Monitor for bleeding complications.    Eric [name], Eric [name].D 06/04/2013, 10:42 AM

## 2013-06-04 NOTE — H&P (View-Only) (Signed)
[facility] CON[facility] Reason for consult: G.I. Bleeding Referring Physician: [facility]. PCP: Dr. Nunzio Cobbs is an 79 y.o. male.  HPI: patient well known to me. He is chronically anticoagulated due to a history of atrial fibrillation and coronary disease. In 2011, he was taken NSAIDs and developed positive stools and anemia. EGD at that time reveal multiple shallow gastric ulcers and colonoscopy was negative other than a few diverticula and internal hemorrhoids. The NSAIDs were stopped and he was treated with PPI therapy. A cause of his anticoagulation we followed up with upper G.I. series about 2 months later in the ulcers were completely healed. Patient assures me that he avoids NSAIDs at this time. He reports that he did yard work on Saturday and felt quite good. He had a normal bowel movement Saturday and Sunday. Sometime during the day on Monday he began to have left-sided abdominal pain and bright red blood per. He is somewhat vague on which of these came 1st. Patient presented to the emergency room with a normal hemoglobin but WBC elevated. INR markedly elevated at 5.7, PT 49. CT of the abdomen revealed diffuse thickening of the colon. The patient has received FFP. He is currently being treated with Cipro for possible infectious colitis as well as Flagyl. Stool tests are negative for C. difficile and enteric pathogens so far with final culture results pending   Past Medical History  Diagnosis Date  . CAD (coronary artery disease)   . GERD (gastroesophageal reflux disease)   . Hyperlipidemia   . Hypertension   . Atrial fibrillation   . Trigeminal neuralgia   . CHF (congestive heart failure)   . NSAID-induced gastric ulcer   . Blood transfusion without reported diagnosis   . GI bleed     Past Surgical History  Procedure Laterality Date  . Total knee arthroplasty  June 2009    Left  . Shoulder surgery      Left  . Cholecystectomy    . Hip surgery   August 2008    Right total hip replacement  . Esophagogastroduodenoscopy  2011    History reviewed. No pertinent family history.  Social History:  reports that he quit smoking about 54 years ago. He has quit using smokeless tobacco. He reports that he does not drink alcohol or use illicit drugs.  Allergies:  Allergies  Allergen Reactions  . Enoxaparin Sodium [Enoxaparin Sodium] Anaphylaxis and Other (See Comments)    angioedema  . Codeine Other (See Comments)    hallucinations  . Oxycodone Hcl [Oxycodone Hcl] Other (See Comments)    hallucinations  . Vicodin [Hydrocodone-Acetaminophen] Other (See Comments)    hallucinations  . Sulfonamide Derivatives Rash    Medications; Prior to Admission medications   Medication Sig Start Date End Date Taking? Authorizing Provider  acetaminophen (TYLENOL) 325 MG tablet Take 650 mg by mouth every 4 (four) hours as needed. pain   Yes Historical Provider, MD  albuterol (PROVENTIL HFA) 108 (90 BASE) MCG/ACT inhaler Inhale 2 puffs into the lungs every 4 (four) hours as needed. For shortness of breath    Yes Historical Provider, MD  Ascorbic Acid (VITAMIN C) 500 MG tablet Take 500 mg by mouth 2 (two) times daily.    Yes Historical Provider, MD  atorvastatin (LIPITOR) 40 MG tablet TAKE 1 TABLET BY MOUTH EVERY NIGHT AT BEDTIME 11/13/12  Yes [name], MD  benzonatate (TESSALON) 100 MG capsule Take 100 mg by mouth every 8 (eight) hours.  Yes Historical Provider, MD  Bimatoprost (LUMIGAN) 0.01 % SOLN Place 1 drop into both eyes at bedtime. Both eyes at hour of sleep   Yes Historical Provider, MD  Calcium Carbonate-Vitamin D (CALCIUM + D) 600-200 MG-UNIT per tablet Take 1 tablet by mouth daily.    Yes Historical Provider, MD  carbamazepine (TEGRETOL) 200 MG tablet TAKE 1 TABLET BY MOUTH TWICE DAILY 05/04/13  Yes [name], MD  celecoxib (CELEBREX) 200 MG capsule Take 200 mg by mouth 2 (two) times daily.   Yes Historical Provider, MD   Difluprednate (DUREZOL) 0.05 % EMUL Place 1 drop into both eyes daily.    Yes Historical Provider, MD  docusate sodium (COLACE) 100 MG capsule Take 100 mg by mouth 2 (two) times daily.    Yes Historical Provider, MD  esomeprazole (NEXIUM) 40 MG capsule Take 40 mg by mouth daily.    Yes Historical Provider, MD  fexofenadine (ALLEGRA) 180 MG tablet Take 180 mg by mouth daily.    Yes Historical Provider, MD  multivitamin [facility]) per tablet Take 1 tablet by mouth daily.    Yes Historical Provider, MD  nitroGLYCERIN (NITROSTAT) 0.4 MG SL tablet Place 0.4 mg under the tongue every 5 (five) minutes as needed for chest pain.   Yes Historical Provider, MD  warfarin (COUMADIN) 7.5 MG tablet daily 11/13/12  Yes Baker Pierini, FNP  nitroGLYCERIN (NITROSTAT) 0.4 MG SL tablet Place 1 tablet (0.4 mg total) under the tongue every 5 (five) minutes x 3 doses as needed for chest pain. 07/28/11 07/27/12  Rhetta Mura, MD   . atorvastatin  40 mg Oral q1800  . benzonatate  100 mg Oral Q8H  . carbamazepine  200 mg Oral BID  . ciprofloxacin  400 mg Intravenous BID  . latanoprost  1 drop Both Eyes QHS  . loratadine  10 mg Oral Daily  . metoprolol tartrate  25 mg Oral BID  . metronidazole  500 mg Intravenous Q8H  . phytonadione  2.5 mg Oral Once  . prednisoLONE acetate  1 drop Both Eyes Daily  . sodium chloride  3 mL Intravenous Q12H  . vitamin C  500 mg Oral BID   PRN Meds acetaminophen, acetaminophen, albuterol, nitroGLYCERIN, ondansetron (ZOFRAN) IV, ondansetron Results for orders placed during the hospital encounter of 06/01/13 (from the past 48 hour(s))  CBC WITH DIFFERENTIAL     Status: Abnormal   Collection Time    06/01/13  5:23 PM      Result Value Range   WBC 21.1 (*) 4.0 - 10.5 K/uL   RBC 5.06  4.22 - 5.81 MIL/uL   Hemoglobin 16.7  13.0 - 17.0 g/dL   HCT 14.7  82.9 - 56.2 %   MCV 92.1  78.0 - 100.0 fL   MCH 33.0  26.0 - 34.0 pg   MCHC 35.8  30.0 - 36.0 g/dL   RDW 13.0  86.5 - 78.4  %   Platelets 158  150 - 400 K/uL   Neutrophils Relative % 84 (*) 43 - 77 %   Neutro Abs 17.6 (*) 1.7 - 7.7 K/uL   Lymphocytes Relative 5 (*) 12 - 46 %   Lymphs Abs 1.1  0.7 - 4.0 K/uL   Monocytes Relative 11  3 - 12 %   Monocytes Absolute 2.4 (*) 0.1 - 1.0 K/uL   Eosinophils Relative 0  0 - 5 %   Eosinophils Absolute 0.0  0.0 - 0.7 K/uL   Basophils Relative 0  0 -  1 %   Basophils Absolute 0.0  0.0 - 0.1 K/uL  COMPREHENSIVE METABOLIC PANEL     Status: Abnormal   Collection Time    06/01/13  5:23 PM      Result Value Range   Sodium 140  135 - 145 mEq/L   Potassium 4.0  3.5 - 5.1 mEq/L   Chloride 101  96 - 112 mEq/L   CO2 26  19 - 32 mEq/L   Glucose, Bld 138 (*) 70 - 99 mg/dL   BUN 13  6 - 23 mg/dL   Creatinine, Ser 7.82  0.50 - 1.35 mg/dL   Calcium 8.7  8.4 - 95.6 mg/dL   Total Protein 7.1  6.0 - 8.3 g/dL   Albumin 3.6  3.5 - 5.2 g/dL   AST 25  0 - 37 U/L   ALT 14  0 - 53 U/L   Alkaline Phosphatase 69  39 - 117 U/L   Total Bilirubin 0.4  0.3 - 1.2 mg/dL   GFR calc non Af Amer 65 (*) >90 mL/min   GFR calc Af Amer 75 (*) >90 mL/min   Comment: (NOTE)     The eGFR has been calculated using the CKD EPI equation.     This calculation has not been validated in all clinical situations.     eGFR's persistently <90 mL/min signify possible Chronic Kidney     Disease.  LIPASE, BLOOD     Status: None   Collection Time    06/01/13  5:23 PM      Result Value Range   Lipase 11  11 - 59 U/L  OCCULT BLOOD, POC DEVICE     Status: Abnormal   Collection Time    06/01/13  5:51 PM      Result Value Range   Fecal Occult Bld POSITIVE (*) NEGATIVE  TYPE AND SCREEN     Status: None   Collection Time    06/01/13  7:06 PM      Result Value Range   ABO/RH(D) O POS     Antibody Screen NEG     Sample Expiration 06/04/2013    LACTIC ACID, PLASMA     Status: None   Collection Time    06/01/13  8:51 PM      Result Value Range   Lactic Acid, Venous 1.5  0.5 - 2.2 mmol/L  CLOSTRIDIUM DIFFICILE  BY PCR     Status: None   Collection Time    06/01/13 10:25 PM      Result Value Range   C difficile by pcr NEGATIVE  NEGATIVE  STOOL CULTURE     Status: None   Collection Time    06/01/13 10:25 PM      Result Value Range   Specimen Description STOOL     Special Requests NONE     Culture       Value: Culture reincubated for better growth     Performed at [facility]   Report Status PENDING    CBC     Status: Abnormal   Collection Time    06/01/13 11:35 PM      Result Value Range   WBC 20.7 (*) 4.0 - 10.5 K/uL   RBC 5.12  4.22 - 5.81 MIL/uL   Hemoglobin 16.7  13.0 - 17.0 g/dL   HCT 21.3  08.6 - 57.8 %   MCV 90.8  78.0 - 100.0 fL   MCH 32.6  26.0 - 34.0 pg  MCHC 35.9  30.0 - 36.0 g/dL   RDW 96.0  45.4 - 09.8 %   Platelets 139 (*) 150 - 400 K/uL  PROTIME-INR     Status: Abnormal   Collection Time    06/02/13  5:41 AM      Result Value Range   Prothrombin Time 48.7 (*) 11.6 - 15.2 seconds   INR 5.73 (*) 0.00 - 1.49   Comment: CRITICAL RESULT CALLED TO, READ BACK BY AND VERIFIED WITH:     MOHAMMED A RN 06/02/13 0659 COSTELLO B     REPEATED TO VERIFY  BASIC METABOLIC PANEL     Status: Abnormal   Collection Time    06/02/13  5:41 AM      Result Value Range   Sodium 134 (*) 135 - 145 mEq/L   Potassium 3.4 (*) 3.5 - 5.1 mEq/L   Chloride 99  96 - 112 mEq/L   CO2 22  19 - 32 mEq/L   Glucose, Bld 122 (*) 70 - 99 mg/dL   BUN 15  6 - 23 mg/dL   Creatinine, Ser 1.19  0.50 - 1.35 mg/dL   Calcium 8.2 (*) 8.4 - 10.5 mg/dL   GFR calc non Af Amer 69 (*) >90 mL/min   GFR calc Af Amer 79 (*) >90 mL/min   Comment: (NOTE)     The eGFR has been calculated using the CKD EPI equation.     This calculation has not been validated in all clinical situations.     eGFR's persistently <90 mL/min signify possible Chronic Kidney     Disease.  CBC     Status: Abnormal   Collection Time    06/02/13  5:41 AM      Result Value Range   WBC 21.5 (*) 4.0 - 10.5 K/uL   RBC 4.83  4.22 -  5.81 MIL/uL   Hemoglobin 15.6  13.0 - 17.0 g/dL   HCT 14.7  82.9 - 56.2 %   MCV 91.9  78.0 - 100.0 fL   MCH 32.3  26.0 - 34.0 pg   MCHC 35.1  30.0 - 36.0 g/dL   RDW 13.0  86.5 - 78.4 %   Platelets 147 (*) 150 - 400 K/uL    Ct Abdomen Pelvis W Contrast  06/01/2013   CLINICAL DATA:  Left lower quadrant pain and tenderness. Nausea vomiting. Bloody diarrhea.  EXAM: CT ABDOMEN AND PELVIS WITH CONTRAST  TECHNIQUE: Multidetector CT imaging of the abdomen and pelvis was performed using the standard protocol following bolus administration of intravenous contrast.  CONTRAST:  OMNIPAQUE IOHEXOL 300 MG/ML  SOLN  COMPARISON:  08/23/2011  FINDINGS: There is moderate diffuse colonic wall thickening with sparing of terminal ileum and appendix. This is consistent with diffuse colitis, likely pseudomembranous colitis or other infectious etiology. There is no evidence of abscess, free fluid, or bowel obstruction.  Several hepatic and renal cysts remains stable. No masses or lymphadenopathy identified. Prior cholecystectomy noted. The pancreas, spleen, and adrenal glands are normal in appearance. No evidence of hydronephrosis. Right hip prosthesis results in beam hardening artifact through the inferior pelvis. Moderately enlarged prostate gland again noted, without change. An old L2 vertebral body compression fracture is unchanged.  IMPRESSION: Moderate diffuse colitis, likely due to C difficile or other infectious colitis. No evidence of abscess or other complication.  Stable moderately enlarged prostate and old L2 vertebral body compression fracture.   Electronically Signed   By: Myles Rosenthal   On: 06/01/2013 19:50  ROS: Agree with ROS obtained by Dr Toniann Fail            Blood pressure 109/67, pulse 106, temperature 98.7 F (37.1 C), temperature source Oral, resp. rate 20, height 6\' 2"  (1.88 m), weight 89.404 kg (197 lb 1.6 oz), SpO2 95.00%.  Physical exam:   General-- pleasant white male in no  acute distress Heart-- regular rate and rhythm without murmurs are gallops  Lungs--clear Abdomen-- bowel sounds present and soft with mild to tenderness more left-sided  Assessment: 1. Acute rectal bleeding 2. Over anticoagulation 3. Diffuse colitis in CT scan with primarily left-sided abdominal pain  this picture is most consistent with ischemic colitis or possibly infectious colitis. Clearly is over anticoagulation is contributing to the bleeding.  Plan: 1. Agree with empiric Cipro Flagyl  2. Agree with correcting INR. 3. We'll keep on clear liquids for now and will go ahead and add Miralax. The patient will likely need colonoscopy and/or sigmoidoscopy once his INR has corrected to evaluate for ischemic colitis   Ayla Dunigan JR,Alek Borges L 06/02/2013, 12:22 PM

## 2013-06-04 NOTE — Progress Notes (Signed)
PT Cancellation Note  Patient Details Name: Eric [name] MRN: [id number] DOB: December 19, 1930   Cancelled Treatment:    Reason Eval/Treat Not Completed: Patient at procedure or test/unavailable   [name],[name] 06/04/2013, 8:44 AM Zenovia Jarred, PT, DPT 06/04/2013 Pager: [phone]

## 2013-06-04 NOTE — Op Note (Signed)
[facility] [address] [place], [address]   FLEXIBLE SIGMOIDOSCOPY PROCEDURE REPORT  PATIENT: Eric, [name]  MR#: [id number] BIRTHDATE: Mar 30, 1931 , 81  yrs. old GENDER: Male ENDOSCOPIST: Carman Ching, MD REFERRED BY:  [facility]. PCP: Dr. Amador Cunas. PROCEDURE DATE:  06/04/2013 PROCEDURE:   Flexible Sigmoidoscopy with Biopsy ASA CLASS:    class III INDICATIONS:  patient presented with abdominal pain and rectal bleeding with INR markedly increased. CT scan showed diffuse thickening of the colon in his tender on the left side. Etiology of the colitis is unclear. MEDICATIONS: fentanyl 50 mcg, versed 3 mg IV  DESCRIPTION OF PROCEDURE:   The Pentax pediatric: the scope was inserted following digital rectal exam. The patient had been prepped with tap water enemas. Upon entering the rectum it was noted that prep was fairly good and we advanced up to approximately 100 cm in the vicinity of the splenic flexure. The scope was in withdrawn. There was diffuse colonic edema and inflammation throughout the entire left colon. There were multiple small shallow ulcers, so mucosal hemorrhage and marked edema. Multiple random biopsies were obtained and placed in a single jar. There was rectal sparing and from approximately 25 cm to the anal verge, there was no inflammation with a good mucosal pattern with no edema. The scope was withdrawn in the patient tolerated procedure well. There were no evidence of pseudomembranes.        COMPLICATIONS:  None  ENDOSCOPIC IMPRESSION: 1. Diffuse Left-Sided Colitis. There is rectosigmoid sparing. This is most consistent with ischemia  as etiology. The endoscopic appearance is not entirely characteristic  RECOMMENDATIONS:  . 1. Would continue conservative therapy with antibiotics, liquids, pain control etc. 2. Would hold anticoagulation for several days until bleeding is clearly stopped. 3. Will check  path report.   _______________________________ eSignedCarman Ching, MD 06/04/2013 9:18 AM CC: Dr. [name]

## 2013-06-04 NOTE — Progress Notes (Signed)
[facility] PROGRESS NOTE  Eric [name] EAV:[id number] DOB: [birth year] DOA: 06/01/2013 PCP: Rogelia Boga, MD  Subjective: Diarrhea continues, very minimal bleeding  Assessment/Plan:  Principal Problem:  Colitis  - Sigmoidoscopy 912-more suggestive of Ischemic - Diarrhea has improved somwhat - Stool C. difficile PCR negative, Stool culture neg so far, ova parasites neg, and a GI pathogen panel PCR neg as well.  - Continue with Cipro and Flagyl- day 4-but if ischemic, suspect we can stop over the next few days  Lower GI bleed  - In a setting of severe colitis and supratherapeutic INR  - Hemoglobin remains stable  - One time dose of 2.5 mg Vitamin K given 9/10. INR today has now decreased to 1.68 - Continue to monitor hemoglobin and hematocrit closely  INR   - Monitor INR closely  - Continue Coumadin-per pharmacy-since rectal bleed has decreased  Atrial fibrillation  - Rate initially not optimal, increased with activity- started metoprolol on 9/10-much better rate control - Please see above regarding Coumadin   History of trigeminal neuralgia  - Continue with Tegretol   Dyslipidemia  - Continue with statin   History of CVA  - Underlying A. fib, still on Coumadin- see above   History of glaucoma  - Stable   Disposition:  Remain inpatient   DVT Prophylaxis:  not needed- INR is within therapeutic range for prophylatic anticoagulation for Afib.  Code Status:  Full code   Family Communication  No family at bedside -but Dr Jerral Ralph did discuss with son over phone on 9/10  Procedures:  sigmoidoscopy 9/12  CONSULTS:   GI    Objective: Filed Vitals:   06/04/13 1102  BP: 118/57  Pulse: 98  Temp: 97.8 F (36.6 C)  Resp: 16    Intake/Output Summary (Last 24 hours) at 06/04/13 1134 Last data filed at 06/04/13 0600  Gross per 24 hour  Intake 823.33 ml  Output      0 ml  Net 823.33 ml   Filed Weights   06/01/13 2149 06/02/13 0549  06/03/13 0513  Weight: 89.449 kg (197 lb 3.2 oz) 89.404 kg (197 lb 1.6 oz) 89.223 kg (196 lb 11.2 oz)    Exam:   General:  Well appearing male in no acute distress resting comfortable in bed.  Cardiovascular: Irregular heart rate due to known diagnosis of atrial fibrillation.   Respiratory: Non labored, CTAB  Abdomen: Continues to be moderately tender to palpation along the LLQ and somewhat into the RLQ.  Mild discomfort with deep palpation of epigastrium.   Musculoskeletal: Full ROM and strength  Data Reviewed: Basic Metabolic Panel:  Recent Labs Lab 06/01/13 1723 06/02/13 0541 06/03/13 0705 06/04/13 0610  NA 140 134* 136 135  K 4.0 3.4* 3.4* 3.1*  CL 101 99 101 101  CO2 26 22 25 24   GLUCOSE 138* 122* 106* 117*  BUN 13 15 17 13   CREATININE 1.06 1.01 1.08 0.92  CALCIUM 8.7 8.2* 8.1* 8.5   Liver Function Tests:  Recent Labs Lab 06/01/13 1723  AST 25  ALT 14  ALKPHOS 69  BILITOT 0.4  PROT 7.1  ALBUMIN 3.6    Recent Labs Lab 06/01/13 1723  LIPASE 11   CBC:  Recent Labs Lab 06/01/13 1723 06/01/13 2335 06/02/13 0541 06/03/13 0705 06/04/13 0610  WBC 21.1* 20.7* 21.5* 17.5* 14.7*  NEUTROABS 17.6*  --   --   --   --   HGB 16.7 16.7 15.6 13.5 13.7  HCT 46.6 46.5 44.4 38.6*  38.7*  MCV 92.1 90.8 91.9 91.9 89.8  PLT 158 139* 147* 137* 157    Recent Results (from the past 240 hour(s))  CLOSTRIDIUM DIFFICILE BY PCR     Status: None   Collection Time    06/01/13 10:25 PM      Result Value Range Status   C difficile by pcr NEGATIVE  NEGATIVE Final  STOOL CULTURE     Status: None   Collection Time    06/01/13 10:25 PM      Result Value Range Status   Specimen Description STOOL   Final   Special Requests NONE   Final   Culture     Final   Value: NO SUSPICIOUS COLONIES, CONTINUING TO HOLD     Performed at [facility]   Report Status PENDING   Incomplete  OVA AND PARASITE EXAMINATION     Status: None   Collection Time    06/02/13 12:22 PM       Result Value Range Status   Specimen Description PERIRECTAL   Final   Special Requests NONE   Final   Ova and parasites     Final   Value: NO OVA OR PARASITES SEEN     Performed at [facility]   Report Status 06/03/2013 FINAL   Final     Studies: No results found.  Scheduled Meds: . atorvastatin  40 mg Oral q1800  . benzonatate  100 mg Oral Q8H  . carbamazepine  200 mg Oral BID  . ciprofloxacin  400 mg Intravenous BID  . latanoprost  1 drop Both Eyes QHS  . loratadine  10 mg Oral Daily  . metoprolol tartrate  25 mg Oral BID  . metronidazole  500 mg Intravenous Q8H  . polyethylene glycol  17 g Oral TID  . potassium chloride  10 mEq Intravenous Q1 Hr x 2  . prednisoLONE acetate  1 drop Both Eyes Daily  . sodium chloride  3 mL Intravenous Q12H  . vitamin C  500 mg Oral BID  . warfarin  1 mg Oral ONCE-1800  . warfarin  5 mg Oral ONCE-1800  . Warfarin - Pharmacist Dosing Inpatient   Does not apply q1800   Continuous Infusions: . sodium chloride Stopped (06/03/13 2305)    Principal Problem:   Colitis Active Problems:   Atrial fibrillation   RECTAL BLEEDING   Coagulopathy    Jeoffrey Massed, PAS-2, Physician Assistant Student  [facility] 06/04/2013, 11:34 AM  LOS: 3 days    Attending -Patient seen and examined,agree with the above assessment and plan. Much better today-vomiting resolved, diarrhea has slowed down, minimal blood with stools. Continue with current management as outlined above.  Windell Norfolk MD

## 2013-06-05 DIAGNOSIS — G500 Trigeminal neuralgia: Secondary | ICD-10-CM

## 2013-06-05 LAB — BASIC METABOLIC PANEL
BUN: 8 mg/dL (ref 6–23)
CO2: 25 mEq/L (ref 19–32)
Calcium: 8.2 mg/dL — ABNORMAL LOW (ref 8.4–10.5)
Creatinine, Ser: 0.81 mg/dL (ref 0.50–1.35)
GFR calc non Af Amer: 82 mL/min — ABNORMAL LOW (ref >90)
Glucose, Bld: 118 mg/dL — ABNORMAL HIGH (ref 70–99)
Sodium: 137 mEq/L (ref 135–145)

## 2013-06-05 LAB — CBC
Hemoglobin: 12.8 g/dL — ABNORMAL LOW (ref 13.0–17.0)
MCH: 30.8 pg (ref 26.0–34.0)
MCHC: 34 g/dL (ref 30.0–36.0)
MCV: 90.4 fL (ref 78.0–100.0)
RBC: 4.16 MIL/uL — ABNORMAL LOW (ref 4.22–5.81)

## 2013-06-05 LAB — STOOL CULTURE

## 2013-06-05 LAB — PROTIME-INR: INR: 1.66 — ABNORMAL HIGH (ref 0.00–1.49)

## 2013-06-05 MED ORDER — WARFARIN SODIUM 5 MG PO TABS
5.0000 mg | ORAL_TABLET | Freq: Once | ORAL | Status: AC
  Administered 2013-06-05: 17:00:00 5 mg via ORAL
  Filled 2013-06-05: qty 1

## 2013-06-05 MED ORDER — POTASSIUM CHLORIDE CRYS ER 20 MEQ PO TBCR
40.0000 meq | EXTENDED_RELEASE_TABLET | Freq: Once | ORAL | Status: AC
  Administered 2013-06-05: 08:00:00 40 meq via ORAL
  Filled 2013-06-05: qty 2

## 2013-06-05 NOTE — Progress Notes (Signed)
PHARMACY FOLLOW UP NOTE  Pharmacy Consult for : Coumadin Indication:  atrial fibrillation  Dosing Weight: 89 kg  Labs:  Recent Labs  06/03/13 0705 06/04/13 0610 06/05/13 0605  HGB 13.5 13.7 12.8*  HCT 38.6* 38.7* 37.6*  PLT 137* 157 179  LABPROT 26.0* 19.3* 19.1*  INR 2.48* 1.68* 1.66*  CREATININE 1.08 0.92 0.81   Lab Results  Component Value Date   INR 1.66* 06/05/2013   INR 1.68* 06/04/2013   INR 2.48* 06/03/2013    Medications:  Scheduled:  . atorvastatin  40 mg Oral q1800  . benzonatate  100 mg Oral Q8H  . carbamazepine  200 mg Oral BID  . ciprofloxacin  400 mg Intravenous BID  . latanoprost  1 drop Both Eyes QHS  . loratadine  10 mg Oral Daily  . metoprolol tartrate  25 mg Oral BID  . metronidazole  500 mg Intravenous Q8H  . polyethylene glycol  17 g Oral TID  . prednisoLONE acetate  1 drop Both Eyes Daily  . sodium chloride  3 mL Intravenous Q12H  . vitamin C  500 mg Oral BID  . Warfarin - Pharmacist Dosing Inpatient   Does not apply q1800   Anti-infectives   Start     Dose/Rate Route Frequency Ordered Stop   06/01/13 2330  metroNIDAZOLE (FLAGYL) IVPB 500 mg     500 mg 100 mL/hr over 60 Minutes Intravenous Every 8 hours 06/01/13 2238     06/01/13 2330  ciprofloxacin (CIPRO) IVPB 400 mg     400 mg 200 mL/hr over 60 Minutes Intravenous 2 times daily 06/01/13 2259     06/01/13 2015  ciprofloxacin (CIPRO) IVPB 400 mg  Status:  Discontinued     400 mg 200 mL/hr over 60 Minutes Intravenous  Once 06/01/13 2007 06/01/13 2009   06/01/13 2015  metroNIDAZOLE (FLAGYL) IVPB 500 mg  Status:  Discontinued     500 mg 100 mL/hr over 60 Minutes Intravenous  Once 06/01/13 2007 06/01/13 2009      Assessment:  79 y.o.male admitted for colitis and GIB.  His baseline INR was SUPRAtheraeutic and INR has continued to drop to 1.68.  Coumadin restarted 9/12. INR today 1.66. No further frank bleeding complications per RN noted. Hgb dec today 13.7 > 12.8.  Home warfarin dose  listed on PTA list as 7.5 mg daily   Goal of Therapy:  INR 2-3   Plan:  Coumadin 5 mg today. Remaining on conservative side d/t recent bleeding.  Daily INRs, CBC.  Monitor for bleeding complications.    Marybelle Giraldo C. Akeyla Molden, PharmD Clinical Pharmacist-Resident Pager: [phone] Pharmacy: [phone] 06/05/2013 9:19 AM

## 2013-06-05 NOTE — Progress Notes (Signed)
[facility] PROGRESS NOTE Subjective Stools loose, decreasing. No blood. GI Path panel negative.  Objective: Vital signs in last 24 hours: Temp:  [98.2 F (36.8 C)-98.4 F (36.9 C)] 98.4 F (36.9 C) (09/13 0619) Pulse Rate:  [71-82] 82 (09/13 0619) Resp:  [16-18] 18 (09/13 0619) BP: (98-109)/(60-70) 98/60 mmHg (09/13 0619) SpO2:  [96 %-98 %] 98 % (09/13 0619) Weight:  [89.585 kg (197 lb 8 oz)] 89.585 kg (197 lb 8 oz) (09/13 0619) Last BM Date: 06/04/13  Intake/Output from previous day: 09/12 0701 - 09/13 0700 In: 1188 [P.O.:380; I.V.:508; IV Piggyback:300] Out: -  Intake/Output this shift:    PE:  Abdomen--soft nontender  Lab Results:  Recent Labs  06/03/13 0705 06/04/13 0610 06/05/13 0605  WBC 17.5* 14.7* 9.5  HGB 13.5 13.7 12.8*  HCT 38.6* 38.7* 37.6*  PLT 137* 157 179   BMET  Recent Labs  06/03/13 0705 06/04/13 0610 06/05/13 0605  NA 136 135 137  K 3.4* 3.1* 3.2*  CL 101 101 102  CO2 25 24 25   CREATININE 1.08 0.92 0.81   LFT No results found for this basename: PROT, AST, ALT, ALKPHOS, BILITOT, BILIDIR, IBILI,  in the last 72 hours PT/INR  Recent Labs  06/03/13 0705 06/04/13 0610 06/05/13 0605  LABPROT 26.0* 19.3* 19.1*  INR 2.48* 1.68* 1.66*   PANCREAS No results found for this basename: LIPASE,  in the last 72 hours       Studies/Results: No results found.  Medications: I have reviewed the patient's current medications.  Assessment/Plan: 1. Colitis. Infectious vs ischemic, better with AB path pending, will follow.   Tejay Hubert JR,Terell Kincy L 06/05/2013, 11:44 AM

## 2013-06-05 NOTE — Progress Notes (Signed)
PATIENT DETAILS Name: Eric [name] Age: 79 y.o. Sex: male Date of Birth: 31-Dec-1930 Admit Date: 06/01/2013 Admitting Physician Eduard Clos, MD WUJ:[id number],AOZHY Homero Fellers, MD  Subjective: Much better-only 2 BM's overnight-more formed, not further rectal bleeding  Assessment/Plan: Colitis  -admitted with vomiting and bloody diarrhea-initially thought to be infectious, but - Sigmoidoscopy on 9/12-more suggestive of Ischemic . GI following - Diarrhea has improved significantly-stools more formed, no further blood in stools today - Stool C. difficile PCR negative, Stool culture neg so far, ova parasites neg, and a GI pathogen panel PCR neg as well.  - Continue with Cipro and Flagyl- day 5-but if ischemic, suspect we can stop over the next few days   Lower GI bleed  - In a setting of severe colitis and supratherapeutic INR-thankfully this seems to have resolved - Hemoglobin remains stable, slowly trended down to 12.8 - One time dose of 2.5 mg Vitamin K given 9/10.  - Continue to monitor hemoglobin and hematocrit closely   INR  - Monitor INR closely  - Continue Coumadin-per pharmacy-since rectal bleed has decreased and now has resolved  Atrial fibrillation  - Rate initially not optimal, increased with activity- started metoprolol on 9/10-much better rate control  - Please see above regarding Coumadin   History of trigeminal neuralgia  - Continue with Tegretol   Dyslipidemia  - Continue with statin   History of CVA  - Underlying A. fib, still on Coumadin- see above   History of glaucoma  - Stable   Disposition:  Remain inpatient-? home in am if clinical improvement continues  DVT Prophylaxis:  not needed- on coumadin  Code Status:  Full code   Family Communication  No family at bedside -but Dr Jerral Ralph did discuss with son over phone on 9/10 and 9/12  Procedures:  sigmoidoscopy 9/12   MEDICATIONS: Scheduled Meds: . atorvastatin  40 mg Oral q1800  .  benzonatate  100 mg Oral Q8H  . carbamazepine  200 mg Oral BID  . ciprofloxacin  400 mg Intravenous BID  . latanoprost  1 drop Both Eyes QHS  . loratadine  10 mg Oral Daily  . metoprolol tartrate  25 mg Oral BID  . metronidazole  500 mg Intravenous Q8H  . polyethylene glycol  17 g Oral TID  . prednisoLONE acetate  1 drop Both Eyes Daily  . sodium chloride  3 mL Intravenous Q12H  . vitamin C  500 mg Oral BID  . warfarin  5 mg Oral ONCE-1800  . Warfarin - Pharmacist Dosing Inpatient   Does not apply q1800   Continuous Infusions: . sodium chloride 1,000 mL (06/04/13 1635)   PRN Meds:.acetaminophen, acetaminophen, albuterol, nitroGLYCERIN, ondansetron (ZOFRAN) IV, ondansetron  Antibiotics: Anti-infectives   Start     Dose/Rate Route Frequency Ordered Stop   06/01/13 2330  metroNIDAZOLE (FLAGYL) IVPB 500 mg     500 mg 100 mL/hr over 60 Minutes Intravenous Every 8 hours 06/01/13 2238     06/01/13 2330  ciprofloxacin (CIPRO) IVPB 400 mg     400 mg 200 mL/hr over 60 Minutes Intravenous 2 times daily 06/01/13 2259     06/01/13 2015  ciprofloxacin (CIPRO) IVPB 400 mg  Status:  Discontinued     400 mg 200 mL/hr over 60 Minutes Intravenous  Once 06/01/13 2007 06/01/13 2009   06/01/13 2015  metroNIDAZOLE (FLAGYL) IVPB 500 mg  Status:  Discontinued     500 mg 100 mL/hr over 60 Minutes Intravenous  Once 06/01/13 2007 06/01/13  2009       PHYSICAL EXAM: Vital signs in last 24 hours: Filed Vitals:   06/04/13 0920 06/04/13 1102 06/04/13 2136 06/05/13 0619  BP: 115/59 118/57 109/70 98/60  Pulse:  98 71 82  Temp:  97.8 F (36.6 C) 98.2 F (36.8 C) 98.4 F (36.9 C)  TempSrc:  Oral Oral Oral  Resp: 11 16 16 18   Height:      Weight:    89.585 kg (197 lb 8 oz)  SpO2: 100% 96% 96% 98%    Weight change:  Filed Weights   06/02/13 0549 06/03/13 0513 06/05/13 0619  Weight: 89.404 kg (197 lb 1.6 oz) 89.223 kg (196 lb 11.2 oz) 89.585 kg (197 lb 8 oz)   Body mass index is 25.35 kg/(m^2).    Gen Exam: Awake and alert with clear speech.   Neck: Supple, No JVD.   Chest: B/L Clear.   CVS: S1 S2 Regular, no murmurs.  Abdomen: soft, BS +, non tender, non distended.  Extremities: no edema, lower extremities warm to touch. Neurologic: Non Focal.   Skin: No Rash.   Wounds: N/A.   Intake/Output from previous day:  Intake/Output Summary (Last 24 hours) at 06/05/13 1038 Last data filed at 06/05/13 1610  Gross per 24 hour  Intake   1188 ml  Output      0 ml  Net   1188 ml     LAB RESULTS: CBC  Recent Labs Lab 06/01/13 1723 06/01/13 2335 06/02/13 0541 06/03/13 0705 06/04/13 0610 06/05/13 0605  WBC 21.1* 20.7* 21.5* 17.5* 14.7* 9.5  HGB 16.7 16.7 15.6 13.5 13.7 12.8*  HCT 46.6 46.5 44.4 38.6* 38.7* 37.6*  PLT 158 139* 147* 137* 157 179  MCV 92.1 90.8 91.9 91.9 89.8 90.4  MCH 33.0 32.6 32.3 32.1 31.8 30.8  MCHC 35.8 35.9 35.1 35.0 35.4 34.0  RDW 14.0 14.2 14.2 14.0 13.7 14.0  LYMPHSABS 1.1  --   --   --   --   --   MONOABS 2.4*  --   --   --   --   --   EOSABS 0.0  --   --   --   --   --   BASOSABS 0.0  --   --   --   --   --     Chemistries   Recent Labs Lab 06/01/13 1723 06/02/13 0541 06/03/13 0705 06/04/13 0610 06/05/13 0605  NA 140 134* 136 135 137  K 4.0 3.4* 3.4* 3.1* 3.2*  CL 101 99 101 101 102  CO2 26 22 25 24 25   GLUCOSE 138* 122* 106* 117* 118*  BUN 13 15 17 13 8   CREATININE 1.06 1.01 1.08 0.92 0.81  CALCIUM 8.7 8.2* 8.1* 8.5 8.2*    CBG: No results found for this basename: GLUCAP,  in the last 168 hours  GFR Estimated Creatinine Clearance: 86 ml/min (by C-G formula based on Cr of 0.81).  Coagulation profile  Recent Labs Lab 06/01/13 06/02/13 0541 06/03/13 0705 06/04/13 0610 06/05/13 0605  INR 5.2 5.73* 2.48* 1.68* 1.66*    Cardiac Enzymes No results found for this basename: CK, CKMB, TROPONINI, MYOGLOBIN,  in the last 168 hours  No components found with this basename: POCBNP,  No results found for this basename:  DDIMER,  in the last 72 hours No results found for this basename: HGBA1C,  in the last 72 hours No results found for this basename: CHOL, HDL, LDLCALC, TRIG, CHOLHDL, LDLDIRECT,  in  the last 72 hours No results found for this basename: TSH, T4TOTAL, FREET3, T3FREE, THYROIDAB,  in the last 72 hours No results found for this basename: VITAMINB12, FOLATE, FERRITIN, TIBC, IRON, RETICCTPCT,  in the last 72 hours No results found for this basename: LIPASE, AMYLASE,  in the last 72 hours  Urine Studies No results found for this basename: UACOL, UAPR, USPG, UPH, UTP, UGL, UKET, UBIL, UHGB, UNIT, UROB, ULEU, UEPI, UWBC, URBC, UBAC, CAST, CRYS, UCOM, BILUA,  in the last 72 hours  MICROBIOLOGY: Recent Results (from the past 240 hour(s))  CLOSTRIDIUM DIFFICILE BY PCR     Status: None   Collection Time    06/01/13 10:25 PM      Result Value Range Status   C difficile by pcr NEGATIVE  NEGATIVE Final  STOOL CULTURE     Status: None   Collection Time    06/01/13 10:25 PM      Result Value Range Status   Specimen Description STOOL   Final   Special Requests NONE   Final   Culture     Final   Value: NO SUSPICIOUS COLONIES, CONTINUING TO HOLD     Performed at [facility]   Report Status PENDING   Incomplete  OVA AND PARASITE EXAMINATION     Status: None   Collection Time    06/02/13 12:22 PM      Result Value Range Status   Specimen Description PERIRECTAL   Final   Special Requests NONE   Final   Ova and parasites     Final   Value: NO OVA OR PARASITES SEEN     Performed at [facility]   Report Status 06/03/2013 FINAL   Final    RADIOLOGY STUDIES/RESULTS: Ct Abdomen Pelvis W Contrast  06/01/2013   CLINICAL DATA:  Left lower quadrant pain and tenderness. Nausea vomiting. Bloody diarrhea.  EXAM: CT ABDOMEN AND PELVIS WITH CONTRAST  TECHNIQUE: Multidetector CT imaging of the abdomen and pelvis was performed using the standard protocol following bolus administration of intravenous  contrast.  CONTRAST:  OMNIPAQUE IOHEXOL 300 MG/ML  SOLN  COMPARISON:  08/23/2011  FINDINGS: There is moderate diffuse colonic wall thickening with sparing of terminal ileum and appendix. This is consistent with diffuse colitis, likely pseudomembranous colitis or other infectious etiology. There is no evidence of abscess, free fluid, or bowel obstruction.  Several hepatic and renal cysts remains stable. No masses or lymphadenopathy identified. Prior cholecystectomy noted. The pancreas, spleen, and adrenal glands are normal in appearance. No evidence of hydronephrosis. Right hip prosthesis results in beam hardening artifact through the inferior pelvis. Moderately enlarged prostate gland again noted, without change. An old L2 vertebral body compression fracture is unchanged.  IMPRESSION: Moderate diffuse colitis, likely due to C difficile or other infectious colitis. No evidence of abscess or other complication.  Stable moderately enlarged prostate and old L2 vertebral body compression fracture.   Electronically Signed   By: Myles Rosenthal   On: 06/01/2013 19:50    Jeoffrey Massed, MD  [facility] Pager:[phone]  If 7PM-7AM, please contact night-coverage [email or web address] Password [email or web address] 06/05/2013, 10:38 AM   LOS: 4 days

## 2013-06-06 DIAGNOSIS — R791 Abnormal coagulation profile: Secondary | ICD-10-CM

## 2013-06-06 LAB — BASIC METABOLIC PANEL
CO2: 24 mEq/L (ref 19–32)
Chloride: 105 mEq/L (ref 96–112)
Creatinine, Ser: 0.79 mg/dL (ref 0.50–1.35)
GFR calc Af Amer: >90 mL/min (ref >90)
Potassium: 3.2 mEq/L — ABNORMAL LOW (ref 3.5–5.1)
Sodium: 140 mEq/L (ref 135–145)

## 2013-06-06 LAB — PROTIME-INR
INR: 2.33 — ABNORMAL HIGH (ref 0.00–1.49)
Prothrombin Time: 24.8 seconds — ABNORMAL HIGH (ref 11.6–15.2)

## 2013-06-06 MED ORDER — WARFARIN SODIUM 2.5 MG PO TABS
2.5000 mg | ORAL_TABLET | Freq: Once | ORAL | Status: AC
  Administered 2013-06-06: 17:00:00 2.5 mg via ORAL
  Filled 2013-06-06: qty 1

## 2013-06-06 MED ORDER — MENTHOL 3 MG MT LOZG
1.0000 | LOZENGE | OROMUCOSAL | Status: DC | PRN
  Administered 2013-06-07: 10:00:00 3 mg via ORAL
  Filled 2013-06-06: qty 9

## 2013-06-06 MED ORDER — POTASSIUM CHLORIDE CRYS ER 20 MEQ PO TBCR
40.0000 meq | EXTENDED_RELEASE_TABLET | Freq: Once | ORAL | Status: AC
  Administered 2013-06-06: 40 meq via ORAL
  Filled 2013-06-06: qty 2

## 2013-06-06 NOTE — Progress Notes (Signed)
[facility] PROGRESS NOTE Subjective Still some LLQ pain stools loose no bleeding  Objective: Vital signs in last 24 hours: Temp:  [97.9 F (36.6 C)-98.1 F (36.7 C)] 98.1 F (36.7 C) (09/14 0555) Pulse Rate:  [76-90] 90 (09/14 0555) Resp:  [17-18] 17 (09/14 0555) BP: (108-126)/(68-74) 108/74 mmHg (09/14 0555) SpO2:  [94 %-99 %] 94 % (09/14 0555) Weight:  [90.992 kg (200 lb 9.6 oz)] 90.992 kg (200 lb 9.6 oz) (09/14 0558) Last BM Date: 06/05/13  Intake/Output from previous day: 09/13 0701 - 09/14 0700 In: 2220 [P.O.:1080; I.V.:840; IV Piggyback:300] Out: 655 [Urine:452; Stool:203] Intake/Output this shift: Total I/O In: -  Out: 50 [Urine:50]  PE:  Abdomen--nondistended, mild LLQ tenderness  Lab Results:  Recent Labs  06/04/13 0610 06/05/13 0605 06/06/13 0500  WBC 14.7* 9.5  --   HGB 13.7 12.8* 12.8*  HCT 38.7* 37.6* 37.5*  PLT 157 179  --    BMET  Recent Labs  06/04/13 0610 06/05/13 0605 06/06/13 0500  NA 135 137 140  K 3.1* 3.2* 3.2*  CL 101 102 105  CO2 24 25 24   CREATININE 0.92 0.81 0.79   LFT No results found for this basename: PROT, AST, ALT, ALKPHOS, BILITOT, BILIDIR, IBILI,  in the last 72 hours PT/INR  Recent Labs  06/04/13 0610 06/05/13 0605 06/06/13 0500  LABPROT 19.3* 19.1* 24.8*  INR 1.68* 1.66* 2.33*   PANCREAS No results found for this basename: LIPASE,  in the last 72 hours       Studies/Results: No results found.  Medications: I have reviewed the patient's current medications.  Assessment/Plan: 1. Diffuse Lt sided Colitis with Rectal sparing. ? Ischemic [place] other path still pending. Continue AB and supportive treatment.   Maydell Knoebel JR,Eleanor Dimichele L 06/06/2013, 10:18 AM

## 2013-06-06 NOTE — Progress Notes (Signed)
PATIENT DETAILS Name: Eric [name] Age: 79 y.o. Sex: male Date of Birth: 1930-10-29 Admit Date: 06/01/2013 Admitting Physician Eduard Clos, MD ZOX:[id number] Homero Fellers, MD  Subjective: Very mild left lower quadrant pain, no further rectal bleeding.  Assessment/Plan: Colitis  -admitted with vomiting and bloody diarrhea-initially thought to be infectious, but - Sigmoidoscopy on 9/12-more suggestive of Ischemic . GI following - Diarrhea has improved significantly-stools more formed but still loose, no further blood in stools since 9/13. - Stool C. difficile PCR negative, Stool culture neg so far, ova parasites neg, and a GI pathogen panel PCR neg as well.  - Continue with Cipro and Flagyl- day 6-but if ischemic, suspect we can stop over the next few days   Lower GI bleed  - In a setting of severe colitis and supratherapeutic INR-thankfully this seems to have resolved - Hemoglobin remains stable, slowly trended down to 12.8 - One time dose of 2.5 mg Vitamin K given 9/10.  - Continue to monitor hemoglobin and hematocrit closely   INR  - Monitor INR closely  - Continue Coumadin-per pharmacy-since rectal bleed has decreased and now has resolved  Atrial fibrillation  - Rate initially not optimal, increased with activity- started metoprolol on 9/10-much better rate control  - Please see above regarding Coumadin   History of trigeminal neuralgia  - Continue with Tegretol   Dyslipidemia  - Continue with statin   History of CVA  - Underlying A. fib, still on Coumadin- see above   History of glaucoma  - Stable   Disposition:  Remain inpatient-? home in am if clinical improvement continues  DVT Prophylaxis:  not needed- on coumadin  Code Status:  Full code   Family Communication  No family at bedside -but Dr Jerral Ralph did discuss with son over phone on 9/10,9/12 and 9/14  Procedures:  sigmoidoscopy 9/12   MEDICATIONS: Scheduled Meds: . atorvastatin  40 mg  Oral q1800  . benzonatate  100 mg Oral Q8H  . carbamazepine  200 mg Oral BID  . ciprofloxacin  400 mg Intravenous BID  . latanoprost  1 drop Both Eyes QHS  . loratadine  10 mg Oral Daily  . metoprolol tartrate  25 mg Oral BID  . metronidazole  500 mg Intravenous Q8H  . polyethylene glycol  17 g Oral TID  . prednisoLONE acetate  1 drop Both Eyes Daily  . sodium chloride  3 mL Intravenous Q12H  . vitamin C  500 mg Oral BID  . warfarin  2.5 mg Oral ONCE-1800  . Warfarin - Pharmacist Dosing Inpatient   Does not apply q1800   Continuous Infusions: . sodium chloride 15 mL/hr at 06/06/13 0926   PRN Meds:.acetaminophen, acetaminophen, albuterol, nitroGLYCERIN, ondansetron (ZOFRAN) IV, ondansetron  Antibiotics: Anti-infectives   Start     Dose/Rate Route Frequency Ordered Stop   06/01/13 2330  metroNIDAZOLE (FLAGYL) IVPB 500 mg     500 mg 100 mL/hr over 60 Minutes Intravenous Every 8 hours 06/01/13 2238     06/01/13 2330  ciprofloxacin (CIPRO) IVPB 400 mg     400 mg 200 mL/hr over 60 Minutes Intravenous 2 times daily 06/01/13 2259     06/01/13 2015  ciprofloxacin (CIPRO) IVPB 400 mg  Status:  Discontinued     400 mg 200 mL/hr over 60 Minutes Intravenous  Once 06/01/13 2007 06/01/13 2009   06/01/13 2015  metroNIDAZOLE (FLAGYL) IVPB 500 mg  Status:  Discontinued     500 mg 100 mL/hr over 60 Minutes Intravenous  Once 06/01/13 2007 06/01/13 2009       PHYSICAL EXAM: Vital signs in last 24 hours: Filed Vitals:   06/05/13 1609 06/05/13 2128 06/06/13 0555 06/06/13 0558  BP: 117/68 126/74 108/74   Pulse: 76 86 90   Temp: 98 F (36.7 C) 97.9 F (36.6 C) 98.1 F (36.7 C)   TempSrc: Oral Oral Oral   Resp: 18 18 17    Height:      Weight:    90.992 kg (200 lb 9.6 oz)  SpO2: 96% 99% 94%     Weight change: 1.406 kg (3 lb 1.6 oz) Filed Weights   06/03/13 0513 06/05/13 0619 06/06/13 0558  Weight: 89.223 kg (196 lb 11.2 oz) 89.585 kg (197 lb 8 oz) 90.992 kg (200 lb 9.6 oz)   Body  mass index is 25.74 kg/(m^2).   Gen Exam: Awake and alert with clear speech.   Neck: Supple, No JVD.   Chest: B/L Clear.   CVS: S1 S2 Regular, no murmurs.  Abdomen: soft, BS +, non tender, non distended.  Extremities: no edema, lower extremities warm to touch. Neurologic: Non Focal.   Skin: No Rash.   Wounds: N/A.   Intake/Output from previous day:  Intake/Output Summary (Last 24 hours) at 06/06/13 1211 Last data filed at 06/06/13 0803  Gross per 24 hour  Intake   1740 ml  Output    702 ml  Net   1038 ml     LAB RESULTS: CBC  Recent Labs Lab 06/01/13 1723 06/01/13 2335 06/02/13 0541 06/03/13 0705 06/04/13 0610 06/05/13 0605 06/06/13 0500  WBC 21.1* 20.7* 21.5* 17.5* 14.7* 9.5  --   HGB 16.7 16.7 15.6 13.5 13.7 12.8* 12.8*  HCT 46.6 46.5 44.4 38.6* 38.7* 37.6* 37.5*  PLT 158 139* 147* 137* 157 179  --   MCV 92.1 90.8 91.9 91.9 89.8 90.4  --   MCH 33.0 32.6 32.3 32.1 31.8 30.8  --   MCHC 35.8 35.9 35.1 35.0 35.4 34.0  --   RDW 14.0 14.2 14.2 14.0 13.7 14.0  --   LYMPHSABS 1.1  --   --   --   --   --   --   MONOABS 2.4*  --   --   --   --   --   --   EOSABS 0.0  --   --   --   --   --   --   BASOSABS 0.0  --   --   --   --   --   --     Chemistries   Recent Labs Lab 06/02/13 0541 06/03/13 0705 06/04/13 0610 06/05/13 0605 06/06/13 0500  NA 134* 136 135 137 140  K 3.4* 3.4* 3.1* 3.2* 3.2*  CL 99 101 101 102 105  CO2 22 25 24 25 24   GLUCOSE 122* 106* 117* 118* 106*  BUN 15 17 13 8 6   CREATININE 1.01 1.08 0.92 0.81 0.79  CALCIUM 8.2* 8.1* 8.5 8.2* 8.2*    CBG: No results found for this basename: GLUCAP,  in the last 168 hours  GFR Estimated Creatinine Clearance: 87.1 ml/min (by C-G formula based on Cr of 0.79).  Coagulation profile  Recent Labs Lab 06/02/13 0541 06/03/13 0705 06/04/13 0610 06/05/13 0605 06/06/13 0500  INR 5.73* 2.48* 1.68* 1.66* 2.33*    Cardiac Enzymes No results found for this basename: CK, CKMB, TROPONINI, MYOGLOBIN,   in the last 168 hours  No components found with this basename:  POCBNP,  No results found for this basename: DDIMER,  in the last 72 hours No results found for this basename: HGBA1C,  in the last 72 hours No results found for this basename: CHOL, HDL, LDLCALC, TRIG, CHOLHDL, LDLDIRECT,  in the last 72 hours No results found for this basename: TSH, T4TOTAL, FREET3, T3FREE, THYROIDAB,  in the last 72 hours No results found for this basename: VITAMINB12, FOLATE, FERRITIN, TIBC, IRON, RETICCTPCT,  in the last 72 hours No results found for this basename: LIPASE, AMYLASE,  in the last 72 hours  Urine Studies No results found for this basename: UACOL, UAPR, USPG, UPH, UTP, UGL, UKET, UBIL, UHGB, UNIT, UROB, ULEU, UEPI, UWBC, URBC, UBAC, CAST, CRYS, UCOM, BILUA,  in the last 72 hours  MICROBIOLOGY: Recent Results (from the past 240 hour(s))  CLOSTRIDIUM DIFFICILE BY PCR     Status: None   Collection Time    06/01/13 10:25 PM      Result Value Range Status   C difficile by pcr NEGATIVE  NEGATIVE Final  STOOL CULTURE     Status: None   Collection Time    06/01/13 10:25 PM      Result Value Range Status   Specimen Description STOOL   Final   Special Requests NONE   Final   Culture     Final   Value: NO SALMONELLA, SHIGELLA, CAMPYLOBACTER, YERSINIA, OR E.COLI 0157:H7 ISOLATED     Performed at [facility]   Report Status 06/05/2013 FINAL   Final  OVA AND PARASITE EXAMINATION     Status: None   Collection Time    06/02/13 12:22 PM      Result Value Range Status   Specimen Description PERIRECTAL   Final   Special Requests NONE   Final   Ova and parasites     Final   Value: NO OVA OR PARASITES SEEN     Performed at [facility]   Report Status 06/03/2013 FINAL   Final    RADIOLOGY STUDIES/RESULTS: Ct Abdomen Pelvis W Contrast  06/01/2013   CLINICAL DATA:  Left lower quadrant pain and tenderness. Nausea vomiting. Bloody diarrhea.  EXAM: CT ABDOMEN AND PELVIS WITH CONTRAST   TECHNIQUE: Multidetector CT imaging of the abdomen and pelvis was performed using the standard protocol following bolus administration of intravenous contrast.  CONTRAST:  OMNIPAQUE IOHEXOL 300 MG/ML  SOLN  COMPARISON:  08/23/2011  FINDINGS: There is moderate diffuse colonic wall thickening with sparing of terminal ileum and appendix. This is consistent with diffuse colitis, likely pseudomembranous colitis or other infectious etiology. There is no evidence of abscess, free fluid, or bowel obstruction.  Several hepatic and renal cysts remains stable. No masses or lymphadenopathy identified. Prior cholecystectomy noted. The pancreas, spleen, and adrenal glands are normal in appearance. No evidence of hydronephrosis. Right hip prosthesis results in beam hardening artifact through the inferior pelvis. Moderately enlarged prostate gland again noted, without change. An old L2 vertebral body compression fracture is unchanged.  IMPRESSION: Moderate diffuse colitis, likely due to C difficile or other infectious colitis. No evidence of abscess or other complication.  Stable moderately enlarged prostate and old L2 vertebral body compression fracture.   Electronically Signed   By: Myles Rosenthal   On: 06/01/2013 19:50    Jeoffrey Massed, MD  [facility] Pager:[phone]  If 7PM-7AM, please contact night-coverage [email or web address] Password [email or web address] 06/06/2013, 12:11 PM   LOS: 5 days

## 2013-06-06 NOTE — Progress Notes (Signed)
PHARMACY FOLLOW UP NOTE  Pharmacy Consult for : Coumadin Indication:  atrial fibrillation  Dosing Weight: 91 kg  Labs:  Recent Labs  06/04/13 0610 06/05/13 0605 06/06/13 0500  HGB 13.7 12.8* 12.8*  HCT 38.7* 37.6* 37.5*  PLT 157 179  --   LABPROT 19.3* 19.1* 24.8*  INR 1.68* 1.66* 2.33*  CREATININE 0.92 0.81 0.79   Lab Results  Component Value Date   INR 2.33* 06/06/2013   INR 1.66* 06/05/2013   INR 1.68* 06/04/2013    Medications:  Scheduled:  . atorvastatin  40 mg Oral q1800  . benzonatate  100 mg Oral Q8H  . carbamazepine  200 mg Oral BID  . ciprofloxacin  400 mg Intravenous BID  . latanoprost  1 drop Both Eyes QHS  . loratadine  10 mg Oral Daily  . metoprolol tartrate  25 mg Oral BID  . metronidazole  500 mg Intravenous Q8H  . polyethylene glycol  17 g Oral TID  . prednisoLONE acetate  1 drop Both Eyes Daily  . sodium chloride  3 mL Intravenous Q12H  . vitamin C  500 mg Oral BID  . Warfarin - Pharmacist Dosing Inpatient   Does not apply q1800   Anti-infectives   Start     Dose/Rate Route Frequency Ordered Stop   06/01/13 2330  metroNIDAZOLE (FLAGYL) IVPB 500 mg     500 mg 100 mL/hr over 60 Minutes Intravenous Every 8 hours 06/01/13 2238     06/01/13 2330  ciprofloxacin (CIPRO) IVPB 400 mg     400 mg 200 mL/hr over 60 Minutes Intravenous 2 times daily 06/01/13 2259     06/01/13 2015  ciprofloxacin (CIPRO) IVPB 400 mg  Status:  Discontinued     400 mg 200 mL/hr over 60 Minutes Intravenous  Once 06/01/13 2007 06/01/13 2009   06/01/13 2015  metroNIDAZOLE (FLAGYL) IVPB 500 mg  Status:  Discontinued     500 mg 100 mL/hr over 60 Minutes Intravenous  Once 06/01/13 2007 06/01/13 2009      Assessment:  79 y.o.male admitted for colitis and GIB.  His baseline INR was SUPRAtheraeutic and coumadin was originally held and restarted 9/12. INR today 2.33 (yesterday: 1.66). No further bleeding complications noted. Hgb stable today at 12.8.  Home warfarin dose listed on  PTA list as 7.5 mg daily   Goal of Therapy:  INR 2-3   Plan:  Coumadin 2.5 mg today. (Remaining on conservative side d/t recent bleeding and significant increase in INR.)  Daily INR.  Monitor for bleeding complications.    Inez Stantz C. Donnita Farina, PharmD Clinical Pharmacist-Resident Pager: [phone] Pharmacy: [phone] 06/06/2013 9:58 AM

## 2013-06-07 ENCOUNTER — Encounter (HOSPITAL_COMMUNITY): Payer: Self-pay | Admitting: Gastroenterology

## 2013-06-07 DIAGNOSIS — E785 Hyperlipidemia, unspecified: Secondary | ICD-10-CM

## 2013-06-07 LAB — BASIC METABOLIC PANEL
BUN: 4 mg/dL — ABNORMAL LOW (ref 6–23)
Chloride: 105 mEq/L (ref 96–112)
GFR calc Af Amer: >90 mL/min (ref >90)
GFR calc non Af Amer: 82 mL/min — ABNORMAL LOW (ref >90)
Potassium: 3.3 mEq/L — ABNORMAL LOW (ref 3.5–5.1)
Sodium: 140 mEq/L (ref 135–145)

## 2013-06-07 LAB — PROTIME-INR
INR: 2.43 — ABNORMAL HIGH (ref 0.00–1.49)
Prothrombin Time: 25.6 seconds — ABNORMAL HIGH (ref 11.6–15.2)

## 2013-06-07 MED ORDER — WARFARIN SODIUM 5 MG PO TABS: ORAL_TABLET | ORAL | Status: DC

## 2013-06-07 MED ORDER — METOPROLOL TARTRATE 25 MG PO TABS: 25.0000 mg | ORAL_TABLET | Freq: Two times a day (BID) | ORAL | Status: DC

## 2013-06-07 MED ORDER — WARFARIN SODIUM 2.5 MG PO TABS
2.5000 mg | ORAL_TABLET | Freq: Once | ORAL | Status: AC
  Administered 2013-06-07: 2.5 mg via ORAL
  Filled 2013-06-07: qty 1

## 2013-06-07 NOTE — Progress Notes (Signed)
Patient discharged to home. Patient AVS reviewed with patient and patient's son. Patient and son verbalized understanding of medications and follow-up appointments.  Patient remains stable; no signs or symptoms of distress.  Patient educated to return to the ER in cases of recurrence of admitting symptoms, SOB, dizziness, fever, chest pain, or fainting.

## 2013-06-07 NOTE — Discharge Summary (Signed)
PATIENT DETAILS Name: Eric [name] Age: 79 y.o. Sex: male Date of Birth: [birth year] MRN: [id number]. Admit Date: 06/01/2013 Admitting Physician: Eduard Clos, MD OZD:[id number] Homero Fellers, MD  Recommendations for Outpatient Follow-up:  1. Please check CBC, INR at next visit  PRIMARY DISCHARGE DIAGNOSIS:  Principal Problem:   Colitis Active Problems:   Atrial fibrillation   RECTAL BLEEDING   Coagulopathy      PAST MEDICAL HISTORY: Past Medical History  Diagnosis Date  . CAD (coronary artery disease)   . GERD (gastroesophageal reflux disease)   . Hyperlipidemia   . Hypertension   . Atrial fibrillation   . Trigeminal neuralgia   . CHF (congestive heart failure)   . NSAID-induced gastric ulcer   . Blood transfusion without reported diagnosis   . GI bleed     DISCHARGE MEDICATIONS:   Medication List         acetaminophen 325 MG tablet  Commonly known as:  TYLENOL  Take 650 mg by mouth every 4 (four) hours as needed. pain     atorvastatin 40 MG tablet  Commonly known as:  LIPITOR  TAKE 1 TABLET BY MOUTH EVERY NIGHT AT BEDTIME     benzonatate 100 MG capsule  Commonly known as:  TESSALON  Take 100 mg by mouth every 8 (eight) hours.     CALCIUM + D 600-200 MG-UNIT Tabs  Generic drug:  Calcium Carbonate-Vitamin D  Take 1 tablet by mouth daily.     carbamazepine 200 MG tablet  Commonly known as:  TEGRETOL  TAKE 1 TABLET BY MOUTH TWICE DAILY     celecoxib 200 MG capsule  Commonly known as:  CELEBREX  Take 200 mg by mouth 2 (two) times daily.     docusate sodium 100 MG capsule  Commonly known as:  COLACE  Take 100 mg by mouth 2 (two) times daily.     DUREZOL 0.05 % Emul  Generic drug:  Difluprednate  Place 1 drop into both eyes daily.     esomeprazole 40 MG capsule  Commonly known as:  NEXIUM  Take 40 mg by mouth daily.     fexofenadine 180 MG tablet  Commonly known as:  ALLEGRA  Take 180 mg by mouth daily.     LUMIGAN 0.01 % Soln   Generic drug:  bimatoprost  Place 1 drop into both eyes at bedtime. Both eyes at hour of sleep     metoprolol tartrate 25 MG tablet  Commonly known as:  LOPRESSOR  Take 1 tablet (25 mg total) by mouth 2 (two) times daily.     multivitamin per tablet  Take 1 tablet by mouth daily.     nitroGLYCERIN 0.4 MG SL tablet  Commonly known as:  NITROSTAT  Place 0.4 mg under the tongue every 5 (five) minutes as needed for chest pain.     PROVENTIL HFA 108 (90 BASE) MCG/ACT inhaler  Generic drug:  albuterol  - Inhale 2 puffs into the lungs every 4 (four) hours as needed. For shortness of breath  -      vitamin C 500 MG tablet  Commonly known as:  ASCORBIC ACID  Take 500 mg by mouth 2 (two) times daily.     warfarin 5 MG tablet  Commonly known as:  COUMADIN  Take 2.5 mg for 9/15 and 9/16 and then go back to usual regimen of 7.5mg  daily        ALLERGIES:   Allergies  Allergen Reactions  . Enoxaparin Sodium [Enoxaparin  Sodium] Anaphylaxis and Other (See Comments)    angioedema  . Codeine Other (See Comments)    hallucinations  . Oxycodone Hcl [Oxycodone Hcl] Other (See Comments)    hallucinations  . Vicodin [Hydrocodone-Acetaminophen] Other (See Comments)    hallucinations  . Sulfonamide Derivatives Rash    BRIEF HPI:  See H&P, Labs, Consult and Test reports for all details in brief,is a 79 y.o. male with history of atrial fibrillation on Coumadin presents with complaints of abdominal pain. In the ER patient had CAT scan which shows diffuse colitis. On exam patient also had some blood in the stools.  CONSULTATIONS:   GI  PERTINENT RADIOLOGIC STUDIES: Ct Abdomen Pelvis W Contrast  06/01/2013   CLINICAL DATA:  Left lower quadrant pain and tenderness. Nausea vomiting. Bloody diarrhea.  EXAM: CT ABDOMEN AND PELVIS WITH CONTRAST  TECHNIQUE: Multidetector CT imaging of the abdomen and pelvis was performed using the standard protocol following bolus administration of intravenous  contrast.  CONTRAST:  OMNIPAQUE IOHEXOL 300 MG/ML  SOLN  COMPARISON:  08/23/2011  FINDINGS: There is moderate diffuse colonic wall thickening with sparing of terminal ileum and appendix. This is consistent with diffuse colitis, likely pseudomembranous colitis or other infectious etiology. There is no evidence of abscess, free fluid, or bowel obstruction.  Several hepatic and renal cysts remains stable. No masses or lymphadenopathy identified. Prior cholecystectomy noted. The pancreas, spleen, and adrenal glands are normal in appearance. No evidence of hydronephrosis. Right hip prosthesis results in beam hardening artifact through the inferior pelvis. Moderately enlarged prostate gland again noted, without change. An old L2 vertebral body compression fracture is unchanged.  IMPRESSION: Moderate diffuse colitis, likely due to C difficile or other infectious colitis. No evidence of abscess or other complication.  Stable moderately enlarged prostate and old L2 vertebral body compression fracture.   Electronically Signed   By: Myles Rosenthal   On: 06/01/2013 19:50     PERTINENT LAB RESULTS: CBC:  Recent Labs  06/05/13 0605 06/06/13 0500  WBC 9.5  --   HGB 12.8* 12.8*  HCT 37.6* 37.5*  PLT 179  --    CMET CMP     Component Value Date/Time   NA 140 06/07/2013 0630   K 3.3* 06/07/2013 0630   CL 105 06/07/2013 0630   CO2 28 06/07/2013 0630   GLUCOSE 104* 06/07/2013 0630   BUN 4* 06/07/2013 0630   CREATININE 0.83 06/07/2013 0630   CALCIUM 8.2* 06/07/2013 0630   PROT 7.1 06/01/2013 1723   ALBUMIN 3.6 06/01/2013 1723   AST 25 06/01/2013 1723   ALT 14 06/01/2013 1723   ALKPHOS 69 06/01/2013 1723   BILITOT 0.4 06/01/2013 1723   GFRNONAA 82* 06/07/2013 0630   GFRAA >90 06/07/2013 0630    GFR Estimated Creatinine Clearance: 83.9 ml/min (by C-G formula based on Cr of 0.83). No results found for this basename: LIPASE, AMYLASE,  in the last 72 hours No results found for this basename: CKTOTAL, CKMB, CKMBINDEX,  TROPONINI,  in the last 72 hours No components found with this basename: POCBNP,  No results found for this basename: DDIMER,  in the last 72 hours No results found for this basename: HGBA1C,  in the last 72 hours No results found for this basename: CHOL, HDL, LDLCALC, TRIG, CHOLHDL, LDLDIRECT,  in the last 72 hours No results found for this basename: TSH, T4TOTAL, FREET3, T3FREE, THYROIDAB,  in the last 72 hours No results found for this basename: VITAMINB12, FOLATE, FERRITIN, TIBC, IRON, RETICCTPCT,  in the last 72 hours Coags:  Recent Labs  06/06/13 0500 06/07/13 0630  INR 2.33* 2.43*   Microbiology: Recent Results (from the past 240 hour(s))  CLOSTRIDIUM DIFFICILE BY PCR     Status: None   Collection Time    06/01/13 10:25 PM      Result Value Range Status   C difficile by pcr NEGATIVE  NEGATIVE Final  STOOL CULTURE     Status: None   Collection Time    06/01/13 10:25 PM      Result Value Range Status   Specimen Description STOOL   Final   Special Requests NONE   Final   Culture     Final   Value: NO SALMONELLA, SHIGELLA, CAMPYLOBACTER, YERSINIA, OR E.COLI 0157:H7 ISOLATED     Performed at [facility]   Report Status 06/05/2013 FINAL   Final  OVA AND PARASITE EXAMINATION     Status: None   Collection Time    06/02/13 12:22 PM      Result Value Range Status   Specimen Description PERIRECTAL   Final   Special Requests NONE   Final   Ova and parasites     Final   Value: NO OVA OR PARASITES SEEN     Performed at [facility]   Report Status 06/03/2013 FINAL   Final     BRIEF HOSPITAL COURSE:  Colitis  -Likely Ischemic Colitis -admitted with vomiting and bloody diarrhea-initially thought to be infectious, but - Sigmoidoscopy on 9/12-more suggestive of Ischemic. GI was consulted. Bx from sigmoidoscopy-showed-focal active colitis. - Diarrhea slowly imroved, by the day of discharge, it had almost resolved, with only one BM last night. Bow nore formed. No  further hematochezia noted for the last 2-3 days.  - Stool C. difficile PCR negative, Stool culture neg so far, ova parasites neg, and a GI pathogen panel PCR neg as well.  -Was maintained on Cipro and Flagyl since admission, today is day 7, spoke with GI-Dr Ellin Mayhew does not recommend further antibiotics, as this is likely ischemic colitis. He has cleared the patient for discharge as well.  Lower GI bleed  - In a setting of severe colitis and supratherapeutic INR-thankfully this seems to have resolved  - Hemoglobin remains stable, slowly trended down to 12.8  On 9/14. - One time dose of 2.5 mg Vitamin K given 9/10.  - please check hemoglobin and hematocrit at next visit.  Supratherapeutic INR -this was noted on admission, required 2.5 mg of Vit K orally. -Coumadin was dosed by pharmacy  Atrial fibrillation  - Rate initially not optimal, increased with activity- started metoprolol on 9/10-much better rate control  - Please see above regarding Coumadin-have asked patient to continue with Coumadin 2.5 mg for 9/15 and 9/16 and then to resume 7.5 mg daily.  -will continue with Metoprolol on discharge  History of trigeminal neuralgia  - Continue with Tegretol   Dyslipidemia  - Continue with statin   History of CVA  - Underlying A. fib, still on Coumadin- see above   History of glaucoma  - Stable   TODAY-DAY OF DISCHARGE:  Subjective:   Eric [name] today has no headache,no chest abdominal pain,no new weakness tingling or numbness, feels much better wants to go home today.   Objective:   Blood pressure 104/55, pulse 78, temperature 97.3 F (36.3 C), temperature source Oral, resp. rate 18, height 6\' 2"  (1.88 m), weight 91.5 kg (201 lb 11.5 oz), SpO2 97.00%.  Intake/Output Summary (  Last 24 hours) at 06/07/13 1420 Last data filed at 06/07/13 1300  Gross per 24 hour  Intake   1020 ml  Output      0 ml  Net   1020 ml   Filed Weights   06/05/13 0619 06/06/13 0558 06/07/13  0547  Weight: 89.585 kg (197 lb 8 oz) 90.992 kg (200 lb 9.6 oz) 91.5 kg (201 lb 11.5 oz)    Exam Awake Alert, Oriented *3, No new F.N deficits, Normal affect [place].AT,PERRAL Supple Neck,No JVD, No cervical lymphadenopathy appriciated.  Symmetrical Chest wall movement, Good air movement bilaterally, CTAB RRR,No Gallops,Rubs or new Murmurs, No Parasternal Heave +ve B.Sounds, Abd Soft, Non tender, No organomegaly appriciated, No rebound -guarding or rigidity. No Cyanosis, Clubbing or edema, No new Rash or bruise  DISCHARGE CONDITION: Stable  DISPOSITION: Home  DISCHARGE INSTRUCTIONS:    Activity:  As tolerated with Full fall precautions use walker/cane & assistance as needed  Diet recommendation: Heart Healthy diet      Discharge Orders   Future Appointments Provider Department Dept Phone   06/10/2013 2:00 PM [name], MD [facility] [place] [phone]   Future Orders Complete By Expires   Call MD for:  extreme fatigue  As directed    Call MD for:  persistant nausea and vomiting  As directed    Call MD for:  severe uncontrolled pain  As directed    Diet - low sodium heart healthy  As directed    Scheduling Instructions:     Soft mechanical diet for 1 more week   Increase activity slowly  As directed       Follow-up Information   Follow up with Rogelia Boga, MD. Schedule an appointment as soon as possible for a visit in 2 weeks.   Specialty:  Internal Medicine   Contact information:   [address] [place] [address] [phone]       Follow up with Samuel Germany L, MD. Schedule an appointment as soon as possible for a visit in 2 weeks.   Specialty:  Gastroenterology   Contact information:   [address]                         [place] [address] [phone]         Total Time spent on discharge equals 45 minutes.  SignedJeoffrey Massed 06/07/2013 2:20 PM

## 2013-06-07 NOTE — Progress Notes (Signed)
PHARMACY FOLLOW UP NOTE  Pharmacy Consult for : Coumadin Indication:  atrial fibrillation  Dosing Weight: 91 kg  Labs:  Recent Labs  06/05/13 0605 06/06/13 0500 06/07/13 0630  HGB 12.8* 12.8*  --   HCT 37.6* 37.5*  --   PLT 179  --   --   LABPROT 19.1* 24.8* 25.6*  INR 1.66* 2.33* 2.43*  CREATININE 0.81 0.79 0.83   Lab Results  Component Value Date   INR 2.43* 06/07/2013   INR 2.33* 06/06/2013   INR 1.66* 06/05/2013    Medications:  Scheduled:  . atorvastatin  40 mg Oral q1800  . benzonatate  100 mg Oral Q8H  . carbamazepine  200 mg Oral BID  . ciprofloxacin  400 mg Intravenous BID  . latanoprost  1 drop Both Eyes QHS  . loratadine  10 mg Oral Daily  . metoprolol tartrate  25 mg Oral BID  . metronidazole  500 mg Intravenous Q8H  . polyethylene glycol  17 g Oral TID  . prednisoLONE acetate  1 drop Both Eyes Daily  . sodium chloride  3 mL Intravenous Q12H  . vitamin C  500 mg Oral BID  . Warfarin - Pharmacist Dosing Inpatient   Does not apply q1800   Anti-infectives   Start     Dose/Rate Route Frequency Ordered Stop   06/01/13 2330  metroNIDAZOLE (FLAGYL) IVPB 500 mg     500 mg 100 mL/hr over 60 Minutes Intravenous Every 8 hours 06/01/13 2238     06/01/13 2330  ciprofloxacin (CIPRO) IVPB 400 mg     400 mg 200 mL/hr over 60 Minutes Intravenous 2 times daily 06/01/13 2259     06/01/13 2015  ciprofloxacin (CIPRO) IVPB 400 mg  Status:  Discontinued     400 mg 200 mL/hr over 60 Minutes Intravenous  Once 06/01/13 2007 06/01/13 2009   06/01/13 2015  metroNIDAZOLE (FLAGYL) IVPB 500 mg  Status:  Discontinued     500 mg 100 mL/hr over 60 Minutes Intravenous  Once 06/01/13 2007 06/01/13 2009      Assessment:  79 y.o.male admitted for colitis and GIB.  Patient on coumadin PTA for atrial fibrillation. His baseline INR was SUPRAtheraeutic (=5.73) on 7.5 mg daily. Coumadin was originally held,  Vitamin K 2.5 mg po given on 9/10 then couamdin was restarted 9/12. INR today  2.43, 2.33 yesterday and 1.66 on 06/05/13. No further bleeding complications noted. Hgb stable yesterday at 12.8. PLTC 179K on 06/05/13.  Currently receiving IV cipro and metronidazole which may increase coumadin effect.   Home warfarin dose listed on PTA list as 7.5 mg daily   Goal of Therapy:  INR 2-3   Plan:  Coumadin 2.5 mg today. Remaining on conservative side d/t recent bleeding, supratherapeutic INR on admit, possible drug interactions and significant increase in INR from 9/13 to 9/14.   Daily INR.  Monitor for bleeding complications.    Noah Delaine, RPh Clinical Pharmacist Pager: [phone] 06/07/2013 10:56 AM

## 2013-06-07 NOTE — Progress Notes (Signed)
Physical Therapy Treatment Patient Details Name: Eric [name] MRN: [id number] DOB: May 12, 1931 Today's Date: 06/07/2013 Time: 4098-1191 PT Time Calculation (min): 27 min  PT Assessment / Plan / Recommendation  History of Present Illness Pt adm with GI bleed and colitis.   PT Comments   Patient progressing with ambulation. Still continue to recommend supervision for ambulation as patient with some safety issues. Anticipate DC later today. Continue with current POC  Follow Up Recommendations  No PT follow up     Does the patient have the potential to tolerate intense rehabilitation     Barriers to Discharge        Equipment Recommendations  None recommended by PT    Recommendations for Other Services    Frequency Min 3X/week   Progress towards PT Goals Progress towards PT goals: Progressing toward goals  Plan Current plan remains appropriate    Precautions / Restrictions Precautions Precautions: None   Pertinent Vitals/Pain no apparent distress     Mobility  Bed Mobility Supine to Sit: 6: Modified independent (Device/Increase time);HOB elevated Transfers Sit to Stand: 6: Modified independent (Device/Increase time);With upper extremity assist;From bed;From toilet Stand to Sit: 6: Modified independent (Device/Increase time);With upper extremity assist;To toilet Ambulation/Gait Ambulation/Gait Assistance: 5: Supervision Ambulation Distance (Feet): 300 Feet Assistive device: Rolling walker;None Gait Pattern: Step-through pattern;Decreased stride length General Gait Details: Incr reliance on walker as distance incr.    Exercises     PT Diagnosis:    PT Problem List:   PT Treatment Interventions:     PT Goals (current goals can now be found in the care plan section)    Visit Information  Last PT Received On: 06/07/13 Assistance Needed: +1 History of Present Illness: Pt adm with GI bleed and colitis.    Subjective Data      Cognition   Cognition Arousal/Alertness: Awake/alert Behavior During Therapy: WFL for tasks assessed/performed Overall Cognitive Status: Within Functional Limits for tasks assessed    Balance  Static Standing Balance Static Standing - Balance Support: No upper extremity supported;During functional activity Static Standing - Level of Assistance: 6: Modified independent (Device/Increase time)  End of Session PT - End of Session Equipment Utilized During Treatment: Gait belt Activity Tolerance: Patient tolerated treatment well Patient left: in chair;with call bell/phone within reach   GP     Fredrich Birks 06/07/2013, 12:55 PM  06/07/2013 Fredrich Birks PTA [phone] pager [phone] office

## 2013-06-08 ENCOUNTER — Telehealth: Payer: Self-pay | Admitting: *Deleted

## 2013-06-08 NOTE — Telephone Encounter (Signed)
Transitional care  Admit date 06/01/2013 Discharge date:06/07/2013  Primary discharge diagnosis: Principal problem:   colitis Active problems:    Atrial fibrillation    Rectal bleeding    Coagulopathy Recommendations for outpatient follow-up: 1. Please check cbc,inr at next vsit  Talked with patient and he states he is feeling much better and is able to eat without abd pain.  States he is ambulating around the house without difficulty.  He states he is compliant with his medication and a new medication metoprolol 25 bid was started while in the hospital. Patient is having no more rectal bleeding.  Patient had an appointment on 06/09/2013 but he cancelled and rescheduled to October 1.  His wife stated that he had an appointment with dr Ramon Dredge on that day and couldn't afford but one trip to [place] from [place].

## 2013-06-10 ENCOUNTER — Ambulatory Visit: Payer: Medicare PPO | Admitting: Internal Medicine

## 2013-06-10 ENCOUNTER — Ambulatory Visit (INDEPENDENT_AMBULATORY_CARE_PROVIDER_SITE_OTHER): Payer: Medicare PPO | Admitting: General Practice

## 2013-06-10 DIAGNOSIS — I4891 Unspecified atrial fibrillation: Secondary | ICD-10-CM

## 2013-06-10 LAB — PROTIME-INR: INR: 2.4 — AB (ref <=1.1)

## 2013-06-17 ENCOUNTER — Telehealth: Payer: Self-pay | Admitting: Internal Medicine

## 2013-06-17 NOTE — Telephone Encounter (Signed)
Please advise 

## 2013-06-17 NOTE — Telephone Encounter (Signed)
If patient is unsure of his driving safely, suggest he contact the [facility]  for an evaluation

## 2013-06-17 NOTE — Telephone Encounter (Signed)
Pt would like to know if its ok if he drives. He needs to get around!!

## 2013-06-18 NOTE — Telephone Encounter (Signed)
Spoke to pt told him if he is unsure about your driving, Dr. Kirtland Bouchard suggest contact [employer] for an evaluation. Pt stated he is fine was told not to drive for 2 weeks by another provider. Told him okay.

## 2013-06-21 ENCOUNTER — Emergency Department (HOSPITAL_COMMUNITY): Payer: Medicare PPO

## 2013-06-21 ENCOUNTER — Encounter (HOSPITAL_COMMUNITY): Payer: Self-pay | Admitting: Adult Health

## 2013-06-21 DIAGNOSIS — Z79899 Other long term (current) drug therapy: Secondary | ICD-10-CM | POA: Insufficient documentation

## 2013-06-21 DIAGNOSIS — Z87891 Personal history of nicotine dependence: Secondary | ICD-10-CM | POA: Insufficient documentation

## 2013-06-21 DIAGNOSIS — I10 Essential (primary) hypertension: Secondary | ICD-10-CM | POA: Insufficient documentation

## 2013-06-21 DIAGNOSIS — I509 Heart failure, unspecified: Secondary | ICD-10-CM | POA: Insufficient documentation

## 2013-06-21 DIAGNOSIS — E785 Hyperlipidemia, unspecified: Secondary | ICD-10-CM | POA: Insufficient documentation

## 2013-06-21 DIAGNOSIS — K5900 Constipation, unspecified: Secondary | ICD-10-CM | POA: Insufficient documentation

## 2013-06-21 DIAGNOSIS — K6289 Other specified diseases of anus and rectum: Secondary | ICD-10-CM | POA: Insufficient documentation

## 2013-06-21 DIAGNOSIS — Z791 Long term (current) use of non-steroidal anti-inflammatories (NSAID): Secondary | ICD-10-CM | POA: Insufficient documentation

## 2013-06-21 DIAGNOSIS — Z8669 Personal history of other diseases of the nervous system and sense organs: Secondary | ICD-10-CM | POA: Insufficient documentation

## 2013-06-21 DIAGNOSIS — Z7901 Long term (current) use of anticoagulants: Secondary | ICD-10-CM | POA: Insufficient documentation

## 2013-06-21 DIAGNOSIS — Z9089 Acquired absence of other organs: Secondary | ICD-10-CM | POA: Insufficient documentation

## 2013-06-21 DIAGNOSIS — I4891 Unspecified atrial fibrillation: Secondary | ICD-10-CM | POA: Insufficient documentation

## 2013-06-21 DIAGNOSIS — K219 Gastro-esophageal reflux disease without esophagitis: Secondary | ICD-10-CM | POA: Insufficient documentation

## 2013-06-21 DIAGNOSIS — I2510 Atherosclerotic heart disease of native coronary artery without angina pectoris: Secondary | ICD-10-CM | POA: Insufficient documentation

## 2013-06-21 DIAGNOSIS — R300 Dysuria: Secondary | ICD-10-CM | POA: Insufficient documentation

## 2013-06-21 LAB — COMPREHENSIVE METABOLIC PANEL
ALT: 15 U/L (ref 0–53)
AST: 24 U/L (ref 0–37)
Albumin: 3.6 g/dL (ref 3.5–5.2)
Alkaline Phosphatase: 83 U/L (ref 39–117)
BUN: 11 mg/dL (ref 6–23)
Chloride: 102 mEq/L (ref 96–112)
GFR calc Af Amer: >90 mL/min (ref >90)
Potassium: 4.4 mEq/L (ref 3.5–5.1)
Sodium: 140 mEq/L (ref 135–145)
Total Bilirubin: 0.6 mg/dL (ref 0.3–1.2)
Total Protein: 6.6 g/dL (ref 6.0–8.3)

## 2013-06-21 LAB — CBC WITH DIFFERENTIAL/PLATELET
Basophils Absolute: 0 10*3/uL (ref 0.0–0.1)
Basophils Relative: 0 % (ref 0–1)
Eosinophils Absolute: 0.1 10*3/uL (ref 0.0–0.7)
HCT: 39 % (ref 39.0–52.0)
Hemoglobin: 13.2 g/dL (ref 13.0–17.0)
MCH: 31.2 pg (ref 26.0–34.0)
MCHC: 33.8 g/dL (ref 30.0–36.0)
Monocytes Relative: 11 % (ref 3–12)
Neutro Abs: 7.6 10*3/uL (ref 1.7–7.7)
Neutrophils Relative %: 75 % (ref 43–77)
Platelets: 187 10*3/uL (ref 150–400)
RDW: 14 % (ref 11.5–15.5)

## 2013-06-21 NOTE — ED Notes (Addendum)
Presents with lower abdominal pain that began yesterday. Unable to have BM for "quite a few days. It feelsl ike I can't move my bowels. I believe my belly is a little bigger than normal too" denies pain with urination, denies nausea. Abdomen soft, nontender to palpation. Last BM Saturday. Hard stool.  Pain does not radiate. Pt also c/o rectal pain.

## 2013-06-22 ENCOUNTER — Emergency Department (HOSPITAL_COMMUNITY)
Admission: EM | Admit: 2013-06-22 | Discharge: 2013-06-22 | Disposition: A | Discharge status: Home / Self Care | Payer: Medicare PPO | Attending: Emergency Medicine | Admitting: Emergency Medicine

## 2013-06-22 ENCOUNTER — Other Ambulatory Visit: Payer: Self-pay | Admitting: Internal Medicine

## 2013-06-22 ENCOUNTER — Telehealth: Payer: Self-pay | Admitting: Internal Medicine

## 2013-06-22 DIAGNOSIS — K5900 Constipation, unspecified: Secondary | ICD-10-CM

## 2013-06-22 LAB — URINALYSIS, ROUTINE W REFLEX MICROSCOPIC
Bilirubin Urine: NEGATIVE
Glucose, UA: NEGATIVE mg/dL
Hgb urine dipstick: NEGATIVE
Leukocytes, UA: NEGATIVE
Nitrite: NEGATIVE
Protein, ur: NEGATIVE mg/dL
Specific Gravity, Urine: 1.02 (ref 1.005–1.030)
Urobilinogen, UA: 0.2 mg/dL (ref 0.0–1.0)
pH: 6 (ref 5.0–8.0)

## 2013-06-22 LAB — COMPREHENSIVE METABOLIC PANEL
ALT: 15 U/L (ref 0–53)
Albumin: 3.7 g/dL (ref 3.5–5.2)
BUN: 11 mg/dL (ref 6–23)
CO2: 27 mEq/L (ref 19–32)
Calcium: 9 mg/dL (ref 8.4–10.5)
GFR calc Af Amer: >90 mL/min (ref >90)
GFR calc non Af Amer: 80 mL/min — ABNORMAL LOW (ref >90)
Glucose, Bld: 89 mg/dL (ref 70–99)
Sodium: 134 mEq/L — ABNORMAL LOW (ref 135–145)
Total Protein: 6.8 g/dL (ref 6.0–8.3)

## 2013-06-22 LAB — LIPASE, BLOOD: Lipase: 23 U/L (ref 11–59)

## 2013-06-22 LAB — OCCULT BLOOD, POC DEVICE: Fecal Occult Bld: NEGATIVE

## 2013-06-22 LAB — PROTIME-INR: INR: 2.76 — ABNORMAL HIGH (ref 0.00–1.49)

## 2013-06-22 MED ORDER — POLYETHYLENE GLYCOL 3350 17 GM/SCOOP PO POWD: 17.0000 g | Freq: Every day | ORAL | Status: DC

## 2013-06-22 MED ORDER — FLEET ENEMA 7-19 GM/118ML RE ENEM
1.0000 | ENEMA | Freq: Once | RECTAL | Status: AC
  Administered 2013-06-22: 1 via RECTAL
  Filled 2013-06-22: qty 1

## 2013-06-22 NOTE — ED Notes (Signed)
Bladder Scan performed by NT - 174 ml .

## 2013-06-22 NOTE — Telephone Encounter (Signed)
[facility] Triage Call Report Triage Record Num: [id number] Operator: Geanie Berlin Patient Name: Eric [name] Call Date & Time: 06/21/2013 5:32:08PM Patient Phone: [phone] PCP: [name] Patient Gender: Male PCP Fax : [phone] Patient DOB: 01/29/1931 [facility] Name: [facility] Reason for Call: Caller: [name]; PCP: [name] (Family Practice > 85yrs old); CB#: [phone]; Call regarding Constipation; Onset: 06/19/13. Afebrile. Last BM 06/19/13. Reports rectal pain when tries to have BM and low abdominal pain rated 5/10. Advised to see [facility] now for pain described as deep per Abdominal Pain. Protocol(s) Used: Abdominal Pain Protocol(s) Used: Constipation Recommended Outcome per Protocol: See ED Immediately Reason for Outcome: Constant abdominal pain or abdominal pain present for more than 3-4 hours Pain described as deep, boring, or tearing Care Advice: ~ Another adult should drive. ~ Do not give the patient anything to eat or drink. ~ Do not push on abdomen. ~ IMMEDIATE ACTION Write down provider's name. List or place the following in a bag for transport with the patient: current prescription and/or nonprescription medications; alternative treatments, therapies and medications; and street drugs. ~ 09/

## 2013-06-22 NOTE — ED Notes (Signed)
Dr. Dierdre Highman at bedside evaluating pt.

## 2013-06-22 NOTE — ED Notes (Signed)
Unable to give urine specimen at this time , EDP notified , EDP stated to perform bladder scan then will attempt to collect urine specimen after procedure.

## 2013-06-22 NOTE — ED Provider Notes (Signed)
CSN: [id number]     Arrival date & time 06/21/13  2046 History   First MD Initiated Contact with Patient 06/22/13 0141     Chief Complaint  Patient presents with  . Abdominal Pain   (Consider location/radiation/quality/duration/timing/severity/associated sxs/prior Treatment) HPI History provided by patient. History of A. fib on Coumadin admitted for rectal bleeding about 3 weeks ago. Since that time he has been discharged home. For the last 3 days has been having constipation with some rectal discomfort. No further GI bleeding. No black or tarry stools. Tonight his constipation and is complicated by difficulty urinating. He last made urine about 6 hours prior to arrival. He has no history of urinary retention. No fevers or chills. He does complain of some midline suprapubic discomfort and fullness. Symptoms moderate in severity. No history of same. Past Medical History  Diagnosis Date  . CAD (coronary artery disease)   . GERD (gastroesophageal reflux disease)   . Hyperlipidemia   . Hypertension   . Atrial fibrillation   . Trigeminal neuralgia   . CHF (congestive heart failure)   . NSAID-induced gastric ulcer   . Blood transfusion without reported diagnosis   . GI bleed    Past Surgical History  Procedure Laterality Date  . Total knee arthroplasty  June 2009    Left  . Shoulder surgery      Left  . Cholecystectomy    . Hip surgery  August 2008    Right total hip replacement  . Esophagogastroduodenoscopy  2011  . Flexible sigmoidoscopy N/A 06/04/2013    Procedure: FLEXIBLE SIGMOIDOSCOPY;  Surgeon: Vertell Novak., MD;  Location: [facility] ENDOSCOPY;  Service: Endoscopy;  Laterality: N/A;   History reviewed. No pertinent family history. History  Substance Use Topics  . Smoking status: Former Smoker    Quit date: 09/23/1958  . Smokeless tobacco: Former Neurosurgeon     Comment: quit smoking1980-quit chewing 6 months  . Alcohol Use: No    Review of Systems  Constitutional: Negative for  fever and chills.  HENT: Negative for neck pain and neck stiffness.   Eyes: Negative for pain.  Respiratory: Negative for shortness of breath.   Cardiovascular: Negative for chest pain.  Gastrointestinal: Positive for constipation and rectal pain. Negative for blood in stool.  Genitourinary: Negative for dysuria.  Musculoskeletal: Negative for back pain.  Skin: Negative for rash.  Neurological: Negative for headaches.  All other systems reviewed and are negative.    Allergies  Enoxaparin sodium; Codeine; Oxycodone hcl; Vicodin; and Sulfonamide derivatives  Home Medications   Current Outpatient Rx  Name  Route  Sig  Dispense  Refill  . acetaminophen (TYLENOL) 325 MG tablet   Oral   Take 650 mg by mouth every 4 (four) hours as needed. pain         . albuterol (PROVENTIL HFA) 108 (90 BASE) MCG/ACT inhaler   Inhalation   Inhale 2 puffs into the lungs every 4 (four) hours as needed. For shortness of breath          . Ascorbic Acid (VITAMIN C) 500 MG tablet   Oral   Take 500 mg by mouth 2 (two) times daily.          Marland Kitchen atorvastatin (LIPITOR) 40 MG tablet   Oral   Take 40 mg by mouth at bedtime.         . benzonatate (TESSALON) 100 MG capsule   Oral   Take 100 mg by mouth every 8 (eight) hours.          Marland Kitchen  Bimatoprost (LUMIGAN) 0.01 % SOLN   Both Eyes   Place 1 drop into both eyes at bedtime. Both eyes at hour of sleep         . Calcium Carbonate-Vitamin D (CALCIUM + D) 600-200 MG-UNIT per tablet   Oral   Take 1 tablet by mouth daily.          . carbamazepine (TEGRETOL) 200 MG tablet   Oral   Take 200 mg by mouth 2 (two) times daily.         . celecoxib (CELEBREX) 200 MG capsule   Oral   Take 200 mg by mouth 2 (two) times daily.         . Difluprednate (DUREZOL) 0.05 % EMUL   Both Eyes   Place 1 drop into both eyes daily.          Marland Kitchen docusate sodium (COLACE) 100 MG capsule   Oral   Take 100 mg by mouth 2 (two) times daily.          Marland Kitchen  esomeprazole (NEXIUM) 40 MG capsule   Oral   Take 40 mg by mouth daily.          . fexofenadine (ALLEGRA) 180 MG tablet   Oral   Take 180 mg by mouth daily.          . metoprolol tartrate (LOPRESSOR) 25 MG tablet   Oral   Take 1 tablet (25 mg total) by mouth 2 (two) times daily.   60 tablet   0   . multivitamin (THERAGRAN) per tablet   Oral   Take 1 tablet by mouth daily.          . nitroGLYCERIN (NITROSTAT) 0.4 MG SL tablet   Sublingual   Place 0.4 mg under the tongue every 5 (five) minutes as needed for chest pain.         Marland Kitchen warfarin (COUMADIN) 7.5 MG tablet   Oral   Take 7.5 mg by mouth daily.          BP 103/62  Pulse 78  Temp(Src) 99 F (37.2 C) (Oral)  Resp 14  Ht 6\' 2"  (1.88 m)  Wt 198 lb (89.812 kg)  BMI 25.41 kg/m2  SpO2 98% Physical Exam  Constitutional: He is oriented to person, place, and time. He appears well-developed and well-nourished.  HENT:  Head: Normocephalic and atraumatic.  Eyes: EOM are normal. Pupils are equal, round, and reactive to light.  Neck: Neck supple.  Cardiovascular: Normal rate, regular rhythm and intact distal pulses.   Pulmonary/Chest: Effort normal and breath sounds normal. No respiratory distress.  Abdominal: Soft. Bowel sounds are normal. He exhibits no distension. There is no rebound and no guarding.  Mild suprapubic fullness and discomfort. No abdominal tenderness otherwise. No CVA tenderness  Genitourinary:  Rectal exam: Brown hard stool, nontender  Musculoskeletal: Normal range of motion. He exhibits no edema.  Neurological: He is alert and oriented to person, place, and time.  Skin: Skin is warm and dry.    ED Course  Procedures (including critical care time) Labs Review Labs Reviewed  CBC WITH DIFFERENTIAL - Abnormal; Notable for the following:    Monocytes Absolute 1.2 (*)    All other components within normal limits  COMPREHENSIVE METABOLIC PANEL - Abnormal; Notable for the following:    Glucose,  Bld 103 (*)    GFR calc non Af Amer 82 (*)    All other components within normal limits  PROTIME-INR - Abnormal; Notable for  the following:    Prothrombin Time 28.2 (*)    INR 2.76 (*)    All other components within normal limits  URINALYSIS, ROUTINE W REFLEX MICROSCOPIC  COMPREHENSIVE METABOLIC PANEL  LIPASE, BLOOD  OCCULT BLOOD, POC DEVICE   Imaging Review Dg Abd Acute W/chest  06/21/2013   CLINICAL DATA:  Constipation, abdominal pain.  EXAM: ACUTE ABDOMEN SERIES (ABDOMEN 2 VIEW & CHEST 1 VIEW)  COMPARISON:  CHEST x-ray 08/23/2011  FINDINGS: Heart is normal size. Lungs are clear. No effusions.  Prior cholecystectomy. No evidence of bowel obstruction. No free air organomegaly. No acute bony abnormality. Prior right hip replacement.  Metallic foreign body projects over the right lower quadrant. Exact location is unknown. This was not present on prior CT from 2012.  IMPRESSION: No evidence of bowel obstruction or free air.  No acute cardiopulmonary disease.   Electronically Signed   By: Charlett Nose M.D.   On: 06/21/2013 22:27   Fleet enema provided and patient had large bowel movement. Bladder scan shows about 180 cc of urine in the bladder. After bowel movement able to make urine -200 cc. No UTI. Symptomatically much improved. Repeat abdominal exam benign.  Patient has scheduled followup with GI and primary care physician in 2 days. He will start MiraLAX and continue this as long as he is taking pain medications. He will also discuss with GI.  Return cautions provided. Stable for discharge home at this time.  MDM  Diagnoses: Constipation Improved with enema and bowel movement. Also had some urinary retention resolved after bowel movement Labs reviewed as above Condition improved Vital signs and nurses notes reviewed    Sunnie Nielsen, MD 06/22/13 0501

## 2013-06-23 ENCOUNTER — Ambulatory Visit (INDEPENDENT_AMBULATORY_CARE_PROVIDER_SITE_OTHER): Payer: Medicare PPO | Admitting: Internal Medicine

## 2013-06-23 ENCOUNTER — Encounter: Payer: Self-pay | Admitting: Internal Medicine

## 2013-06-23 VITALS — BP 110/60 | HR 75 | Temp 97.6°F | Resp 20 | Wt 200.0 lb

## 2013-06-23 DIAGNOSIS — I10 Essential (primary) hypertension: Secondary | ICD-10-CM

## 2013-06-23 DIAGNOSIS — D689 Coagulation defect, unspecified: Secondary | ICD-10-CM

## 2013-06-23 DIAGNOSIS — I4891 Unspecified atrial fibrillation: Secondary | ICD-10-CM

## 2013-06-23 DIAGNOSIS — I2510 Atherosclerotic heart disease of native coronary artery without angina pectoris: Secondary | ICD-10-CM

## 2013-06-23 DIAGNOSIS — K625 Hemorrhage of anus and rectum: Secondary | ICD-10-CM

## 2013-06-23 DIAGNOSIS — D649 Anemia, unspecified: Secondary | ICD-10-CM

## 2013-06-23 DIAGNOSIS — E785 Hyperlipidemia, unspecified: Secondary | ICD-10-CM

## 2013-06-23 DIAGNOSIS — K529 Noninfective gastroenteritis and colitis, unspecified: Secondary | ICD-10-CM

## 2013-06-23 LAB — CBC WITH DIFFERENTIAL/PLATELET
Basophils Absolute: 0 10*3/uL (ref 0.0–0.1)
Basophils Relative: 0.3 % (ref 0.0–3.0)
Eosinophils Absolute: 0.2 10*3/uL (ref 0.0–0.7)
Hemoglobin: 12.7 g/dL — ABNORMAL LOW (ref 13.0–17.0)
Lymphs Abs: 1.7 10*3/uL (ref 0.7–4.0)
MCHC: 33.4 g/dL (ref 30.0–36.0)
MCV: 93.6 fl (ref 78.0–100.0)
Monocytes Absolute: 1.1 10*3/uL — ABNORMAL HIGH (ref 0.1–1.0)
Monocytes Relative: 14.3 % — ABNORMAL HIGH (ref 3.0–12.0)
Neutro Abs: 4.5 10*3/uL (ref 1.4–7.7)
Platelets: 167 10*3/uL (ref 150.0–400.0)
RBC: 4.07 Mil/uL — ABNORMAL LOW (ref 4.22–5.81)
RDW: 14.4 % (ref 11.5–14.6)
WBC: 7.4 10*3/uL (ref 4.5–10.5)

## 2013-06-23 NOTE — Patient Instructions (Signed)
Limit your sodium (Salt) intake   

## 2013-06-24 ENCOUNTER — Encounter: Payer: Self-pay | Admitting: Internal Medicine

## 2013-06-24 NOTE — Telephone Encounter (Signed)
Pt needs refill on carbamazepine 200mg . Pt is out [facility] [employer]

## 2013-06-24 NOTE — Progress Notes (Signed)
Subjective:    Patient ID: Eric [name], male    DOB: 02-15-1931, 79 y.o.   MRN: [id number]  HPI 79 year old patient who is seen today post hospital followup.    Hospital records reviewed  He was better for evaluation of abdominal pain. CT abdominal scan revealed diffuse colitis and stool is hematest positive. He was seen by GI as an inpatient and did have followed GI evaluation earlier today Patient denies any abdominal pain or change in his bowel habits. He denies any rectal bleeding  He has a history of atrial fibrillation and remains on Coumadin anticoagulation. He has coronary artery disease and history of congestive heart failure. In general doing well today.  Past Medical History  Diagnosis Date  . CAD (coronary artery disease)   . GERD (gastroesophageal reflux disease)   . Hyperlipidemia   . Hypertension   . Atrial fibrillation   . Trigeminal neuralgia   . CHF (congestive heart failure)   . NSAID-induced gastric ulcer   . Blood transfusion without reported diagnosis   . GI bleed     History   Social History  . Marital Status: Married    Spouse Name: N/A    Number of Children: N/A  . Years of Education: N/A   Occupational History  . Not on file.   Social History Main Topics  . Smoking status: Former Smoker    Quit date: 09/23/1958  . Smokeless tobacco: Former Neurosurgeon     Comment: quit smoking1980-quit chewing 6 months  . Alcohol Use: No  . Drug Use: No  . Sexual Activity: Not on file   Other Topics Concern  . Not on file   Social History Narrative  . No narrative on file    Past Surgical History  Procedure Laterality Date  . Total knee arthroplasty  June 2009    Left  . Shoulder surgery      Left  . Cholecystectomy    . Hip surgery  August 2008    Right total hip replacement  . Esophagogastroduodenoscopy  2011  . Flexible sigmoidoscopy N/A 06/04/2013    Procedure: FLEXIBLE SIGMOIDOSCOPY;  Surgeon: Vertell Novak., MD;  Location: [facility]  ENDOSCOPY;  Service: Endoscopy;  Laterality: N/A;    History reviewed. No pertinent family history.  Allergies  Allergen Reactions  . Enoxaparin Sodium [Enoxaparin Sodium] Anaphylaxis and Other (See Comments)    angioedema  . Codeine Other (See Comments)    hallucinations  . Oxycodone Hcl [Oxycodone Hcl] Other (See Comments)    hallucinations  . Vicodin [Hydrocodone-Acetaminophen] Other (See Comments)    hallucinations  . Sulfonamide Derivatives Rash    Current Outpatient Prescriptions on File Prior to Visit  Medication Sig Dispense Refill  . acetaminophen (TYLENOL) 325 MG tablet Take 650 mg by mouth every 4 (four) hours as needed. pain      . albuterol (PROVENTIL HFA) 108 (90 BASE) MCG/ACT inhaler Inhale 2 puffs into the lungs every 4 (four) hours as needed. For shortness of breath       . Ascorbic Acid (VITAMIN C) 500 MG tablet Take 500 mg by mouth 2 (two) times daily.       Marland Kitchen atorvastatin (LIPITOR) 40 MG tablet Take 40 mg by mouth at bedtime.      . benzonatate (TESSALON) 100 MG capsule Take 100 mg by mouth every 8 (eight) hours.       . Bimatoprost (LUMIGAN) 0.01 % SOLN Place 1 drop into both eyes at bedtime. Both eyes  at hour of sleep      . Calcium Carbonate-Vitamin D (CALCIUM + D) 600-200 MG-UNIT per tablet Take 1 tablet by mouth daily.       . carbamazepine (TEGRETOL) 200 MG tablet Take 200 mg by mouth 2 (two) times daily.      . celecoxib (CELEBREX) 200 MG capsule Take 200 mg by mouth 2 (two) times daily.      . Difluprednate (DUREZOL) 0.05 % EMUL Place 1 drop into both eyes daily.       Marland Kitchen docusate sodium (COLACE) 100 MG capsule Take 100 mg by mouth 2 (two) times daily.       Marland Kitchen esomeprazole (NEXIUM) 40 MG capsule Take 40 mg by mouth daily.       . fexofenadine (ALLEGRA) 180 MG tablet Take 180 mg by mouth daily.       . metoprolol tartrate (LOPRESSOR) 25 MG tablet Take 1 tablet (25 mg total) by mouth 2 (two) times daily.  60 tablet  0  . multivitamin (THERAGRAN) per tablet  Take 1 tablet by mouth daily.       . nitroGLYCERIN (NITROSTAT) 0.4 MG SL tablet Place 0.4 mg under the tongue every 5 (five) minutes as needed for chest pain.      . polyethylene glycol powder (GLYCOLAX/MIRALAX) powder Take 17 g by mouth daily.  255 g  0  . warfarin (COUMADIN) 7.5 MG tablet Take 7.5 mg by mouth daily.       No current facility-administered medications on file prior to visit.    BP 110/60  Pulse 75  Temp(Src) 97.6 F (36.4 C) (Oral)  Resp 20  Wt 200 lb (90.719 kg)  BMI 25.67 kg/m2  SpO2 95%       Review of Systems  Constitutional: Negative for fever, chills, appetite change and fatigue.  HENT: Negative for hearing loss, ear pain, congestion, sore throat, trouble swallowing, neck stiffness, dental problem, voice change and tinnitus.   Eyes: Negative for pain, discharge and visual disturbance.  Respiratory: Negative for cough, chest tightness, wheezing and stridor.   Cardiovascular: Negative for chest pain, palpitations and leg swelling.  Gastrointestinal: Negative for nausea, vomiting, abdominal pain, diarrhea, constipation, blood in stool and abdominal distention.  Genitourinary: Negative for urgency, hematuria, flank pain, discharge, difficulty urinating and genital sores.  Musculoskeletal: Negative for myalgias, back pain, joint swelling, arthralgias and gait problem.  Skin: Negative for rash.  Neurological: Negative for dizziness, syncope, speech difficulty, weakness, numbness and headaches.  Hematological: Negative for adenopathy. Does not bruise/bleed easily.  Psychiatric/Behavioral: Negative for behavioral problems and dysphoric mood. The patient is not nervous/anxious.        Objective:   Physical Exam  Constitutional: He is oriented to person, place, and time. He appears well-developed.  HENT:  Head: Normocephalic.  Right Ear: External ear normal.  Left Ear: External ear normal.  Eyes: Conjunctivae and EOM are normal.  Neck: Normal range of  motion.  Cardiovascular: Normal rate and normal heart sounds.   Pulmonary/Chest: Breath sounds normal.  Abdominal: Soft. Bowel sounds are normal. He exhibits no distension. There is no tenderness. There is no rebound and no guarding.  Musculoskeletal: Normal range of motion. He exhibits no edema and no tenderness.  Neurological: He is alert and oriented to person, place, and time.  Psychiatric: He has a normal mood and affect. His behavior is normal.          Assessment & Plan:   History diffuse colitis. Patient's exam is benign.  He did have GI followup earlier today. We'll check a CBC Hypertension stable Atrial fibrillation Coumadin anticoagulation CAD   Will recheck 3 months

## 2013-07-06 ENCOUNTER — Ambulatory Visit (INDEPENDENT_AMBULATORY_CARE_PROVIDER_SITE_OTHER): Payer: Medicare PPO | Admitting: Family

## 2013-07-06 ENCOUNTER — Other Ambulatory Visit: Payer: Self-pay | Admitting: *Deleted

## 2013-07-06 DIAGNOSIS — I4891 Unspecified atrial fibrillation: Secondary | ICD-10-CM

## 2013-07-06 DIAGNOSIS — Z7901 Long term (current) use of anticoagulants: Secondary | ICD-10-CM

## 2013-07-06 MED ORDER — METOPROLOL TARTRATE 25 MG PO TABS: 25.0000 mg | ORAL_TABLET | Freq: Two times a day (BID) | ORAL | Status: DC

## 2013-07-06 NOTE — Patient Instructions (Signed)
Continue to take 7.5 mg and re-check in 3 weeks  Anticoagulation Dose Instructions as of 07/06/2013     Glynis Smiles Tue Wed Thu Fri Sat   New Dose 7.5 mg 7.5 mg 7.5 mg 7.5 mg 7.5 mg 7.5 mg 7.5 mg    Description       Continue to take 7.5 mg and re-check in 3 weeks.  Called patient with dosing instructions and faxed new orders to [facility].

## 2013-07-19 ENCOUNTER — Telehealth: Payer: Self-pay | Admitting: Internal Medicine

## 2013-07-19 MED ORDER — CLORAZEPATE DIPOTASSIUM 3.75 MG PO TABS: 3.7500 mg | ORAL_TABLET | Freq: Three times a day (TID) | ORAL | Status: DC | PRN

## 2013-07-19 NOTE — Telephone Encounter (Signed)
Ok  #90  One TID prn

## 2013-07-19 NOTE — Telephone Encounter (Signed)
Pt request refill of clorazepate (TRANXENE) 3.75 MG tablet  (Today if possible) Pt states he needs a 'nerve pill' due to being as "nervous as a cat". Pt has not taken this med in a while/ [facility] [place]

## 2013-07-19 NOTE — Telephone Encounter (Signed)
Please advise if okay to refill. 

## 2013-07-19 NOTE — Telephone Encounter (Signed)
Spoke to pt told him Rx was called into pharmacy. Pt verbalized understanding.

## 2013-07-21 DIAGNOSIS — I4891 Unspecified atrial fibrillation: Secondary | ICD-10-CM

## 2013-07-21 DIAGNOSIS — I2510 Atherosclerotic heart disease of native coronary artery without angina pectoris: Secondary | ICD-10-CM

## 2013-07-21 DIAGNOSIS — K625 Hemorrhage of anus and rectum: Secondary | ICD-10-CM

## 2013-07-21 DIAGNOSIS — D689 Coagulation defect, unspecified: Secondary | ICD-10-CM

## 2013-07-21 DIAGNOSIS — E785 Hyperlipidemia, unspecified: Secondary | ICD-10-CM

## 2013-07-21 DIAGNOSIS — K5289 Other specified noninfective gastroenteritis and colitis: Secondary | ICD-10-CM

## 2013-07-21 DIAGNOSIS — I10 Essential (primary) hypertension: Secondary | ICD-10-CM

## 2013-07-23 ENCOUNTER — Ambulatory Visit: Payer: Medicare PPO | Admitting: Internal Medicine

## 2013-07-27 ENCOUNTER — Telehealth: Payer: Self-pay | Admitting: Internal Medicine

## 2013-07-27 ENCOUNTER — Telehealth: Payer: Self-pay | Admitting: General Practice

## 2013-07-27 ENCOUNTER — Ambulatory Visit (INDEPENDENT_AMBULATORY_CARE_PROVIDER_SITE_OTHER): Payer: Medicare PPO | Admitting: General Practice

## 2013-07-27 DIAGNOSIS — I4891 Unspecified atrial fibrillation: Secondary | ICD-10-CM

## 2013-07-27 NOTE — Telephone Encounter (Signed)
[name] needs [facility] coum order for pt. His just expired. Pt is on his way there now. Fax [phone]

## 2013-07-27 NOTE — Progress Notes (Signed)
Pre-visit discussion using our clinic review tool. No additional management support is needed unless otherwise documented below in the visit note.  

## 2013-07-27 NOTE — Telephone Encounter (Signed)
Faxed order for protime to [facility] lab. [phone]

## 2013-08-03 ENCOUNTER — Encounter: Payer: Self-pay | Admitting: Internal Medicine

## 2013-08-03 ENCOUNTER — Ambulatory Visit (INDEPENDENT_AMBULATORY_CARE_PROVIDER_SITE_OTHER): Payer: Medicare PPO | Admitting: Internal Medicine

## 2013-08-03 VITALS — BP 120/70 | HR 67 | Temp 97.8°F | Resp 20 | Wt 206.0 lb

## 2013-08-03 DIAGNOSIS — I4891 Unspecified atrial fibrillation: Secondary | ICD-10-CM

## 2013-08-03 DIAGNOSIS — E785 Hyperlipidemia, unspecified: Secondary | ICD-10-CM

## 2013-08-03 DIAGNOSIS — G500 Trigeminal neuralgia: Secondary | ICD-10-CM

## 2013-08-03 DIAGNOSIS — I10 Essential (primary) hypertension: Secondary | ICD-10-CM

## 2013-08-03 MED ORDER — ATORVASTATIN CALCIUM 40 MG PO TABS: 40.0000 mg | ORAL_TABLET | Freq: Every day | ORAL | Status: DC

## 2013-08-03 MED ORDER — CARBAMAZEPINE 200 MG PO TABS: 200.0000 mg | ORAL_TABLET | Freq: Two times a day (BID) | ORAL | Status: DC

## 2013-08-03 NOTE — Progress Notes (Signed)
Pre-visit discussion using our clinic review tool. No additional management support is needed unless otherwise documented below in the visit note.  

## 2013-08-03 NOTE — Patient Instructions (Signed)
Limit your sodium (Salt) intake    It is important that you exercise regularly, at least 20 minutes 3 to 4 times per week.  If you develop chest pain or shortness of breath seek  medical attention.  Return in 3 months for follow-up  

## 2013-08-03 NOTE — Progress Notes (Signed)
Subjective:    Patient ID: [name], male    DOB: 1931/05/14, 79 y.o.   MRN: [id number]  HPI Pre-visit discussion using our clinic review tool. No additional management support is needed unless otherwise documented below in the visit note.  79 year old patient who is seen today for followup. He has hypertension chronic atrial fibrillation coronary artery disease. He has DJD and a history of dyslipidemia. He is doing reasonably well. He states for the past couple months he has had some nocturnal coughing and wheezing.  He is scheduled for [facility]  followup soon  Past Medical History  Diagnosis Date  . CAD (coronary artery disease)   . GERD (gastroesophageal reflux disease)   . Hyperlipidemia   . Hypertension   . Atrial fibrillation   . Trigeminal neuralgia   . CHF (congestive heart failure)   . NSAID-induced gastric ulcer   . Blood transfusion without reported diagnosis   . GI bleed     History   Social History  . Marital Status: Married    Spouse Name: N/A    Number of Children: N/A  . Years of Education: N/A   Occupational History  . Not on file.   Social History Main Topics  . Smoking status: Former Smoker    Quit date: 09/23/1958  . Smokeless tobacco: Former Neurosurgeon     Comment: quit smoking1980-quit chewing 6 months  . Alcohol Use: No  . Drug Use: No  . Sexual Activity: Not on file   Other Topics Concern  . Not on file   Social History Narrative  . No narrative on file    Past Surgical History  Procedure Laterality Date  . Total knee arthroplasty  June 2009    Left  . Shoulder surgery      Left  . Cholecystectomy    . Hip surgery  August 2008    Right total hip replacement  . Esophagogastroduodenoscopy  2011  . Flexible sigmoidoscopy N/A 06/04/2013    Procedure: FLEXIBLE SIGMOIDOSCOPY;  Surgeon: Vertell Novak., MD;  Location: [facility] ENDOSCOPY;  Service: Endoscopy;  Laterality: N/A;    History reviewed. No pertinent family  history.  Allergies  Allergen Reactions  . Enoxaparin Sodium [Enoxaparin Sodium] Anaphylaxis and Other (See Comments)    angioedema  . Codeine Other (See Comments)    hallucinations  . Oxycodone Hcl [Oxycodone Hcl] Other (See Comments)    hallucinations  . Vicodin [Hydrocodone-Acetaminophen] Other (See Comments)    hallucinations  . Sulfonamide Derivatives Rash    Current Outpatient Prescriptions on File Prior to Visit  Medication Sig Dispense Refill  . acetaminophen (TYLENOL) 325 MG tablet Take 650 mg by mouth every 4 (four) hours as needed. pain      . albuterol (PROVENTIL HFA) 108 (90 BASE) MCG/ACT inhaler Inhale 2 puffs into the lungs every 4 (four) hours as needed. For shortness of breath       . Ascorbic Acid (VITAMIN C) 500 MG tablet Take 500 mg by mouth 2 (two) times daily.       Marland Kitchen atorvastatin (LIPITOR) 40 MG tablet Take 40 mg by mouth at bedtime.      . benzonatate (TESSALON) 100 MG capsule Take 100 mg by mouth every 8 (eight) hours.       . Bimatoprost (LUMIGAN) 0.01 % SOLN Place 1 drop into both eyes at bedtime. Both eyes at hour of sleep      . Calcium Carbonate-Vitamin D (CALCIUM + D) 600-200 MG-UNIT per  tablet Take 1 tablet by mouth daily.       . carbamazepine (TEGRETOL) 200 MG tablet Take 200 mg by mouth 2 (two) times daily.      . carbamazepine (TEGRETOL) 200 MG tablet TAKE 1 TABLET BY MOUTH TWICE DAILY  180 tablet  1  . celecoxib (CELEBREX) 200 MG capsule Take 200 mg by mouth 2 (two) times daily.      . clorazepate (TRANXENE) 3.75 MG tablet Take 1 tablet (3.75 mg total) by mouth 3 (three) times daily as needed for anxiety.  90 tablet  0  . Difluprednate (DUREZOL) 0.05 % EMUL Place 1 drop into both eyes daily.       Marland Kitchen docusate sodium (COLACE) 100 MG capsule Take 100 mg by mouth 2 (two) times daily.       Marland Kitchen esomeprazole (NEXIUM) 40 MG capsule Take 40 mg by mouth daily.       . fexofenadine (ALLEGRA) 180 MG tablet Take 180 mg by mouth daily.       Marland Kitchen ketoconazole  (NIZORAL) 2 % shampoo       . metoprolol tartrate (LOPRESSOR) 25 MG tablet Take 1 tablet (25 mg total) by mouth 2 (two) times daily.  180 tablet  1  . multivitamin (THERAGRAN) per tablet Take 1 tablet by mouth daily.       . nitroGLYCERIN (NITROSTAT) 0.4 MG SL tablet Place 0.4 mg under the tongue every 5 (five) minutes as needed for chest pain.      . polyethylene glycol powder (GLYCOLAX/MIRALAX) powder Take 17 g by mouth daily.  255 g  0  . warfarin (COUMADIN) 7.5 MG tablet Take 7.5 mg by mouth daily.       No current facility-administered medications on file prior to visit.    BP 120/70  Pulse 67  Temp(Src) 97.8 F (36.6 C) (Oral)  Resp 20  Wt 206 lb (93.441 kg)  SpO2 97%        Review of Systems  Constitutional: Negative for fever, chills, appetite change and fatigue.  HENT: Negative for congestion, dental problem, ear pain, hearing loss, sore throat, tinnitus, trouble swallowing and voice change.   Eyes: Negative for pain, discharge and visual disturbance.  Respiratory: Positive for cough and wheezing. Negative for chest tightness and stridor.   Cardiovascular: Negative for chest pain, palpitations and leg swelling.  Gastrointestinal: Negative for nausea, vomiting, abdominal pain, diarrhea, constipation, blood in stool and abdominal distention.  Genitourinary: Negative for urgency, hematuria, flank pain, discharge, difficulty urinating and genital sores.  Musculoskeletal: Negative for arthralgias, back pain, gait problem, joint swelling, myalgias and neck stiffness.  Skin: Negative for rash.  Neurological: Negative for dizziness, syncope, speech difficulty, weakness, numbness and headaches.  Hematological: Negative for adenopathy. Does not bruise/bleed easily.  Psychiatric/Behavioral: Negative for behavioral problems and dysphoric mood. The patient is not nervous/anxious.        Objective:   Physical Exam  Constitutional: He is oriented to person, place, and time. He  appears well-developed.  HENT:  Head: Normocephalic.  Right Ear: External ear normal.  Left Ear: External ear normal.  Eyes: Conjunctivae and EOM are normal.  Neck: Normal range of motion.  Cardiovascular: Normal rate and normal heart sounds.   Pulmonary/Chest: Effort normal and breath sounds normal. No respiratory distress. He has no wheezes. He has no rales.  O2 saturation 97%  Abdominal: Bowel sounds are normal.  Musculoskeletal: Normal range of motion. He exhibits no edema and no tenderness.  Neurological: He is  alert and oriented to person, place, and time.  Psychiatric: He has a normal mood and affect. His behavior is normal.          Assessment & Plan:  Hypertension well controlled coronary artery disease Permanent atrial for ablation. Controlled ventricular response  Recheck 3 months

## 2013-08-25 ENCOUNTER — Ambulatory Visit (INDEPENDENT_AMBULATORY_CARE_PROVIDER_SITE_OTHER): Payer: Medicare PPO | Admitting: General Practice

## 2013-08-25 DIAGNOSIS — I4891 Unspecified atrial fibrillation: Secondary | ICD-10-CM

## 2013-08-25 NOTE — Progress Notes (Signed)
Pre-visit discussion using our clinic review tool. No additional management support is needed unless otherwise documented below in the visit note.  

## 2013-08-31 ENCOUNTER — Ambulatory Visit: Payer: Medicare PPO | Admitting: Internal Medicine

## 2013-09-07 ENCOUNTER — Telehealth: Payer: Self-pay | Admitting: Internal Medicine

## 2013-09-07 NOTE — Telephone Encounter (Signed)
Pt had protime drawn at [facility] hospital and requesting protime results

## 2013-09-07 NOTE — Telephone Encounter (Signed)
According to last INR note pt spoke to [place], Charity fundraiser and was given instructions.  Advised pt of instructions again and he verbalized understanding

## 2013-10-04 ENCOUNTER — Telehealth: Payer: Self-pay | Admitting: Internal Medicine

## 2013-10-04 MED ORDER — TRAMADOL HCL 50 MG PO TABS: 50.0000 mg | ORAL_TABLET | Freq: Four times a day (QID) | ORAL | Status: DC | PRN

## 2013-10-04 NOTE — Telephone Encounter (Signed)
Please advise 

## 2013-10-04 NOTE — Telephone Encounter (Signed)
Tramadol 50mg    #60 one every 6 hours for pain

## 2013-10-04 NOTE — Telephone Encounter (Signed)
Pt has sore throat and headache, would also like a med along w wife. Generic also [facility]

## 2013-10-04 NOTE — Telephone Encounter (Signed)
Spoke to pt told him can take OTC Robitussin like wife, sore throat probably from sinus drainage and will call Rx in for Tramadol for pain. Pt verbalized understanding.

## 2013-10-07 ENCOUNTER — Encounter: Payer: Self-pay | Admitting: Internal Medicine

## 2013-10-07 ENCOUNTER — Ambulatory Visit (INDEPENDENT_AMBULATORY_CARE_PROVIDER_SITE_OTHER): Payer: Medicare PPO | Admitting: Internal Medicine

## 2013-10-07 ENCOUNTER — Ambulatory Visit (INDEPENDENT_AMBULATORY_CARE_PROVIDER_SITE_OTHER): Payer: Medicare PPO | Admitting: General Practice

## 2013-10-07 VITALS — BP 130/80 | HR 74 | Temp 97.9°F | Resp 20 | Ht 74.0 in | Wt 208.0 lb

## 2013-10-07 DIAGNOSIS — I4891 Unspecified atrial fibrillation: Secondary | ICD-10-CM

## 2013-10-07 DIAGNOSIS — J069 Acute upper respiratory infection, unspecified: Secondary | ICD-10-CM

## 2013-10-07 DIAGNOSIS — I10 Essential (primary) hypertension: Secondary | ICD-10-CM

## 2013-10-07 DIAGNOSIS — Z23 Encounter for immunization: Secondary | ICD-10-CM

## 2013-10-07 LAB — POCT INR: INR: 2.1

## 2013-10-07 MED ORDER — WARFARIN SODIUM 7.5 MG PO TABS: 7.5000 mg | ORAL_TABLET | Freq: Every day | ORAL | Status: DC

## 2013-10-07 MED ORDER — CARBAMAZEPINE 200 MG PO TABS: ORAL_TABLET | ORAL | Status: DC

## 2013-10-07 MED ORDER — METOPROLOL TARTRATE 25 MG PO TABS: 25.0000 mg | ORAL_TABLET | Freq: Two times a day (BID) | ORAL | Status: DC

## 2013-10-07 NOTE — Progress Notes (Signed)
Subjective:    Patient ID: [name], male    DOB: [birth year], 80 y.o.   MRN: [id number]  HPI  80 year old patient who has a history of hypertension. He presents with a four-day history of hoarseness cough and fatigue. There's been no sputum production fever or chills.  His wife has been ill with similar symptoms. The patient also fell approximately 2 weeks ago and has had some left chest wall discomfort  Past Medical History  Diagnosis Date  . CAD (coronary artery disease)   . GERD (gastroesophageal reflux disease)   . Hyperlipidemia   . Hypertension   . Atrial fibrillation   . Trigeminal neuralgia   . CHF (congestive heart failure)   . NSAID-induced gastric ulcer   . Blood transfusion without reported diagnosis   . GI bleed     History   Social History  . Marital Status: Married    Spouse Name: N/A    Number of Children: N/A  . Years of Education: N/A   Occupational History  . Not on file.   Social History Main Topics  . Smoking status: Former Smoker    Quit date: 09/23/1958  . Smokeless tobacco: Former Systems developer     Comment: quit smoking1980-quit chewing 6 months  . Alcohol Use: No  . Drug Use: No  . Sexual Activity: Not on file   Other Topics Concern  . Not on file   Social History Narrative  . No narrative on file    Past Surgical History  Procedure Laterality Date  . Total knee arthroplasty  June 2009    Left  . Shoulder surgery      Left  . Cholecystectomy    . Hip surgery  August 2008    Right total hip replacement  . Esophagogastroduodenoscopy  2011  . Flexible sigmoidoscopy N/A 06/04/2013    Procedure: FLEXIBLE SIGMOIDOSCOPY;  Surgeon: Winfield Cunas., MD;  Location: [facility] ENDOSCOPY;  Service: Endoscopy;  Laterality: N/A;    History reviewed. No pertinent family history.  Allergies  Allergen Reactions  . Enoxaparin Sodium [Enoxaparin Sodium] Anaphylaxis and Other (See Comments)    angioedema  . Codeine Other (See Comments)   hallucinations  . Oxycodone Hcl [Oxycodone Hcl] Other (See Comments)    hallucinations  . Vicodin [Hydrocodone-Acetaminophen] Other (See Comments)    hallucinations  . Sulfonamide Derivatives Rash    Current Outpatient Prescriptions on File Prior to Visit  Medication Sig Dispense Refill  . acetaminophen (TYLENOL) 325 MG tablet Take 650 mg by mouth every 4 (four) hours as needed. pain      . albuterol (PROVENTIL HFA) 108 (90 BASE) MCG/ACT inhaler Inhale 2 puffs into the lungs every 4 (four) hours as needed. For shortness of breath       . Ascorbic Acid (VITAMIN C) 500 MG tablet Take 500 mg by mouth 2 (two) times daily.       Marland Kitchen atorvastatin (LIPITOR) 40 MG tablet Take 1 tablet (40 mg total) by mouth at bedtime.  90 tablet  4  . benzonatate (TESSALON) 100 MG capsule Take 100 mg by mouth every 8 (eight) hours.       . Bimatoprost (LUMIGAN) 0.01 % SOLN Place 1 drop into both eyes at bedtime. Both eyes at hour of sleep      . Calcium Carbonate-Vitamin D (CALCIUM + D) 600-200 MG-UNIT per tablet Take 1 tablet by mouth daily.       . carbamazepine (TEGRETOL) 200 MG tablet TAKE 1  TABLET BY MOUTH TWICE DAILY  180 tablet  1  . carbamazepine (TEGRETOL) 200 MG tablet Take 1 tablet (200 mg total) by mouth 2 (two) times daily.  180 tablet  4  . celecoxib (CELEBREX) 200 MG capsule Take 200 mg by mouth 2 (two) times daily.      . clorazepate (TRANXENE) 3.75 MG tablet Take 1 tablet (3.75 mg total) by mouth 3 (three) times daily as needed for anxiety.  90 tablet  0  . Difluprednate (DUREZOL) 0.05 % EMUL Place 1 drop into both eyes daily.       Marland Kitchen docusate sodium (COLACE) 100 MG capsule Take 100 mg by mouth 2 (two) times daily.       Marland Kitchen esomeprazole (NEXIUM) 40 MG capsule Take 40 mg by mouth daily.       . fexofenadine (ALLEGRA) 180 MG tablet Take 180 mg by mouth daily.       Marland Kitchen ketoconazole (NIZORAL) 2 % shampoo       . metoprolol tartrate (LOPRESSOR) 25 MG tablet Take 1 tablet (25 mg total) by mouth 2 (two)  times daily.  180 tablet  1  . multivitamin (THERAGRAN) per tablet Take 1 tablet by mouth daily.       . nitroGLYCERIN (NITROSTAT) 0.4 MG SL tablet Place 0.4 mg under the tongue every 5 (five) minutes as needed for chest pain.      . polyethylene glycol powder (GLYCOLAX/MIRALAX) powder Take 17 g by mouth daily.  255 g  0  . traMADol (ULTRAM) 50 MG tablet Take 1 tablet (50 mg total) by mouth every 6 (six) hours as needed.  60 tablet  0  . warfarin (COUMADIN) 7.5 MG tablet Take 7.5 mg by mouth daily.       No current facility-administered medications on file prior to visit.    BP 130/80  Pulse 74  Temp(Src) 97.9 F (36.6 C) (Oral)  Resp 20  Ht 6\' 2"  (1.88 m)  Wt 208 lb (94.348 kg)  BMI 26.69 kg/m2  SpO2 97%       Review of Systems  Constitutional: Positive for activity change, appetite change and fatigue. Negative for fever and chills.  HENT: Positive for voice change. Negative for congestion, dental problem, ear pain, hearing loss, sore throat, tinnitus and trouble swallowing.   Eyes: Negative for pain, discharge and visual disturbance.  Respiratory: Positive for cough. Negative for chest tightness, wheezing and stridor.   Cardiovascular: Negative for chest pain, palpitations and leg swelling.  Gastrointestinal: Negative for nausea, vomiting, abdominal pain, diarrhea, constipation, blood in stool and abdominal distention.  Genitourinary: Negative for urgency, hematuria, flank pain, discharge, difficulty urinating and genital sores.  Musculoskeletal: Negative for arthralgias, back pain, gait problem, joint swelling, myalgias and neck stiffness.  Skin: Negative for rash.  Neurological: Negative for dizziness, syncope, speech difficulty, weakness, numbness and headaches.  Hematological: Negative for adenopathy. Does not bruise/bleed easily.  Psychiatric/Behavioral: Negative for behavioral problems and dysphoric mood. The patient is not nervous/anxious.        Objective:    Physical Exam  Constitutional: He is oriented to person, place, and time. He appears well-developed.  No distress afebrile Hoarse   HENT:  Head: Normocephalic.  Right Ear: External ear normal.  Left Ear: External ear normal.  Eyes: Conjunctivae and EOM are normal.  Neck: Normal range of motion.  Cardiovascular: Normal rate and normal heart sounds.   Pulmonary/Chest: Effort normal and breath sounds normal. No respiratory distress. He has no wheezes. He  has no rales.  Abdominal: Bowel sounds are normal.  Musculoskeletal: Normal range of motion. He exhibits no edema and no tenderness.  Neurological: He is alert and oriented to person, place, and time.  Psychiatric: He has a normal mood and affect. His behavior is normal.          Assessment & Plan:   Viral URI with cough. Motrin symptomatically  hypertension stable  CAD stable

## 2013-10-07 NOTE — Progress Notes (Signed)
Pre-visit discussion using our clinic review tool. No additional management support is needed unless otherwise documented below in the visit note.  

## 2013-10-07 NOTE — Patient Instructions (Signed)
Acute bronchitis symptoms for less than 10 days are generally not helped by antibiotics.  Take over-the-counter expectorants and cough medications such as  Mucinex DM.  Call if there is no improvement in 5 to 7 days or if he developed worsening cough, fever, or new symptoms, such as shortness of breath or chest pain.  Return in 4 months for follow-up

## 2013-10-07 NOTE — Addendum Note (Signed)
Addended by: Marian Sorrow on: 10/07/2013 04:41 PM   Modules accepted: Orders

## 2013-11-02 ENCOUNTER — Encounter: Payer: Self-pay | Admitting: *Deleted

## 2013-11-03 ENCOUNTER — Ambulatory Visit (INDEPENDENT_AMBULATORY_CARE_PROVIDER_SITE_OTHER): Payer: Medicare PPO | Admitting: Internal Medicine

## 2013-11-03 ENCOUNTER — Telehealth: Payer: Self-pay | Admitting: Internal Medicine

## 2013-11-03 ENCOUNTER — Encounter: Payer: Self-pay | Admitting: Internal Medicine

## 2013-11-03 VITALS — BP 130/80 | HR 68 | Temp 97.5°F | Resp 20 | Ht 74.0 in | Wt 216.0 lb

## 2013-11-03 DIAGNOSIS — I2510 Atherosclerotic heart disease of native coronary artery without angina pectoris: Secondary | ICD-10-CM

## 2013-11-03 DIAGNOSIS — I10 Essential (primary) hypertension: Secondary | ICD-10-CM

## 2013-11-03 DIAGNOSIS — E785 Hyperlipidemia, unspecified: Secondary | ICD-10-CM

## 2013-11-03 DIAGNOSIS — I4891 Unspecified atrial fibrillation: Secondary | ICD-10-CM

## 2013-11-03 DIAGNOSIS — M1990 Unspecified osteoarthritis, unspecified site: Secondary | ICD-10-CM

## 2013-11-03 DIAGNOSIS — R079 Chest pain, unspecified: Secondary | ICD-10-CM

## 2013-11-03 DIAGNOSIS — I509 Heart failure, unspecified: Secondary | ICD-10-CM

## 2013-11-03 MED ORDER — PANTOPRAZOLE SODIUM 40 MG PO TBEC: 40.0000 mg | DELAYED_RELEASE_TABLET | Freq: Every day | ORAL | Status: DC

## 2013-11-03 NOTE — Progress Notes (Signed)
Pre-visit discussion using our clinic review tool. No additional management support is needed unless otherwise documented below in the visit note.  

## 2013-11-03 NOTE — Telephone Encounter (Signed)
Pt is needed a referral for [facility], [place] [phone], pt states he has [employer] and they informed him that he need to get the referral from his primary doctor.pt has and appt on 11/15/13 with [facility]. ° °

## 2013-11-03 NOTE — Telephone Encounter (Signed)
Pt is needed a referral for [facility] [place] [phone], pt states he has [employer] and they informed him that he need to get the referral from his primary doctor.pt has and appt on 11/15/13 with [facility].

## 2013-11-03 NOTE — Telephone Encounter (Signed)
Note accidentally closed new not established

## 2013-11-03 NOTE — Progress Notes (Signed)
Subjective:    Patient ID: [name], male    DOB: 1931-07-01, 80 y.o.   MRN: [id number]  HPI  80 year old patient who has a history of GERD as well as coronary artery disease. Patient was admitted to [facility] on February 6 and discharged on Saturday, February 7. He presented with atypical chest pain an MI was ruled out. He is scheduled for a nuclear stress test on February 28. Since his discharge he has done well. He has a history of osteoarthritis and does take Celebrex sparingly. He is on chronic PPI therapy and does have a prior history of gastric ulcer disease. He has permanent atrial fibrillation and remains on Coumadin anticoagulation. He also has a history of trigeminal neuralgia and relapses when Tegretol is discontinued.. In general doing quite well today  Past Medical History  Diagnosis Date  . CAD (coronary artery disease)   . GERD (gastroesophageal reflux disease)   . Hyperlipidemia   . Hypertension   . Atrial fibrillation   . Trigeminal neuralgia   . CHF (congestive heart failure)   . NSAID-induced gastric ulcer   . Blood transfusion without reported diagnosis   . GI bleed     History   Social History  . Marital Status: Married    Spouse Name: N/A    Number of Children: N/A  . Years of Education: N/A   Occupational History  . Not on file.   Social History Main Topics  . Smoking status: Former Smoker    Quit date: 09/23/1958  . Smokeless tobacco: Former Systems developer     Comment: quit smoking1980-quit chewing 6 months  . Alcohol Use: No  . Drug Use: No  . Sexual Activity: Not on file   Other Topics Concern  . Not on file   Social History Narrative  . No narrative on file    Past Surgical History  Procedure Laterality Date  . Total knee arthroplasty  June 2009    Left  . Shoulder surgery      Left  . Cholecystectomy    . Hip surgery  August 2008    Right total hip replacement  . Esophagogastroduodenoscopy  2011  . Flexible  sigmoidoscopy N/A 06/04/2013    Procedure: FLEXIBLE SIGMOIDOSCOPY;  Surgeon: Winfield Cunas., MD;  Location: [facility] ENDOSCOPY;  Service: Endoscopy;  Laterality: N/A;    History reviewed. No pertinent family history.  Allergies  Allergen Reactions  . Enoxaparin Sodium [Enoxaparin Sodium] Anaphylaxis and Other (See Comments)    angioedema  . Codeine Other (See Comments)    hallucinations  . Oxycodone Hcl [Oxycodone Hcl] Other (See Comments)    hallucinations  . Vicodin [Hydrocodone-Acetaminophen] Other (See Comments)    hallucinations  . Sulfonamide Derivatives Rash    Current Outpatient Prescriptions on File Prior to Visit  Medication Sig Dispense Refill  . acetaminophen (TYLENOL) 325 MG tablet Take 650 mg by mouth every 4 (four) hours as needed. pain      . albuterol (PROVENTIL HFA) 108 (90 BASE) MCG/ACT inhaler Inhale 2 puffs into the lungs every 4 (four) hours as needed. For shortness of breath       . Ascorbic Acid (VITAMIN C) 500 MG tablet Take 500 mg by mouth 2 (two) times daily.       Marland Kitchen atorvastatin (LIPITOR) 40 MG tablet Take 1 tablet (40 mg total) by mouth at bedtime.  90 tablet  4  . benzonatate (TESSALON) 100 MG capsule Take 100 mg by  mouth every 8 (eight) hours.       . Bimatoprost (LUMIGAN) 0.01 % SOLN Place 1 drop into both eyes at bedtime. Both eyes at hour of sleep      . Calcium Carbonate-Vitamin D (CALCIUM + D) 600-200 MG-UNIT per tablet Take 1 tablet by mouth daily.       . carbamazepine (TEGRETOL) 200 MG tablet Take 1 tablet (200 mg total) by mouth 2 (two) times daily.  180 tablet  4  . carbamazepine (TEGRETOL) 200 MG tablet TAKE 1 TABLET BY MOUTH TWICE DAILY  180 tablet  1  . celecoxib (CELEBREX) 200 MG capsule Take 200 mg by mouth 2 (two) times daily.      . clorazepate (TRANXENE) 3.75 MG tablet Take 1 tablet (3.75 mg total) by mouth 3 (three) times daily as needed for anxiety.  90 tablet  0  . Difluprednate (DUREZOL) 0.05 % EMUL Place 1 drop into both eyes daily.        Marland Kitchen docusate sodium (COLACE) 100 MG capsule Take 100 mg by mouth 2 (two) times daily.       . fexofenadine (ALLEGRA) 180 MG tablet Take 180 mg by mouth daily.       Marland Kitchen ketoconazole (NIZORAL) 2 % shampoo       . metoprolol tartrate (LOPRESSOR) 25 MG tablet Take 1 tablet (25 mg total) by mouth 2 (two) times daily.  180 tablet  1  . multivitamin (THERAGRAN) per tablet Take 1 tablet by mouth daily.       . nitroGLYCERIN (NITROSTAT) 0.4 MG SL tablet Place 0.4 mg under the tongue every 5 (five) minutes as needed for chest pain.      . polyethylene glycol powder (GLYCOLAX/MIRALAX) powder Take 17 g by mouth daily.  255 g  0  . traMADol (ULTRAM) 50 MG tablet Take 1 tablet (50 mg total) by mouth every 6 (six) hours as needed.  60 tablet  0  . warfarin (COUMADIN) 7.5 MG tablet Take 1 tablet (7.5 mg total) by mouth daily.  90 tablet  5   No current facility-administered medications on file prior to visit.    BP 130/80  Pulse 68  Temp(Src) 97.5 F (36.4 C) (Oral)  Resp 20  Ht 6\' 2"  (1.88 m)  Wt 216 lb (97.977 kg)  BMI 27.72 kg/m2  SpO2 95%       Review of Systems  Constitutional: Negative for fever, chills, appetite change and fatigue.  HENT: Negative for congestion, dental problem, ear pain, hearing loss, sore throat, tinnitus, trouble swallowing and voice change.   Eyes: Negative for pain, discharge and visual disturbance.  Respiratory: Negative for cough, chest tightness, wheezing and stridor.   Cardiovascular: Positive for chest pain. Negative for palpitations and leg swelling.  Gastrointestinal: Negative for nausea, vomiting, abdominal pain, diarrhea, constipation, blood in stool and abdominal distention.  Genitourinary: Negative for urgency, hematuria, flank pain, discharge, difficulty urinating and genital sores.  Musculoskeletal: Positive for arthralgias, back pain and gait problem. Negative for joint swelling, myalgias and neck stiffness.  Skin: Negative for rash.  Neurological:  Negative for dizziness, syncope, speech difficulty, weakness, numbness and headaches.  Hematological: Negative for adenopathy. Does not bruise/bleed easily.  Psychiatric/Behavioral: Negative for behavioral problems and dysphoric mood. The patient is not nervous/anxious.        Objective:   Physical Exam  Constitutional: He is oriented to person, place, and time. He appears well-developed.  HENT:  Head: Normocephalic.  Right Ear: External ear normal.  Left Ear: External ear normal.  Eyes: Conjunctivae and EOM are normal.  Neck: Normal range of motion.  Cardiovascular: Normal rate and normal heart sounds.   Pulmonary/Chest: He has rales.  Bibasilar rales right greater than left O2 saturation 95%  Abdominal: Bowel sounds are normal.  Musculoskeletal: Normal range of motion. He exhibits no edema and no tenderness.  Neurological: He is alert and oriented to person, place, and time.  Psychiatric: He has a normal mood and affect. His behavior is normal.          Assessment & Plan:   History of atypical chest pain. We'll continue aggressive anti-reflux regimen in attempt to minimize Celebrex. We'll proceed with a nuclear stress test on February 23 as scheduled Coronary artery disease Atrial fibrillation Hypertension stable  We'll continue Coumadin anticoagulation Recheck 3 months or as needed

## 2013-11-03 NOTE — Patient Instructions (Signed)
Limit your sodium (Salt) intake  Avoids foods high in acid such as tomatoes citrus juices, and spicy foods.  Avoid eating within two hours of lying down or before exercising.  Do not overheat.  Try smaller more frequent meals.  Cardiac stress test as scheduled  Return in 3 months for follow-up

## 2013-11-04 ENCOUNTER — Telehealth: Payer: Self-pay | Admitting: Internal Medicine

## 2013-11-04 ENCOUNTER — Ambulatory Visit: Payer: Medicare PPO | Admitting: Internal Medicine

## 2013-11-04 NOTE — Telephone Encounter (Signed)
Order for referral to Cardiology done.

## 2013-11-04 NOTE — Telephone Encounter (Signed)
Relevant patient education mailed to patient.  

## 2013-11-05 ENCOUNTER — Telehealth: Payer: Self-pay | Admitting: Internal Medicine

## 2013-11-05 MED ORDER — ALBUTEROL SULFATE HFA 108 (90 BASE) MCG/ACT IN AERS: 2.0000 | INHALATION_SPRAY | Freq: Four times a day (QID) | RESPIRATORY_TRACT | Status: DC | PRN

## 2013-11-05 NOTE — Telephone Encounter (Signed)
Pt is calling in regards to rx pro air inhaler. Pt states that when he went to pick up his rx, the cost was a  $45.00 co-pay.  Pt states he can not afford it and would like to know if dr. Raliegh Ip can prescribe something cheaper.

## 2013-11-05 NOTE — Telephone Encounter (Signed)
Pt states he spoke with dr.k  at his last visit about  his wheezing but forgot to ask if he could write him a rx, pt requesting rx sent to [facility] [phone].

## 2013-11-05 NOTE — Telephone Encounter (Signed)
Please advise 

## 2013-11-05 NOTE — Telephone Encounter (Signed)
Spoke to pt told him I checked with the pharmacy and that is the cheapest, sorry. Pt verbalized understanding.

## 2013-11-05 NOTE — Telephone Encounter (Signed)
Spoke to pt told him new Rx for [employer] sent to pharmacy. Pt verbalized understanding.

## 2013-11-05 NOTE — Telephone Encounter (Signed)
Albuterol MDI 2 inhalations every 6 hours as needed for wheezing

## 2013-11-16 LAB — PROTIME-INR

## 2013-11-17 ENCOUNTER — Telehealth: Payer: Self-pay | Admitting: Internal Medicine

## 2013-11-17 NOTE — Telephone Encounter (Signed)
Pt is calling requesting protime results

## 2013-11-17 NOTE — Telephone Encounter (Addendum)
Pt would likw to know if we received his coum report from [place] yesterday? Pt states he stays sleepy all the time and concerned his blood may be low.

## 2013-11-19 ENCOUNTER — Telehealth: Payer: Self-pay | Admitting: Internal Medicine

## 2013-11-19 NOTE — Telephone Encounter (Signed)
Pt is wanted to get his results from his coumadin check.

## 2013-11-22 LAB — PROTIME-INR: INR: 2 — AB (ref <=1.1)

## 2013-11-23 NOTE — Telephone Encounter (Signed)
Lab to fax results. Results were faxed to [facility] at [place] who is on vacation this week

## 2013-11-24 ENCOUNTER — Telehealth: Payer: Self-pay | Admitting: Internal Medicine

## 2013-11-24 NOTE — Telephone Encounter (Signed)
Advised pt that he is to be taking normal dose of 1 tablet every day except take 1.5 tablets on Wednesdays.  Pt verbalized understanding.

## 2013-11-24 NOTE — Telephone Encounter (Signed)
Pt would like to know what dose of warfarin he needs to be taking.

## 2013-11-30 ENCOUNTER — Ambulatory Visit (INDEPENDENT_AMBULATORY_CARE_PROVIDER_SITE_OTHER): Payer: Medicare PPO | Admitting: General Practice

## 2013-11-30 DIAGNOSIS — Z5181 Encounter for therapeutic drug level monitoring: Secondary | ICD-10-CM

## 2013-11-30 DIAGNOSIS — I4891 Unspecified atrial fibrillation: Secondary | ICD-10-CM

## 2013-11-30 NOTE — Progress Notes (Signed)
Pre visit review using our clinic review tool, if applicable. No additional management support is needed unless otherwise documented below in the visit note. 

## 2013-12-13 ENCOUNTER — Telehealth: Payer: Self-pay | Admitting: Internal Medicine

## 2013-12-13 NOTE — Telephone Encounter (Signed)
Pt would like to know when he is due for next coumadin check at [facility]

## 2013-12-23 ENCOUNTER — Other Ambulatory Visit: Payer: Self-pay | Admitting: Internal Medicine

## 2013-12-30 LAB — PROTIME-INR: INR: 3.4 — AB (ref <=1.1)

## 2013-12-31 ENCOUNTER — Ambulatory Visit (INDEPENDENT_AMBULATORY_CARE_PROVIDER_SITE_OTHER): Payer: Medicare PPO | Admitting: General Practice

## 2013-12-31 DIAGNOSIS — I4891 Unspecified atrial fibrillation: Secondary | ICD-10-CM

## 2013-12-31 DIAGNOSIS — Z5181 Encounter for therapeutic drug level monitoring: Secondary | ICD-10-CM

## 2013-12-31 NOTE — Progress Notes (Signed)
Pre visit review using our clinic review tool, if applicable. No additional management support is needed unless otherwise documented below in the visit note. 

## 2014-01-27 LAB — PROTIME-INR

## 2014-02-01 ENCOUNTER — Telehealth: Payer: Self-pay | Admitting: Internal Medicine

## 2014-02-01 NOTE — Telephone Encounter (Signed)
Spoke with pt and verified he is taking correct dosage.

## 2014-02-01 NOTE — Telephone Encounter (Signed)
Pt called and he wants to verify if he is taking his  warfran correctly he wants a call before 6 pm

## 2014-02-10 ENCOUNTER — Encounter: Payer: Self-pay | Admitting: Internal Medicine

## 2014-02-10 ENCOUNTER — Ambulatory Visit (INDEPENDENT_AMBULATORY_CARE_PROVIDER_SITE_OTHER): Payer: Medicare PPO | Admitting: Internal Medicine

## 2014-02-10 VITALS — BP 120/80 | HR 80 | Temp 97.7°F | Wt 215.0 lb

## 2014-02-10 DIAGNOSIS — E785 Hyperlipidemia, unspecified: Secondary | ICD-10-CM

## 2014-02-10 DIAGNOSIS — M1990 Unspecified osteoarthritis, unspecified site: Secondary | ICD-10-CM

## 2014-02-10 DIAGNOSIS — Z5181 Encounter for therapeutic drug level monitoring: Secondary | ICD-10-CM

## 2014-02-10 DIAGNOSIS — I4891 Unspecified atrial fibrillation: Secondary | ICD-10-CM

## 2014-02-10 DIAGNOSIS — I10 Essential (primary) hypertension: Secondary | ICD-10-CM

## 2014-02-10 DIAGNOSIS — I2510 Atherosclerotic heart disease of native coronary artery without angina pectoris: Secondary | ICD-10-CM

## 2014-02-10 NOTE — Patient Instructions (Signed)
Limit your sodium (Salt) intake  Return in 6 months for follow-up  

## 2014-02-10 NOTE — Progress Notes (Signed)
Subjective:    Patient ID: Eric [name], male    DOB: 1931-07-17, 80 y.o.   MRN: [id number]  HPI 80 year old patient who is seen today for followup.  He has a history of coronary artery disease.  A recent nuclear stress test was very low risk.  History of hypertension and chronic atrial fibrillation.  He remains on Coumadin anticoagulation.  INR was performed about 2 weeks ago.  He has treated hypertension and dyslipidemia.  Doing well today without concerns or complaints except for arthritic pain.  He is having pain primarily in the left hip and left knee area.  Remains on Celebrex  Past Medical History  Diagnosis Date  . CAD (coronary artery disease)   . GERD (gastroesophageal reflux disease)   . Hyperlipidemia   . Hypertension   . Atrial fibrillation   . Trigeminal neuralgia   . CHF (congestive heart failure)   . NSAID-induced gastric ulcer   . Blood transfusion without reported diagnosis   . GI bleed     History   Social History  . Marital Status: Married    Spouse Name: N/A    Number of Children: N/A  . Years of Education: N/A   Occupational History  . Not on file.   Social History Main Topics  . Smoking status: Former Smoker    Quit date: 09/23/1958  . Smokeless tobacco: Former Systems developer     Comment: quit smoking1980-quit chewing 6 months  . Alcohol Use: No  . Drug Use: No  . Sexual Activity: Not on file   Other Topics Concern  . Not on file   Social History Narrative  . No narrative on file    Past Surgical History  Procedure Laterality Date  . Total knee arthroplasty  June 2009    Left  . Shoulder surgery      Left  . Cholecystectomy    . Hip surgery  August 2008    Right total hip replacement  . Esophagogastroduodenoscopy  2011  . Flexible sigmoidoscopy N/A 06/04/2013    Procedure: FLEXIBLE SIGMOIDOSCOPY;  Surgeon: Winfield Cunas., MD;  Location: [facility] ENDOSCOPY;  Service: Endoscopy;  Laterality: N/A;    History reviewed. No pertinent family  history.  Allergies  Allergen Reactions  . Enoxaparin Sodium [Enoxaparin Sodium] Anaphylaxis and Other (See Comments)    angioedema  . Codeine Other (See Comments)    hallucinations  . Oxycodone Hcl [Oxycodone Hcl] Other (See Comments)    hallucinations  . Vicodin [Hydrocodone-Acetaminophen] Other (See Comments)    hallucinations  . Sulfonamide Derivatives Rash    Current Outpatient Prescriptions on File Prior to Visit  Medication Sig Dispense Refill  . acetaminophen (TYLENOL) 325 MG tablet Take 650 mg by mouth every 4 (four) hours as needed. pain      . albuterol (PROAIR HFA) 108 (90 BASE) MCG/ACT inhaler Inhale 2 puffs into the lungs every 6 (six) hours as needed for wheezing or shortness of breath.  1 Inhaler  5  . Ascorbic Acid (VITAMIN C) 500 MG tablet Take 500 mg by mouth 2 (two) times daily.       Marland Kitchen atorvastatin (LIPITOR) 40 MG tablet Take 1 tablet (40 mg total) by mouth at bedtime.  90 tablet  4  . atorvastatin (LIPITOR) 40 MG tablet TAKE 1 TABLET BY MOUTH EVERY NIGHT AT BEDTIME  90 tablet  0  . Bimatoprost (LUMIGAN) 0.01 % SOLN Place 1 drop into both eyes at bedtime. Both eyes at hour of  sleep      . Calcium Carbonate-Vitamin D (CALCIUM + D) 600-200 MG-UNIT per tablet Take 1 tablet by mouth daily.       . carbamazepine (TEGRETOL) 200 MG tablet TAKE 1 TABLET BY MOUTH TWICE DAILY  180 tablet  1  . celecoxib (CELEBREX) 200 MG capsule Take 200 mg by mouth 2 (two) times daily.      . clorazepate (TRANXENE) 3.75 MG tablet Take 1 tablet (3.75 mg total) by mouth 3 (three) times daily as needed for anxiety.  90 tablet  0  . Difluprednate (DUREZOL) 0.05 % EMUL Place 1 drop into both eyes daily.       Marland Kitchen docusate sodium (COLACE) 100 MG capsule Take 100 mg by mouth 2 (two) times daily.       . fexofenadine (ALLEGRA) 180 MG tablet Take 180 mg by mouth daily.       Marland Kitchen ketoconazole (NIZORAL) 2 % shampoo       . metoprolol tartrate (LOPRESSOR) 25 MG tablet Take 1 tablet (25 mg total) by mouth 2  (two) times daily.  180 tablet  1  . multivitamin (THERAGRAN) per tablet Take 1 tablet by mouth daily.       . nitroGLYCERIN (NITROSTAT) 0.4 MG SL tablet Place 0.4 mg under the tongue every 5 (five) minutes as needed for chest pain.      . pantoprazole (PROTONIX) 40 MG tablet Take 1 tablet (40 mg total) by mouth daily.  90 tablet  3  . polyethylene glycol powder (GLYCOLAX/MIRALAX) powder Take 17 g by mouth daily.  255 g  0  . traMADol (ULTRAM) 50 MG tablet Take 1 tablet (50 mg total) by mouth every 6 (six) hours as needed.  60 tablet  0  . warfarin (COUMADIN) 7.5 MG tablet Take 1 tablet (7.5 mg total) by mouth daily.  90 tablet  5   No current facility-administered medications on file prior to visit.    BP 120/80  Pulse 80  Temp(Src) 97.7 F (36.5 C) (Oral)  Wt 215 lb (97.523 kg)  SpO2 94%       Review of Systems  Constitutional: Negative for fever, chills, appetite change and fatigue.  HENT: Negative for congestion, dental problem, ear pain, hearing loss, sore throat, tinnitus, trouble swallowing and voice change.   Eyes: Negative for pain, discharge and visual disturbance.  Respiratory: Negative for cough, chest tightness, wheezing and stridor.   Cardiovascular: Negative for chest pain, palpitations and leg swelling.  Gastrointestinal: Negative for nausea, vomiting, abdominal pain, diarrhea, constipation, blood in stool and abdominal distention.  Genitourinary: Negative for urgency, hematuria, flank pain, discharge, difficulty urinating and genital sores.  Musculoskeletal: Positive for arthralgias, back pain and gait problem. Negative for joint swelling, myalgias and neck stiffness.  Skin: Negative for rash.  Neurological: Negative for dizziness, syncope, speech difficulty, weakness, numbness and headaches.  Hematological: Negative for adenopathy. Does not bruise/bleed easily.  Psychiatric/Behavioral: Negative for behavioral problems and dysphoric mood. The patient is not  nervous/anxious.        Objective:   Physical Exam  Constitutional: He is oriented to person, place, and time. He appears well-developed.  HENT:  Head: Normocephalic.  Right Ear: External ear normal.  Left Ear: External ear normal.  Eyes: Conjunctivae and EOM are normal.  Neck: Normal range of motion.  Cardiovascular: Normal rate and normal heart sounds.   Controlled ventricular response  Pulmonary/Chest: Breath sounds normal.  Abdominal: Bowel sounds are normal.  Musculoskeletal: Normal range of motion.  He exhibits no edema and no tenderness.  Neurological: He is alert and oriented to person, place, and time.  Psychiatric: He has a normal mood and affect. His behavior is normal.          Assessment & Plan:   Hypertension well controlled Chronic atrial fibrillation.  Continue Coumadin anticoagulation Dyslipidemia.  Continue statin therapy Osteoarthritis  CPX 6 months Medications refill

## 2014-02-15 ENCOUNTER — Encounter: Payer: Self-pay | Admitting: Internal Medicine

## 2014-02-25 LAB — PROTIME-INR: INR: 3.8 — AB (ref 0.9–1.1)

## 2014-03-01 ENCOUNTER — Other Ambulatory Visit: Payer: Self-pay | Admitting: General Practice

## 2014-03-01 ENCOUNTER — Ambulatory Visit (INDEPENDENT_AMBULATORY_CARE_PROVIDER_SITE_OTHER): Payer: Medicare PPO | Admitting: General Practice

## 2014-03-01 DIAGNOSIS — I4891 Unspecified atrial fibrillation: Secondary | ICD-10-CM

## 2014-03-01 DIAGNOSIS — Z5181 Encounter for therapeutic drug level monitoring: Secondary | ICD-10-CM

## 2014-03-01 NOTE — Progress Notes (Signed)
Pre visit review using our clinic review tool, if applicable. No additional management support is needed unless otherwise documented below in the visit note. 

## 2014-03-09 ENCOUNTER — Encounter: Payer: Self-pay | Admitting: Internal Medicine

## 2014-03-21 ENCOUNTER — Telehealth: Payer: Self-pay | Admitting: Internal Medicine

## 2014-03-21 NOTE — Telephone Encounter (Signed)
Pt needs referral for dr Elmyra Ricks for knee issues. Due to insurance requirements. pt has seen this dr before. Pt has appt 7/9

## 2014-03-23 NOTE — Telephone Encounter (Signed)
Done  submit a referral  Threw [employer] authorization #[id number] 03/31/2014 - 10/01/2014 Dr. Rudean Haskell [facility]  Orthopedic Surgeon  Address: [address] [place], [place] [address]  [phone] Pt aware

## 2014-03-23 NOTE — Telephone Encounter (Signed)
Called pt to see what  exactly she is requesting.  I call Dr Tonna Boehringer office pt does not have a schedule appt . If you is requesting a referral the Dr will have to put that in . I called pt and left a msg  For her to call the office to clarify what is it that she is needing . Can not submit this to [employer] pt has no  appt on file  , and if she needs a referral . Again Dr would have to put in.

## 2014-03-28 ENCOUNTER — Telehealth: Payer: Self-pay | Admitting: Internal Medicine

## 2014-03-28 NOTE — Telephone Encounter (Signed)
Error/gd °

## 2014-03-31 ENCOUNTER — Telehealth: Payer: Self-pay | Admitting: General Practice

## 2014-03-31 ENCOUNTER — Ambulatory Visit: Payer: Medicare PPO | Admitting: General Practice

## 2014-03-31 DIAGNOSIS — Z5181 Encounter for therapeutic drug level monitoring: Secondary | ICD-10-CM

## 2014-03-31 DIAGNOSIS — I4891 Unspecified atrial fibrillation: Secondary | ICD-10-CM

## 2014-03-31 LAB — PROTIME-INR: INR: 7.9 — AB (ref 0.9–1.1)

## 2014-03-31 NOTE — Telephone Encounter (Signed)
Attempted to call patient 2 times and left a message to hold coumadin through Sunday and go to lab on Monday and have them re-check INR.  Instructed patient to go directly to ER if any unusual bleeding.  Also called cell phone number twice with no answer.

## 2014-03-31 NOTE — Patient Instructions (Signed)
Left 2 messages on patient's home phone with instructions to hold coumadin through Sunday and go to lab on Monday to be re-checked.  Instructed pt to go to ER if any unusual bleeding occurs.  Notified Sunday Sp[facility] and Dr. Burnice Logan of findings.  Also unable to reach patient on cell phone and unable to leave message.

## 2014-03-31 NOTE — Progress Notes (Signed)
Pre visit review using our clinic review tool, if applicable. No additional management support is needed unless otherwise documented below in the visit note. 

## 2014-04-01 ENCOUNTER — Telehealth: Payer: Self-pay | Admitting: Internal Medicine

## 2014-04-01 NOTE — Telephone Encounter (Signed)
Pt called to say that coumadin check at [facility] and  It was 7.9  Pt would like a call

## 2014-04-04 ENCOUNTER — Ambulatory Visit (INDEPENDENT_AMBULATORY_CARE_PROVIDER_SITE_OTHER): Payer: Medicare PPO | Admitting: Family Medicine

## 2014-04-04 DIAGNOSIS — Z5181 Encounter for therapeutic drug level monitoring: Secondary | ICD-10-CM

## 2014-04-04 LAB — POCT INR: INR: 1.5

## 2014-04-29 LAB — PROTIME-INR

## 2014-05-02 LAB — PROTIME-INR

## 2014-05-10 ENCOUNTER — Ambulatory Visit (INDEPENDENT_AMBULATORY_CARE_PROVIDER_SITE_OTHER): Payer: Medicare PPO | Admitting: *Deleted

## 2014-05-10 ENCOUNTER — Telehealth: Payer: Self-pay | Admitting: Internal Medicine

## 2014-05-10 DIAGNOSIS — Z5181 Encounter for therapeutic drug level monitoring: Secondary | ICD-10-CM

## 2014-05-10 LAB — POCT INR: INR: 2.7

## 2014-05-10 NOTE — Telephone Encounter (Signed)
Caller: Jerard/Patient; Phone: [phone]; Reason for Call: Pt calling for Coumadin levels and dosing instructions.  Per EPIC: advised Pt of Coumadin draw, 2.7, and to continue normal dosing, reck in 4 weeks, dosing is 7.5mg  daily.  Pt verbalized understanding.

## 2014-05-10 NOTE — Telephone Encounter (Signed)
Noted  

## 2014-05-16 ENCOUNTER — Encounter: Payer: Self-pay | Admitting: Internal Medicine

## 2014-06-03 ENCOUNTER — Other Ambulatory Visit: Payer: Self-pay | Admitting: *Deleted

## 2014-06-03 ENCOUNTER — Ambulatory Visit (INDEPENDENT_AMBULATORY_CARE_PROVIDER_SITE_OTHER): Payer: Medicare PPO

## 2014-06-03 DIAGNOSIS — Z23 Encounter for immunization: Secondary | ICD-10-CM

## 2014-06-03 LAB — PROTIME-INR: INR: 10 — AB (ref 0.9–1.1)

## 2014-06-03 MED ORDER — CARBAMAZEPINE 200 MG PO TABS: ORAL_TABLET | ORAL | Status: DC

## 2014-06-03 MED ORDER — METOPROLOL TARTRATE 25 MG PO TABS: 25.0000 mg | ORAL_TABLET | Freq: Two times a day (BID) | ORAL | Status: DC

## 2014-06-06 ENCOUNTER — Encounter: Payer: Self-pay | Admitting: Internal Medicine

## 2014-06-06 ENCOUNTER — Ambulatory Visit (INDEPENDENT_AMBULATORY_CARE_PROVIDER_SITE_OTHER): Payer: Medicare PPO | Admitting: Internal Medicine

## 2014-06-06 ENCOUNTER — Telehealth: Payer: Self-pay | Admitting: *Deleted

## 2014-06-06 ENCOUNTER — Other Ambulatory Visit: Payer: Self-pay | Admitting: *Deleted

## 2014-06-06 ENCOUNTER — Telehealth: Payer: Self-pay | Admitting: Internal Medicine

## 2014-06-06 ENCOUNTER — Ambulatory Visit (INDEPENDENT_AMBULATORY_CARE_PROVIDER_SITE_OTHER): Payer: Medicare PPO | Admitting: Family

## 2014-06-06 VITALS — BP 110/62 | HR 75 | Temp 97.8°F | Resp 20 | Ht 74.0 in | Wt 200.0 lb

## 2014-06-06 DIAGNOSIS — I482 Chronic atrial fibrillation, unspecified: Secondary | ICD-10-CM

## 2014-06-06 DIAGNOSIS — D689 Coagulation defect, unspecified: Secondary | ICD-10-CM

## 2014-06-06 DIAGNOSIS — Z5181 Encounter for therapeutic drug level monitoring: Secondary | ICD-10-CM

## 2014-06-06 DIAGNOSIS — I10 Essential (primary) hypertension: Secondary | ICD-10-CM

## 2014-06-06 DIAGNOSIS — T45515A Adverse effect of anticoagulants, initial encounter: Secondary | ICD-10-CM

## 2014-06-06 DIAGNOSIS — Z8679 Personal history of other diseases of the circulatory system: Secondary | ICD-10-CM

## 2014-06-06 DIAGNOSIS — D6832 Hemorrhagic disorder due to extrinsic circulating anticoagulants: Secondary | ICD-10-CM

## 2014-06-06 DIAGNOSIS — I4891 Unspecified atrial fibrillation: Secondary | ICD-10-CM

## 2014-06-06 LAB — POCT INR: INR: 1.2

## 2014-06-06 MED ORDER — WARFARIN SODIUM 5 MG PO TABS: 5.0000 mg | ORAL_TABLET | Freq: Every day | ORAL | Status: DC

## 2014-06-06 NOTE — Telephone Encounter (Signed)
[facility] Triage Call Report Triage Record Num: [id number] Operator: Helene Kelp Patient Name: Eric [name] Call Date & Time: 06/03/2014 7:48:49PM Patient Phone: [phone] PCP: Marletta Lor Patient Gender: Male PCP Fax : [phone] Patient DOB: [birth year] Practice Name: [facility] Reason for Call: Follow-up call from previous triage note dated 06/03/14 at 1810; Pt. CB #: [phone]; Note: Pt. to be contacted to advise of risk of bleeding, needs to be evaluated in local ED d/t critical PT/INR and orders per Dr. Maudie Mercury; Pt. states he has already taken his Coumadin 7.5mg  at 1800; Pt. states that he will go to [facility] in [place] for evaluation; ED was notified of pt; Note to office. Protocol(s) Used: Office Note Recommended Outcome per Protocol: Information Noted and Sent to Office Reason for Outcome: Caller information to office Care Advice: ~ 09/

## 2014-06-06 NOTE — Telephone Encounter (Signed)
Pt would like to talk to you about his coumadin levels. He was advised to go to the ED due to his levels being at a critical level. He went to [facility], pt is schedule to see PCP on 06/07/14 at 10:30.  He would like a callback today regarding the issue.

## 2014-06-06 NOTE — Patient Instructions (Signed)
Discontinue Celebrex therapy  Take [phone] mg of Tylenol every 6 hours as needed for pain relief or fever.  Avoid taking more than 3000 mg in a 24-hour period (  This may cause liver damage).  Return in 3 months for follow-up

## 2014-06-06 NOTE — Telephone Encounter (Signed)
Pt called back again and is scheduled today to see Dr. Raliegh Ip at 2:30 pm.

## 2014-06-06 NOTE — Progress Notes (Signed)
Pre visit review using our clinic review tool, if applicable. No additional management support is needed unless otherwise documented below in the visit note. 

## 2014-06-06 NOTE — Progress Notes (Signed)
Subjective:    Patient ID: Eric [name], male    DOB: 10-03-1930, 80 y.o.   MRN: [id number]  HPI 80 year old patient who has a history of permanent atrial fibrillation and has been on chronic Coumadin anticoagulation.  He has been on a stable dose of 7 point 5 mg.  3 days ago, he was seen at [facility] for routine followup and INR was noted to be quite elevated at 10 point 0.  He received treatment in the ED setting and has been off Coumadin for the past 3 days.  He has had some spontaneous bruising involving both arms left abdominal wall.  He also has some ecchymosis involving the dorsal aspect of his left wrist from the IV access.   Past Medical History  Diagnosis Date  . CAD (coronary artery disease)   . GERD (gastroesophageal reflux disease)   . Hyperlipidemia   . Hypertension   . Atrial fibrillation   . Trigeminal neuralgia   . CHF (congestive heart failure)   . NSAID-induced gastric ulcer   . Blood transfusion without reported diagnosis   . GI bleed     History   Social History  . Marital Status: Married    Spouse Name: N/A    Number of Children: N/A  . Years of Education: N/A   Occupational History  . Not on file.   Social History Main Topics  . Smoking status: Former Smoker    Quit date: 09/23/1958  . Smokeless tobacco: Former Systems developer     Comment: quit smoking1980-quit chewing 6 months  . Alcohol Use: No  . Drug Use: No  . Sexual Activity: Not on file   Other Topics Concern  . Not on file   Social History Narrative  . No narrative on file    Past Surgical History  Procedure Laterality Date  . Total knee arthroplasty  June 2009    Left  . Shoulder surgery      Left  . Cholecystectomy    . Hip surgery  August 2008    Right total hip replacement  . Esophagogastroduodenoscopy  2011  . Flexible sigmoidoscopy N/A 06/04/2013    Procedure: FLEXIBLE SIGMOIDOSCOPY;  Surgeon: Winfield Cunas., MD;  Location: [facility] ENDOSCOPY;  Service: Endoscopy;   Laterality: N/A;    History reviewed. No pertinent family history.  Allergies  Allergen Reactions  . Enoxaparin Sodium [Enoxaparin Sodium] Anaphylaxis and Other (See Comments)    angioedema  . Codeine Other (See Comments)    hallucinations  . Oxycodone Hcl [Oxycodone Hcl] Other (See Comments)    hallucinations  . Vicodin [Hydrocodone-Acetaminophen] Other (See Comments)    hallucinations  . Sulfonamide Derivatives Rash    Current Outpatient Prescriptions on File Prior to Visit  Medication Sig Dispense Refill  . acetaminophen (TYLENOL) 325 MG tablet Take 650 mg by mouth every 4 (four) hours as needed. pain      . albuterol (PROAIR HFA) 108 (90 BASE) MCG/ACT inhaler Inhale 2 puffs into the lungs every 6 (six) hours as needed for wheezing or shortness of breath.  1 Inhaler  5  . Ascorbic Acid (VITAMIN C) 500 MG tablet Take 500 mg by mouth 2 (two) times daily.       Marland Kitchen atorvastatin (LIPITOR) 40 MG tablet Take 1 tablet (40 mg total) by mouth at bedtime.  90 tablet  4  . atorvastatin (LIPITOR) 40 MG tablet TAKE 1 TABLET BY MOUTH EVERY NIGHT AT BEDTIME  90 tablet  0  .  Bimatoprost (LUMIGAN) 0.01 % SOLN Place 1 drop into both eyes at bedtime. Both eyes at hour of sleep      . Calcium Carbonate-Vitamin D (CALCIUM + D) 600-200 MG-UNIT per tablet Take 1 tablet by mouth daily.       . carbamazepine (TEGRETOL) 200 MG tablet TAKE 1 TABLET BY MOUTH TWICE DAILY  180 tablet  1  . celecoxib (CELEBREX) 200 MG capsule Take 200 mg by mouth 2 (two) times daily.      . clorazepate (TRANXENE) 3.75 MG tablet Take 1 tablet (3.75 mg total) by mouth 3 (three) times daily as needed for anxiety.  90 tablet  0  . Difluprednate (DUREZOL) 0.05 % EMUL Place 1 drop into both eyes daily.       Marland Kitchen docusate sodium (COLACE) 100 MG capsule Take 100 mg by mouth 2 (two) times daily.       . fexofenadine (ALLEGRA) 180 MG tablet Take 180 mg by mouth daily.       Marland Kitchen ketoconazole (NIZORAL) 2 % shampoo       . metoprolol tartrate  (LOPRESSOR) 25 MG tablet Take 1 tablet (25 mg total) by mouth 2 (two) times daily.  180 tablet  1  . multivitamin (THERAGRAN) per tablet Take 1 tablet by mouth daily.       . nitroGLYCERIN (NITROSTAT) 0.4 MG SL tablet Place 0.4 mg under the tongue every 5 (five) minutes as needed for chest pain.      . pantoprazole (PROTONIX) 40 MG tablet Take 1 tablet (40 mg total) by mouth daily.  90 tablet  3  . polyethylene glycol powder (GLYCOLAX/MIRALAX) powder Take 17 g by mouth daily.  255 g  0  . traMADol (ULTRAM) 50 MG tablet Take 1 tablet (50 mg total) by mouth every 6 (six) hours as needed.  60 tablet  0  . warfarin (COUMADIN) 7.5 MG tablet Take 1 tablet (7.5 mg total) by mouth daily.  90 tablet  5   No current facility-administered medications on file prior to visit.    BP 110/62  Pulse 75  Temp(Src) 97.8 F (36.6 C) (Oral)  Resp 20  Ht 6\' 2"  (1.88 m)  Wt 200 lb (90.719 kg)  BMI 25.67 kg/m2  SpO2 97%     Review of Systems  Constitutional: Negative for fever, chills, appetite change and fatigue.  HENT: Negative for congestion, dental problem, ear pain, hearing loss, sore throat, tinnitus, trouble swallowing and voice change.   Eyes: Negative for pain, discharge and visual disturbance.  Respiratory: Negative for cough, chest tightness, wheezing and stridor.   Cardiovascular: Negative for chest pain, palpitations and leg swelling.  Gastrointestinal: Negative for nausea, vomiting, abdominal pain, diarrhea, constipation, blood in stool and abdominal distention.  Genitourinary: Negative for urgency, hematuria, flank pain, discharge, difficulty urinating and genital sores.  Musculoskeletal: Negative for arthralgias, back pain, gait problem, joint swelling, myalgias and neck stiffness.  Skin: Negative for rash.       Easy bruisability  Neurological: Negative for dizziness, syncope, speech difficulty, weakness, numbness and headaches.  Hematological: Negative for adenopathy. Does not  bruise/bleed easily.  Psychiatric/Behavioral: Negative for behavioral problems and dysphoric mood. The patient is not nervous/anxious.        Objective:   Physical Exam  Constitutional: He is oriented to person, place, and time. He appears well-developed.  HENT:  Head: Normocephalic.  Right Ear: External ear normal.  Left Ear: External ear normal.  Eyes: Conjunctivae and EOM are normal.  Neck:  Normal range of motion.  Cardiovascular: Normal rate and normal heart sounds.   Irregular rhythm with controlled ventricular response  Pulmonary/Chest: Breath sounds normal.  Abdominal: Bowel sounds are normal.  Musculoskeletal: Normal range of motion. He exhibits no edema and no tenderness.  Neurological: He is alert and oriented to person, place, and time.  Skin:  Resolving ecchymoses over both lower arms on the dorsal aspect of the left hand  Psychiatric: He has a normal mood and affect. His behavior is normal.          Assessment & Plan:   Coumadin coagulopathy.  Now completely reversed with a normal INR.  Will restart dose of 5 mg daily and recheck INR in one week.  We'll track closely Atrial fibrillation and history of CVA History of gastric ulcer and upper GI bleed.  History of chronic Celebrex use with Protonix.  Gastric side for protection.  Risk and benefits of Celebrex discussed at length Osteoarthritis

## 2014-06-06 NOTE — Patient Instructions (Signed)
Take 5 mg of coumadin daily. Recheck in 1 week. [facility] Main Lab [phone].   Anticoagulation Dose Instructions as of 06/06/2014     Eric [name] Tue Wed Thu Fri Sat   New Dose 5 mg 5 mg 5 mg 5 mg 5 mg 5 mg 5 mg    Description       Take 5 mg of coumadin daily. Recheck in 1 week. [facility] Main Lab [phone].

## 2014-06-07 ENCOUNTER — Ambulatory Visit: Payer: Medicare PPO | Admitting: Internal Medicine

## 2014-06-10 ENCOUNTER — Telehealth: Payer: Self-pay | Admitting: Internal Medicine

## 2014-06-10 DIAGNOSIS — R2681 Unsteadiness on feet: Secondary | ICD-10-CM

## 2014-06-10 NOTE — Telephone Encounter (Signed)
Pt following up on request. Would like to know if  you can get this. Pt cannot get fax number to work.

## 2014-06-10 NOTE — Telephone Encounter (Signed)
Pt notified order for Rollator with seat faxed to [employer]. Pt verbalized understanding.

## 2014-06-10 NOTE — Telephone Encounter (Signed)
Pt states he needs an order for the roll a matic chair to be faxed to  [facility] Telephone:  [phone].

## 2014-06-14 ENCOUNTER — Ambulatory Visit (INDEPENDENT_AMBULATORY_CARE_PROVIDER_SITE_OTHER): Payer: Medicare PPO | Admitting: *Deleted

## 2014-06-14 DIAGNOSIS — I482 Chronic atrial fibrillation, unspecified: Secondary | ICD-10-CM

## 2014-06-14 DIAGNOSIS — I4891 Unspecified atrial fibrillation: Secondary | ICD-10-CM

## 2014-06-14 DIAGNOSIS — Z5181 Encounter for therapeutic drug level monitoring: Secondary | ICD-10-CM

## 2014-06-14 LAB — POCT INR: INR: 1.7

## 2014-06-16 ENCOUNTER — Encounter: Payer: Self-pay | Admitting: Internal Medicine

## 2014-06-27 LAB — POCT INR: INR: 4.1

## 2014-06-28 ENCOUNTER — Ambulatory Visit: Payer: Medicare PPO | Admitting: Family

## 2014-06-28 ENCOUNTER — Telehealth: Payer: Self-pay | Admitting: Internal Medicine

## 2014-06-28 DIAGNOSIS — Z5181 Encounter for therapeutic drug level monitoring: Secondary | ICD-10-CM

## 2014-06-28 NOTE — Telephone Encounter (Signed)
Pt has an appt with Dr Steward Ros tomorrow at 2:30. The phone # is [phone]. He is being seen for cardiology consult.

## 2014-06-28 NOTE — Telephone Encounter (Signed)
Referral faxed to [facility] 06/28/14

## 2014-07-06 ENCOUNTER — Telehealth: Payer: Self-pay | Admitting: Internal Medicine

## 2014-07-06 ENCOUNTER — Other Ambulatory Visit: Payer: Self-pay | Admitting: Internal Medicine

## 2014-07-06 NOTE — Telephone Encounter (Signed)
Pt called stating Dr. Eustaquio Maize office hasn't received the referral he needs.  He has an appointment with them on 07/07/14 at 3:15.  There fax number is [phone].

## 2014-07-06 NOTE — Telephone Encounter (Signed)
Instructions given to pt and he verbalized understanding and states that he has been taking correctly: Hold coumadin x 2 days then change dose to take 1 tablet everyday except 1 1/2 tablets Monday, Wednesday. Recheck in 2 weeks. Dosing instructions called to pt [phone]

## 2014-07-06 NOTE — Telephone Encounter (Signed)
Pt forgot have to take his coumadin. Please call pt before 6pm

## 2014-07-07 ENCOUNTER — Telehealth: Payer: Self-pay | Admitting: Internal Medicine

## 2014-07-07 DIAGNOSIS — I2510 Atherosclerotic heart disease of native coronary artery without angina pectoris: Secondary | ICD-10-CM

## 2014-07-07 DIAGNOSIS — I10 Essential (primary) hypertension: Secondary | ICD-10-CM

## 2014-07-07 NOTE — Telephone Encounter (Signed)
Pt sees this dr every 6 mos.

## 2014-07-07 NOTE — Telephone Encounter (Signed)
Order for referral to Cardiology done and Neoma Laming aware appt is today.

## 2014-07-07 NOTE — Telephone Encounter (Signed)
Pt need a referral to see his Cardiology. He has an appointment today at 3:15    Doctor ; Dr Elam Dutch [facility]

## 2014-07-07 NOTE — Telephone Encounter (Signed)
Authorization in suspended status ( pending ) #[id number] [facility]: Richardo Priest MD  Address: [address] [place], [place] [address]  [phone]

## 2014-07-11 LAB — POCT INR: INR: 3.1

## 2014-07-12 ENCOUNTER — Ambulatory Visit (INDEPENDENT_AMBULATORY_CARE_PROVIDER_SITE_OTHER): Payer: Medicare PPO | Admitting: Family

## 2014-07-12 ENCOUNTER — Telehealth: Payer: Self-pay

## 2014-07-12 DIAGNOSIS — I482 Chronic atrial fibrillation, unspecified: Secondary | ICD-10-CM

## 2014-07-12 DIAGNOSIS — Z5181 Encounter for therapeutic drug level monitoring: Secondary | ICD-10-CM

## 2014-07-12 NOTE — Telephone Encounter (Signed)
Pt aware take 1/2 tab today only the continue 1 tab daily except 1 1/2 on MWF. Recheck in 3 weeks

## 2014-07-19 ENCOUNTER — Telehealth: Payer: Self-pay | Admitting: Internal Medicine

## 2014-07-19 NOTE — Telephone Encounter (Signed)
Spoke to pt, told him we did not put you on Oxygen. Asked pt if he has Oxygen? Pt said no, he bought a pulse oximetry at [facility] and they asked him if he is suppose to be on Oxygen. Told pt no oxygen needed, we did not order. Pt verbalized understanding.

## 2014-07-19 NOTE — Telephone Encounter (Signed)
Pt would like to know did md put him on oxygen.

## 2014-07-22 ENCOUNTER — Encounter: Payer: Self-pay | Admitting: Internal Medicine

## 2014-08-01 LAB — POCT INR: INR: 3.9

## 2014-08-02 ENCOUNTER — Ambulatory Visit: Payer: Medicare PPO | Admitting: Family

## 2014-08-02 ENCOUNTER — Ambulatory Visit: Payer: Medicare PPO | Admitting: Internal Medicine

## 2014-08-02 ENCOUNTER — Telehealth: Payer: Self-pay | Admitting: Family

## 2014-08-02 DIAGNOSIS — I482 Chronic atrial fibrillation, unspecified: Secondary | ICD-10-CM

## 2014-08-02 DIAGNOSIS — Z5181 Encounter for therapeutic drug level monitoring: Secondary | ICD-10-CM

## 2014-08-02 NOTE — Patient Instructions (Signed)
Hold Coumadin today only. Continue 1 tablet everyday except 1 1/2 tablets Monday, Wednesday. Recheck in 3 weeks. Dosing instructions called to pt [phone]  Anticoagulation Dose Instructions as of 08/02/2014      Dorene Grebe Tue Wed Thu Fri Sat   New Dose 5 mg 7.5 mg 5 mg 7.5 mg 5 mg 7.5 mg 5 mg    Description        Hold Coumadin today only. Continue 1 tablet everyday except 1 1/2 tablets Monday, Wednesday. Recheck in 3 weeks. Dosing instructions called to pt [phone]

## 2014-08-02 NOTE — Telephone Encounter (Signed)
Hold Coumadin today only. Continue 1 tablet everyday except 1 1/2 tablets Monday, Wednesday. Recheck in 3 weeks. Dosing instructions called to pt [phone]

## 2014-08-02 NOTE — Telephone Encounter (Signed)
Pt takes 7.5mg  on Mon/Wed/Fri and 5mg  all other days.  Instructions given to pt and he verbalized understanding

## 2014-08-09 ENCOUNTER — Other Ambulatory Visit: Payer: Self-pay | Admitting: Internal Medicine

## 2014-08-11 ENCOUNTER — Telehealth: Payer: Self-pay | Admitting: Internal Medicine

## 2014-08-11 MED ORDER — CARBAMAZEPINE 200 MG PO TABS: ORAL_TABLET | ORAL | Status: DC

## 2014-08-11 NOTE — Telephone Encounter (Signed)
Noted  

## 2014-08-11 NOTE — Telephone Encounter (Signed)
Patient Information:  Caller Name: [name]  Phone: [phone]  Patient: Eric [name], Eric [name]  Gender: Male  DOB: 15-Apr-1931  Age: 80 Years  PCP: Bluford Kaufmann (Family Practice > 84yrs old)  Office Follow Up:  Does the office need to follow up with this patient?: No  Instructions For The Office: N/A  RN Note:  Pt. needs an OV for the evaluation of the Rt. knee that is still swollen and red one week post injury. Attempted to schedule with Dr. Raliegh Ip., but pt. needs to arrange transportation. Will call back when he knows what his son's schedule is.  Symptoms  Reason For Call & Symptoms: [name] to the ED for Rt. knee injury on11/11/15. Pt. fell on it. They x-rayed it. No fracture. Pt. elevated the leg and they bandaged it. Pt. now states the knee is red and causing pain. Is still swollen. No fever. Hx of using Warfarin and also hx of arthritis.  Reviewed Health History In EMR: Yes  Reviewed Medications In EMR: Yes  Reviewed Allergies In EMR: Yes  Reviewed Surgeries / Procedures: Yes  Date of Onset of Symptoms: 08/03/2014  Guideline(s) Used:  Knee Injury  Disposition Per Guideline:   See Within 3 Days in Office  Reason For Disposition Reached:   Injury and pain has not improved after 3 days  Advice Given:  Call Back If:  Pain becomes severe  Pain or swelling lasts more than 2 weeks  You become worse.  Reassurance - Direct Blow (Contusion, Bruise)  A direct blow to your knee can cause a contusion. Contusion is the medical term for bruise.  Symptoms are mild pain, swelling, and/or bruising.  Reassurance - Bending or Twisting Injury (Strain, Sprain):  Strain and sprain are the medical terms used to describe over-stretching of the muscles and ligaments of the knee. A twisting or bending injury can cause a strain or sprain.  The main symptom is pain that is worse with movement and walking. Swelling can occur. Rarely there may be slight bruising.  Apply Heat to the Area:  Beginning 48  hours after an injury, apply a warm washcloth or heating pad for 10 minutes three times a day.  This will help increase blood flow and improve healing.  Wrap with an Elastic Bandage:  Wrap the injured part with a snug, elastic bandage for 48 hours.  The pressure from the bandage can make it feel better and help prevent swelling.  If your start to get numbness or tingling of your foot or toes, the bandage may be too tight. Loosen the bandage wrap.  Elevate the Leg:  Lay down and put your leg on a pillow. This puts (elevates) the knee above the heart.  Do this for 15-20 minutes, 2-3 times a day, for the first two days.  This can also help decrease swelling, bruising, and pain.  Rest vs. Movement:  Movement is generally more healing in the long term than rest.  Continue normal activities (like walking) as much as your pain permits.  Call Back If:  Pain becomes severe  Pain or swelling lasts more than 2 weeks  You become worse.  Patient Will Follow Care Advice:  YES

## 2014-08-11 NOTE — Telephone Encounter (Signed)
Pt request refill of the following: carbamazepine (TEGRETOL) 200 MG tablet,atorvastatin (LIPITOR) 40 MG tablet   Pt request  90 day supply     Phamacy:[facility]

## 2014-08-11 NOTE — Telephone Encounter (Signed)
Rxs sent

## 2014-08-12 ENCOUNTER — Ambulatory Visit (INDEPENDENT_AMBULATORY_CARE_PROVIDER_SITE_OTHER): Payer: Medicare PPO | Admitting: Internal Medicine

## 2014-08-12 ENCOUNTER — Encounter: Payer: Self-pay | Admitting: Internal Medicine

## 2014-08-12 VITALS — BP 110/62 | HR 87 | Temp 97.9°F | Resp 20

## 2014-08-12 DIAGNOSIS — M159 Polyosteoarthritis, unspecified: Secondary | ICD-10-CM

## 2014-08-12 DIAGNOSIS — M150 Primary generalized (osteo)arthritis: Secondary | ICD-10-CM

## 2014-08-12 DIAGNOSIS — S8001XD Contusion of right knee, subsequent encounter: Secondary | ICD-10-CM

## 2014-08-12 DIAGNOSIS — I10 Essential (primary) hypertension: Secondary | ICD-10-CM

## 2014-08-12 DIAGNOSIS — I482 Chronic atrial fibrillation, unspecified: Secondary | ICD-10-CM

## 2014-08-12 NOTE — Patient Instructions (Signed)
Limit your sodium (Salt) intake  Elevated your leg as much as possible  Call or return to clinic prn if these symptoms worsen or fail to improve as anticipated.

## 2014-08-12 NOTE — Progress Notes (Signed)
Subjective:    Patient ID: Eric [name], male    DOB: [birth year], 80 y.o.   MRN: [id number]  HPI 80 year old patient who has been on chronic Coumadin anticoagulation.  He fell recently, sustaining trauma to his right knee.  He was evaluated at [facility].  Evaluation included radiographs of the right knee that was negative for acute fracture.  He also had a chest x-ray that revealed 2 small pulmonary nodules.  Follow-up in one year was suggested  He has had significant right knee pain and swelling which has improved nicely over the past several days.  He is seen today in a wheelchair but has been weightbearing with a walker  Past Medical History  Diagnosis Date  . CAD (coronary artery disease)   . GERD (gastroesophageal reflux disease)   . Hyperlipidemia   . Hypertension   . Atrial fibrillation   . Trigeminal neuralgia   . CHF (congestive heart failure)   . NSAID-induced gastric ulcer   . Blood transfusion without reported diagnosis   . GI bleed     History   Social History  . Marital Status: Married    Spouse Name: N/A    Number of Children: N/A  . Years of Education: N/A   Occupational History  . Not on file.   Social History Main Topics  . Smoking status: Former Smoker    Quit date: 09/23/1958  . Smokeless tobacco: Former Systems developer     Comment: quit smoking1980-quit chewing 6 months  . Alcohol Use: No  . Drug Use: No  . Sexual Activity: Not on file   Other Topics Concern  . Not on file   Social History Narrative    Past Surgical History  Procedure Laterality Date  . Total knee arthroplasty  June 2009    Left  . Shoulder surgery      Left  . Cholecystectomy    . Hip surgery  August 2008    Right total hip replacement  . Esophagogastroduodenoscopy  2011  . Flexible sigmoidoscopy N/A 06/04/2013    Procedure: FLEXIBLE SIGMOIDOSCOPY;  Surgeon: Winfield Cunas., MD;  Location: [facility] ENDOSCOPY;  Service: Endoscopy;  Laterality: N/A;    No family  history on file.  Allergies  Allergen Reactions  . Enoxaparin Sodium [Enoxaparin Sodium] Anaphylaxis and Other (See Comments)    angioedema  . Codeine Other (See Comments)    hallucinations  . Oxycodone Hcl [Oxycodone Hcl] Other (See Comments)    hallucinations  . Vicodin [Hydrocodone-Acetaminophen] Other (See Comments)    hallucinations  . Sulfonamide Derivatives Rash    Current Outpatient Prescriptions on File Prior to Visit  Medication Sig Dispense Refill  . acetaminophen (TYLENOL) 325 MG tablet Take 650 mg by mouth every 4 (four) hours as needed. pain    . albuterol (PROAIR HFA) 108 (90 BASE) MCG/ACT inhaler Inhale 2 puffs into the lungs every 6 (six) hours as needed for wheezing or shortness of breath. 1 Inhaler 5  . Ascorbic Acid (VITAMIN C) 500 MG tablet Take 500 mg by mouth 2 (two) times daily.     Marland Kitchen atorvastatin (LIPITOR) 40 MG tablet TAKE 1 TABLET BY MOUTH EVERY NIGHT AT BEDTIME 90 tablet 0  . atorvastatin (LIPITOR) 40 MG tablet TAKE 1 TABLET AT BEDTIME 90 tablet 1  . Bimatoprost (LUMIGAN) 0.01 % SOLN Place 1 drop into both eyes at bedtime. Both eyes at hour of sleep    . Calcium Carbonate-Vitamin D (CALCIUM + D) 600-200 MG-UNIT per  tablet Take 1 tablet by mouth daily.     . carbamazepine (TEGRETOL) 200 MG tablet TAKE 1 TABLET BY MOUTH TWICE DAILY 180 tablet 1  . clorazepate (TRANXENE) 3.75 MG tablet Take 1 tablet (3.75 mg total) by mouth 3 (three) times daily as needed for anxiety. 90 tablet 0  . Difluprednate (DUREZOL) 0.05 % EMUL Place 1 drop into both eyes daily.     Marland Kitchen docusate sodium (COLACE) 100 MG capsule Take 100 mg by mouth 2 (two) times daily.     . fexofenadine (ALLEGRA) 180 MG tablet Take 180 mg by mouth daily.     Marland Kitchen ketoconazole (NIZORAL) 2 % shampoo     . metoprolol tartrate (LOPRESSOR) 25 MG tablet Take 1 tablet (25 mg total) by mouth 2 (two) times daily. 180 tablet 1  . metoprolol tartrate (LOPRESSOR) 25 MG tablet TAKE 1 TABLET BY MOUTH TWICE DAILY 180 tablet  1  . multivitamin (THERAGRAN) per tablet Take 1 tablet by mouth daily.     . nitroGLYCERIN (NITROSTAT) 0.4 MG SL tablet Place 0.4 mg under the tongue every 5 (five) minutes as needed for chest pain.    . pantoprazole (PROTONIX) 40 MG tablet Take 1 tablet (40 mg total) by mouth daily. 90 tablet 3  . polyethylene glycol powder (GLYCOLAX/MIRALAX) powder Take 17 g by mouth daily. 255 g 0  . traMADol (ULTRAM) 50 MG tablet Take 1 tablet (50 mg total) by mouth every 6 (six) hours as needed. 60 tablet 0  . warfarin (COUMADIN) 5 MG tablet Take 1 tablet (5 mg total) by mouth daily. 30 tablet 2   No current facility-administered medications on file prior to visit.    BP 110/62 mmHg  Pulse 87  Temp(Src) 97.9 F (36.6 C) (Oral)  Resp 20  SpO2 98%     Review of Systems  Constitutional: Negative for fever, chills, appetite change and fatigue.  HENT: Negative for congestion, dental problem, ear pain, hearing loss, sore throat, tinnitus, trouble swallowing and voice change.   Eyes: Negative for pain, discharge and visual disturbance.  Respiratory: Negative for cough, chest tightness, wheezing and stridor.   Cardiovascular: Negative for chest pain, palpitations and leg swelling.  Gastrointestinal: Negative for nausea, vomiting, abdominal pain, diarrhea, constipation, blood in stool and abdominal distention.  Genitourinary: Negative for urgency, hematuria, flank pain, discharge, difficulty urinating and genital sores.  Musculoskeletal: Positive for joint swelling, arthralgias and gait problem. Negative for myalgias, back pain and neck stiffness.  Skin: Negative for rash.  Neurological: Negative for dizziness, syncope, speech difficulty, weakness, numbness and headaches.  Hematological: Negative for adenopathy. Does not bruise/bleed easily.  Psychiatric/Behavioral: Negative for behavioral problems and dysphoric mood. The patient is not nervous/anxious.        Objective:   Physical Exam    Constitutional: He appears well-developed and well-nourished. No distress.  Musculoskeletal:  Resolving hematoma right knee with ecchymoses and soft tissue swelling Unable to fully extend the knee due to soft tissue swelling  Some peripheral edema involving the right lower leg Ecchymoses extend to the mid posterior thigh          Assessment & Plan:   Resolving right knee hematoma.  Will continue symptomatic treatment Chronic atrial fibrillation Chronic Coumadin anticoagulation  Recheck 3 months or as needed

## 2014-08-12 NOTE — Progress Notes (Signed)
Pre visit review using our clinic review tool, if applicable. No additional management support is needed unless otherwise documented below in the visit note. 

## 2014-08-15 ENCOUNTER — Telehealth: Payer: Self-pay | Admitting: Internal Medicine

## 2014-08-15 NOTE — Telephone Encounter (Signed)
emmi mailed  °

## 2014-08-23 LAB — PROTIME-INR

## 2014-08-24 ENCOUNTER — Ambulatory Visit: Payer: Medicare PPO | Admitting: Family

## 2014-08-24 ENCOUNTER — Telehealth: Payer: Self-pay

## 2014-08-24 DIAGNOSIS — Z5181 Encounter for therapeutic drug level monitoring: Secondary | ICD-10-CM

## 2014-08-24 DIAGNOSIS — I482 Chronic atrial fibrillation, unspecified: Secondary | ICD-10-CM

## 2014-08-24 LAB — POCT INR: INR: 1.9

## 2014-08-24 NOTE — Telephone Encounter (Signed)
Take an extra 1/2 tab today only. Then continue 1 tablet everyday except 1 1/2 tablets Monday, Wednesday. Recheck in 3 weeks.   Pt aware and verbalized understanding

## 2014-08-24 NOTE — Patient Instructions (Signed)
Take an extra 1/2 tab today only. Then continue 1 tablet everyday except 1 1/2 tablets Monday, Wednesday. Recheck in 3 weeks. Dosing instructions called to pt [phone]  Anticoagulation Dose Instructions as of 08/24/2014      Dorene Grebe Tue Wed Thu Fri Sat   New Dose 5 mg 7.5 mg 5 mg 7.5 mg 5 mg 7.5 mg 5 mg    Description        Take an extra 1/2 tab today only. Then continue 1 tablet everyday except 1 1/2 tablets Monday, Wednesday. Recheck in 3 weeks. Dosing instructions called to pt [phone]

## 2014-09-02 ENCOUNTER — Encounter: Payer: Self-pay | Admitting: Internal Medicine

## 2014-09-30 ENCOUNTER — Telehealth: Payer: Self-pay | Admitting: *Deleted

## 2014-09-30 LAB — PROTIME-INR

## 2014-09-30 MED ORDER — WARFARIN SODIUM 5 MG PO TABS: 5.0000 mg | ORAL_TABLET | Freq: Every day | ORAL | Status: DC

## 2014-09-30 NOTE — Telephone Encounter (Signed)
Pt called back, told him PT/INR is elevated, confirmed dosage of Coumadin. Told pt to hold Coumadin till Monday and then start 5 mg one tablet daily and repeat PT/INR in 2 weeks per Dr. Raliegh Ip. Pt verbalized understanding. Told pt I will send orders to [facility] for repeat. Pt verbalized understanding.

## 2014-09-30 NOTE — Telephone Encounter (Signed)
Received fax with PT/INR results: PT 41.5, INR 3.9 Dr. Raliegh Ip reviewed results. Confirm pt is taking Coumadin 5 mg 4 x /week, 7.5 mg M W F. If so hold Coumadin until Monday, start 5 mg one tablet daily and repeat PT/INR in 2 weeks per Dr.K.

## 2014-09-30 NOTE — Telephone Encounter (Signed)
Left message on voicemail to call office.  

## 2014-10-05 ENCOUNTER — Encounter: Payer: Self-pay | Admitting: Internal Medicine

## 2014-10-06 NOTE — Telephone Encounter (Signed)
Lab orders for PT/INR faxed to [facility].

## 2014-10-10 ENCOUNTER — Telehealth: Payer: Self-pay | Admitting: Internal Medicine

## 2014-10-12 ENCOUNTER — Telehealth: Payer: Self-pay | Admitting: Family

## 2014-10-12 NOTE — Telephone Encounter (Signed)
Pt would like to go back to [facility] where he has been getting protime done

## 2014-10-12 NOTE — Telephone Encounter (Signed)
Schedule PT/INR appt for 3 weeks.

## 2014-10-12 NOTE — Telephone Encounter (Signed)
Pt always has pt/INR drawn at [facility] and results are faxed to the office.  Pt aware to have it drawn in 3 weeks

## 2014-10-13 ENCOUNTER — Telehealth: Payer: Self-pay | Admitting: Internal Medicine

## 2014-10-13 NOTE — Telephone Encounter (Signed)
Spoke to pt, told him Jonelle Sidle from [facility] called about ordering an adjustable knee brace from [place] for you. Pt asked how it was going to cost. Told pt I am not sure but he should contact Jonelle Sidle and ask her and if you want me to order it just let me know. Pt verbalized understanding.

## 2014-10-13 NOTE — Telephone Encounter (Signed)
Duplicate, see other message.

## 2014-10-13 NOTE — Telephone Encounter (Signed)
Pt called back and said to send order for Knee Brace to [facility]. Told pt okay will send order. Order faxed to [phone].

## 2014-10-13 NOTE — Telephone Encounter (Signed)
Eric [name] called from [facility] she is pt care manager. She said pt was suppose to ask for a adjustable knee brace and she is asking if this can be ordered. Order can be sent to [place] fax number

## 2014-10-21 ENCOUNTER — Other Ambulatory Visit: Payer: Self-pay | Admitting: Internal Medicine

## 2014-10-21 ENCOUNTER — Ambulatory Visit: Payer: Medicare PPO | Admitting: Family

## 2014-10-21 ENCOUNTER — Telehealth: Payer: Self-pay | Admitting: Internal Medicine

## 2014-10-21 ENCOUNTER — Telehealth: Payer: Self-pay | Admitting: Family

## 2014-10-21 DIAGNOSIS — Z5181 Encounter for therapeutic drug level monitoring: Secondary | ICD-10-CM

## 2014-10-21 DIAGNOSIS — I482 Chronic atrial fibrillation, unspecified: Secondary | ICD-10-CM

## 2014-10-21 LAB — POCT INR: INR: 2.7

## 2014-10-21 NOTE — Telephone Encounter (Signed)
Pt would like to have a knee and back brace order . Pt does not know the name of company however the phone # is [phone]. Pt has arthritis and back pain.

## 2014-10-21 NOTE — Telephone Encounter (Signed)
Continue 1 tablet everyday except 1 1/2 tablets Monday, Wednesday. Recheck in 4 weeks. Dosing instructions called to pt [phone]

## 2014-10-21 NOTE — Patient Instructions (Signed)
Continue 1 tablet everyday except 1 1/2 tablets Monday, Wednesday. Recheck in 4 weeks. Dosing instructions called to pt [phone]  Anticoagulation Dose Instructions as of 10/21/2014      Dorene Grebe Tue Wed Thu Fri Sat   New Dose 5 mg 7.5 mg 5 mg 7.5 mg 5 mg 7.5 mg 5 mg    Description        Continue 1 tablet everyday except 1 1/2 tablets Monday, Wednesday. Recheck in 4 weeks. Dosing instructions called to pt [phone]

## 2014-10-21 NOTE — Telephone Encounter (Signed)
Spoke to pt, told him I sent Rx for Knee brace to [facility] and was sent a fax back that they do not carry knee braces. Pt said need order sent to [facility] for knee brace and back brace. Told pt okay will send order.

## 2014-10-24 NOTE — Telephone Encounter (Signed)
Continue taking 5 mg daily.

## 2014-10-24 NOTE — Telephone Encounter (Signed)
10/21/2014 [facility] and asked for fax number to send order over for pt. They said they would call me back.

## 2014-10-24 NOTE — Telephone Encounter (Signed)
Pt aware but notes that he takes 5mg  qd for the past 3 weeks

## 2014-10-24 NOTE — Telephone Encounter (Signed)
Attempted to reach pt. No voicemail. Will try again later

## 2014-10-26 NOTE — Telephone Encounter (Signed)
Received a call from [facility] in [place] and a prescription is needed so medicare and can be billed.  The number is [phone].  Prescription faxed to [phone]

## 2014-10-26 NOTE — Telephone Encounter (Signed)
Called and spoke with pt and advised that he may want to get knee brace and back brace at local place in [place].

## 2014-11-14 LAB — PROTIME-INR: INR: 2.6 — AB (ref 0.9–1.1)

## 2014-11-15 ENCOUNTER — Ambulatory Visit: Payer: Medicare PPO | Admitting: General Practice

## 2014-11-15 DIAGNOSIS — Z5181 Encounter for therapeutic drug level monitoring: Secondary | ICD-10-CM

## 2014-11-15 NOTE — Progress Notes (Signed)
Agree with plan 

## 2014-11-15 NOTE — Progress Notes (Signed)
Pre visit review using our clinic review tool, if applicable. No additional management support is needed unless otherwise documented below in the visit note. 

## 2014-11-24 ENCOUNTER — Encounter: Payer: Self-pay | Admitting: Internal Medicine

## 2014-11-24 ENCOUNTER — Ambulatory Visit (INDEPENDENT_AMBULATORY_CARE_PROVIDER_SITE_OTHER): Payer: Commercial Managed Care - HMO | Admitting: Internal Medicine

## 2014-11-24 VITALS — BP 150/86 | HR 74 | Temp 98.2°F | Resp 20 | Ht 74.0 in | Wt 210.0 lb

## 2014-11-24 DIAGNOSIS — I10 Essential (primary) hypertension: Secondary | ICD-10-CM

## 2014-11-24 DIAGNOSIS — L0591 Pilonidal cyst without abscess: Secondary | ICD-10-CM

## 2014-11-24 NOTE — Patient Instructions (Signed)
Pilonidal Cyst A pilonidal cyst occurs when hairs get trapped (ingrown) beneath the skin in the crease between the buttocks over your sacrum (the bone under that crease). Pilonidal cysts are most common in young men with a lot of body hair. When the cyst is ruptured (breaks) or leaking, fluid from the cyst may cause burning and itching. If the cyst becomes infected, it causes a painful swelling filled with pus (abscess). The pus and trapped hairs need to be removed (often by lancing) so that the infection can heal. However, recurrence is common and an operation may be needed to remove the cyst. HOME CARE INSTRUCTIONS   If the cyst was NOT INFECTED:  Keep the area clean and dry. Bathe or shower daily. Wash the area well with a germ-killing soap. Warm tub baths may help prevent infection and help with drainage. Dry the area well with a towel.  Avoid tight clothing to keep area as moisture free as possible.  Keep area between buttocks as free of hair as possible. A depilatory may be used.  If the cyst WAS INFECTED and needed to be drained:  Your caregiver packed the wound with gauze to keep the wound open. This allows the wound to heal from the inside outwards and continue draining.  Return for a wound check in 1 day or as suggested.  If you take tub baths or showers, repack the wound with gauze following them. Sponge baths (at the sink) are a good alternative.  If an antibiotic was ordered to fight the infection, take as directed.  Only take over-the-counter or prescription medicines for pain, discomfort, or fever as directed by your caregiver.  After the drain is removed, use sitz baths for 20 minutes 4 times per day. Clean the wound gently with mild unscented soap, pat dry, and then apply a dry dressing. SEEK MEDICAL CARE IF:   You have increased pain, swelling, redness, drainage, or bleeding from the area.  You have a fever.  You have muscles aches, dizziness, or a general ill  feeling. Document Released: 09/06/2000 Document Revised: 12/02/2011 Document Reviewed: 11/04/2008 ExitCare Patient Information 2015 [employer]. This information is not intended to replace advice given to you by your health care provider. Make sure you discuss any questions you have with your health care provider.  

## 2014-11-24 NOTE — Progress Notes (Signed)
Pre visit review using our clinic review tool, if applicable. No additional management support is needed unless otherwise documented below in the visit note. 

## 2014-11-24 NOTE — Progress Notes (Signed)
Subjective:    Patient ID: Eric [name], male    DOB: 14-Jan-1931, 81 y.o.   MRN: [id number]  HPI  81 year old seen today complaining of an inflammatory skin lesion involving his right mid abdominal wall.  He has been using warm compresses and the lesion has steadily improved over the past several days He has essential hypertension which has been stable  Past Medical History  Diagnosis Date  . CAD (coronary artery disease)   . GERD (gastroesophageal reflux disease)   . Hyperlipidemia   . Hypertension   . Atrial fibrillation   . Trigeminal neuralgia   . CHF (congestive heart failure)   . NSAID-induced gastric ulcer   . Blood transfusion without reported diagnosis   . GI bleed     History   Social History  . Marital Status: Married    Spouse Name: N/A  . Number of Children: N/A  . Years of Education: N/A   Occupational History  . Not on file.   Social History Main Topics  . Smoking status: Former Smoker    Quit date: 09/23/1958  . Smokeless tobacco: Former Systems developer     Comment: quit smoking1980-quit chewing 6 months  . Alcohol Use: No  . Drug Use: No  . Sexual Activity: Not on file   Other Topics Concern  . Not on file   Social History Narrative    Past Surgical History  Procedure Laterality Date  . Total knee arthroplasty  June 2009    Left  . Shoulder surgery      Left  . Cholecystectomy    . Hip surgery  August 2008    Right total hip replacement  . Esophagogastroduodenoscopy  2011  . Flexible sigmoidoscopy N/A 06/04/2013    Procedure: FLEXIBLE SIGMOIDOSCOPY;  Surgeon: Winfield Cunas., MD;  Location: [facility] ENDOSCOPY;  Service: Endoscopy;  Laterality: N/A;    No family history on file.  Allergies  Allergen Reactions  . Enoxaparin Sodium [Enoxaparin Sodium] Anaphylaxis and Other (See Comments)    angioedema  . Codeine Other (See Comments)    hallucinations  . Oxycodone Hcl [Oxycodone Hcl] Other (See Comments)    hallucinations  . Vicodin  [Hydrocodone-Acetaminophen] Other (See Comments)    hallucinations  . Sulfonamide Derivatives Rash    Current Outpatient Prescriptions on File Prior to Visit  Medication Sig Dispense Refill  . acetaminophen (TYLENOL) 325 MG tablet Take 650 mg by mouth every 4 (four) hours as needed. pain    . albuterol (PROAIR HFA) 108 (90 BASE) MCG/ACT inhaler Inhale 2 puffs into the lungs every 6 (six) hours as needed for wheezing or shortness of breath. 1 Inhaler 5  . Ascorbic Acid (VITAMIN C) 500 MG tablet Take 500 mg by mouth 2 (two) times daily.     Marland Kitchen atorvastatin (LIPITOR) 40 MG tablet TAKE 1 TABLET BY MOUTH EVERY NIGHT AT BEDTIME 90 tablet 0  . atorvastatin (LIPITOR) 40 MG tablet TAKE 1 TABLET AT BEDTIME 90 tablet 1  . Bimatoprost (LUMIGAN) 0.01 % SOLN Place 1 drop into both eyes at bedtime. Both eyes at hour of sleep    . Calcium Carbonate-Vitamin D (CALCIUM + D) 600-200 MG-UNIT per tablet Take 1 tablet by mouth daily.     . carbamazepine (TEGRETOL) 200 MG tablet TAKE 1 TABLET BY MOUTH TWICE DAILY 180 tablet 1  . clorazepate (TRANXENE) 3.75 MG tablet Take 1 tablet (3.75 mg total) by mouth 3 (three) times daily as needed for anxiety. 90 tablet 0  .  Difluprednate (DUREZOL) 0.05 % EMUL Place 1 drop into both eyes daily.     Marland Kitchen docusate sodium (COLACE) 100 MG capsule Take 100 mg by mouth 2 (two) times daily.     . fexofenadine (ALLEGRA) 180 MG tablet Take 180 mg by mouth daily.     Marland Kitchen ketoconazole (NIZORAL) 2 % shampoo     . metoprolol tartrate (LOPRESSOR) 25 MG tablet Take 1 tablet (25 mg total) by mouth 2 (two) times daily. 180 tablet 1  . metoprolol tartrate (LOPRESSOR) 25 MG tablet TAKE 1 TABLET BY MOUTH TWICE DAILY 180 tablet 1  . multivitamin (THERAGRAN) per tablet Take 1 tablet by mouth daily.     . nitroGLYCERIN (NITROSTAT) 0.4 MG SL tablet Place 0.4 mg under the tongue every 5 (five) minutes as needed for chest pain.    . pantoprazole (PROTONIX) 40 MG tablet TAKE 1 TABLET BY MOUTH EVERY DAY 90  tablet 0  . polyethylene glycol powder (GLYCOLAX/MIRALAX) powder Take 17 g by mouth daily. 255 g 0  . traMADol (ULTRAM) 50 MG tablet Take 1 tablet (50 mg total) by mouth every 6 (six) hours as needed. 60 tablet 0  . warfarin (COUMADIN) 5 MG tablet Take 1 tablet (5 mg total) by mouth daily. 30 tablet 2   No current facility-administered medications on file prior to visit.    BP 150/86 mmHg  Pulse 74  Temp(Src) 98.2 F (36.8 C) (Oral)  Resp 20  Ht 6\' 2"  (1.88 m)  Wt 210 lb (95.255 kg)  BMI 26.95 kg/m2  SpO2 98%     Review of Systems  Constitutional: Negative for fever, chills, appetite change and fatigue.  HENT: Negative for congestion, dental problem, ear pain, hearing loss, sore throat, tinnitus, trouble swallowing and voice change.   Eyes: Negative for pain, discharge and visual disturbance.  Respiratory: Negative for cough, chest tightness, wheezing and stridor.   Cardiovascular: Negative for chest pain, palpitations and leg swelling.  Gastrointestinal: Negative for nausea, vomiting, abdominal pain, diarrhea, constipation, blood in stool and abdominal distention.  Genitourinary: Negative for urgency, hematuria, flank pain, discharge, difficulty urinating and genital sores.  Musculoskeletal: Negative for myalgias, back pain, joint swelling, arthralgias, gait problem and neck stiffness.  Skin: Positive for wound. Negative for rash.  Neurological: Negative for dizziness, syncope, speech difficulty, weakness, numbness and headaches.  Hematological: Negative for adenopathy. Does not bruise/bleed easily.  Psychiatric/Behavioral: Negative for behavioral problems and dysphoric mood. The patient is not nervous/anxious.        Objective:   Physical Exam  Constitutional: He appears well-developed and well-nourished. No distress.  Skin:  A 2 cm erythematous, mildly tender area of induration in the right mid abdominal area.  There is some associated postinflammatory scaling.  No  fluctuance          Assessment & Plan:   Resolving pilonidal cyst, left midabdominal wall.  Will continue local skin care.  Will hold antibiotic therapy at this time.  The wound is steadily improving with local wound care Hypertension, stable

## 2014-11-29 IMAGING — CT CT ABD-PELV W/ CM
2 of 5 series · 17 of 46 positions shown, 19 images · IV contrast (CONTRAST)
Comparison: 08/23/2011

CLINICAL DATA: Left lower quadrant pain and tenderness. Nausea
vomiting. Bloody diarrhea.

EXAM:
CT ABDOMEN AND PELVIS WITH CONTRAST
TECHNIQUE: Multidetector CT imaging of the abdomen and pelvis was performed
using the standard protocol following bolus administration of
intravenous contrast.
CONTRAST:  100mL OMNIPAQUE IOHEXOL 300 MG/ML  SOLN

[Series 2: routine · axial · 0.88mm/px · z∈[+466,+916]mm · 14 of 104 slices shown, 16 images]
[im 7/104  soft-tissue]
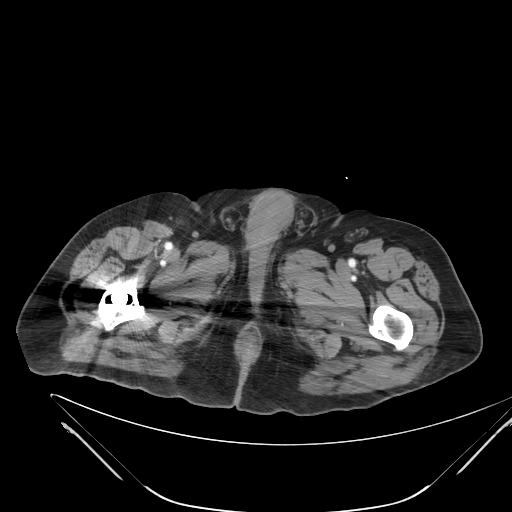
[im 7/104  bone]
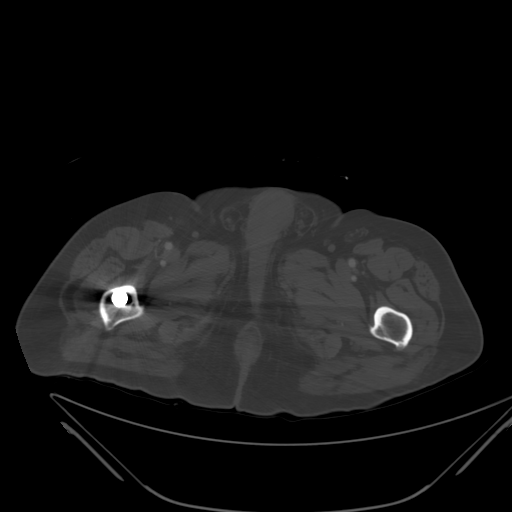
[im 14/104  soft-tissue]
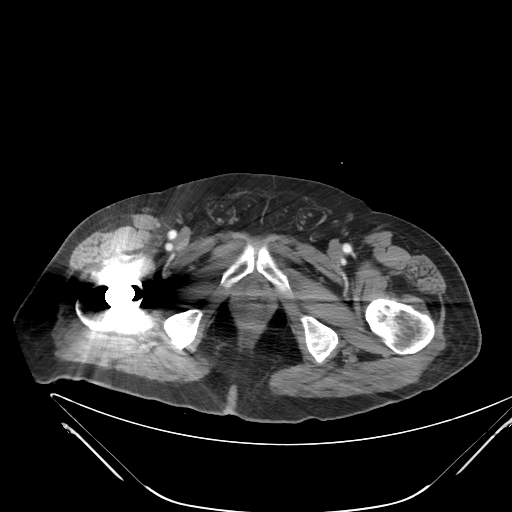
[im 21/104  soft-tissue]
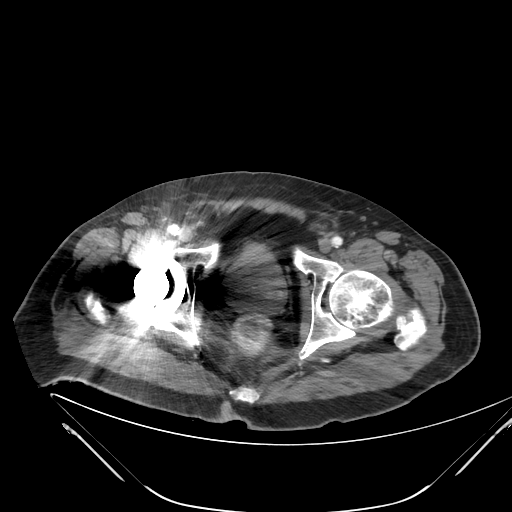
[im 28/104  soft-tissue]
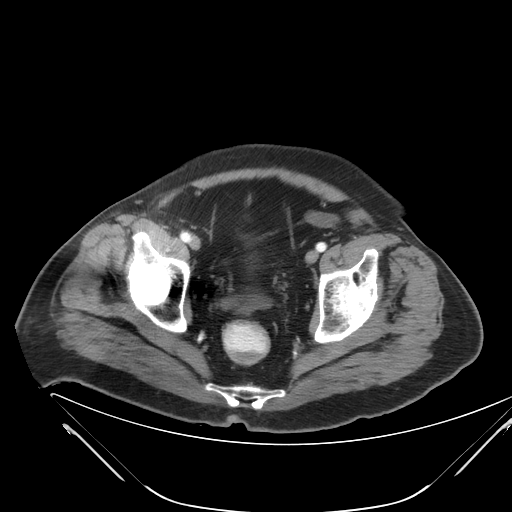
[im 35/104  soft-tissue]
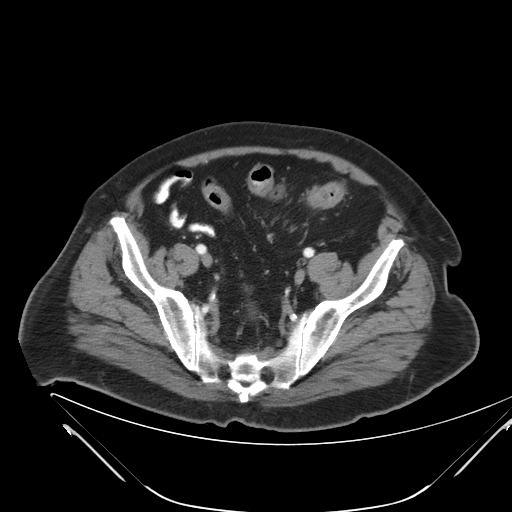
[im 42/104  soft-tissue]
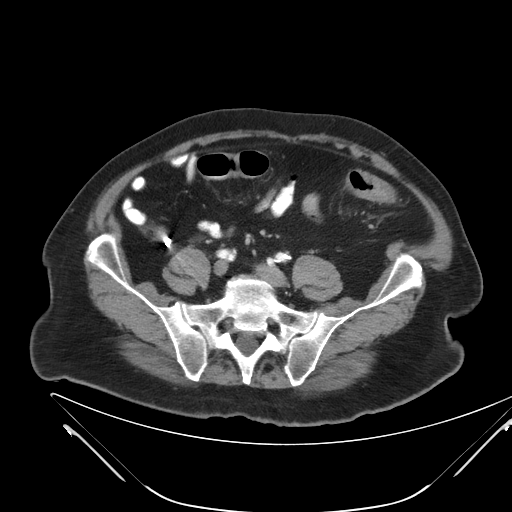
[im 49/104  soft-tissue]
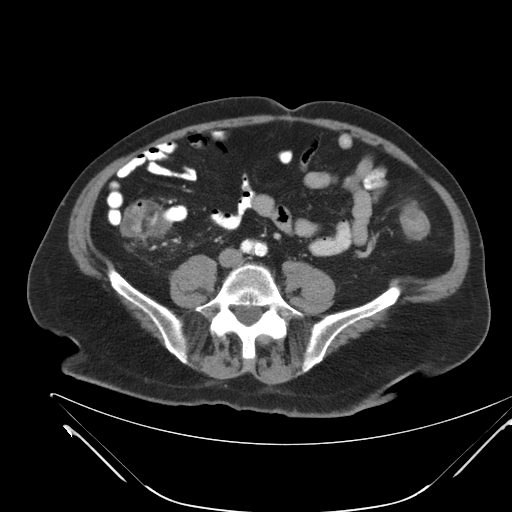
[im 55/104  soft-tissue]
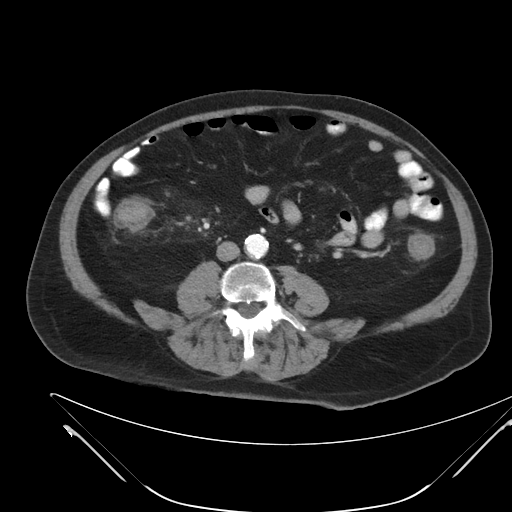
[im 62/104  soft-tissue]
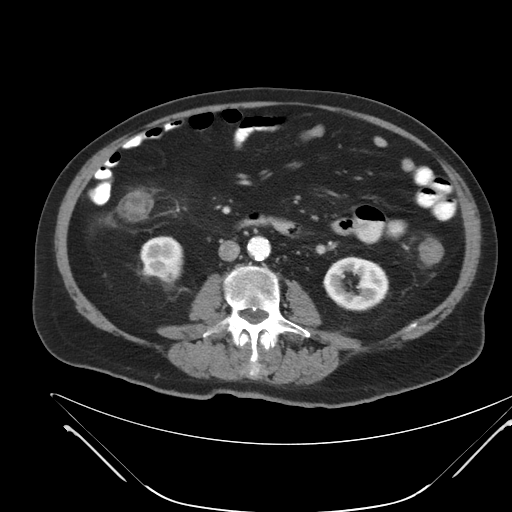
[im 62/104  bone]
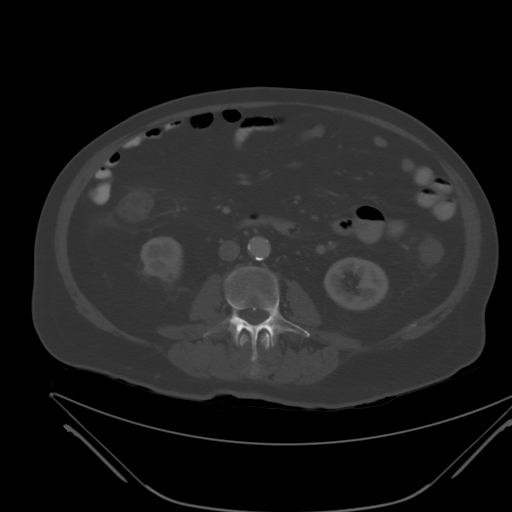
[im 69/104  soft-tissue]
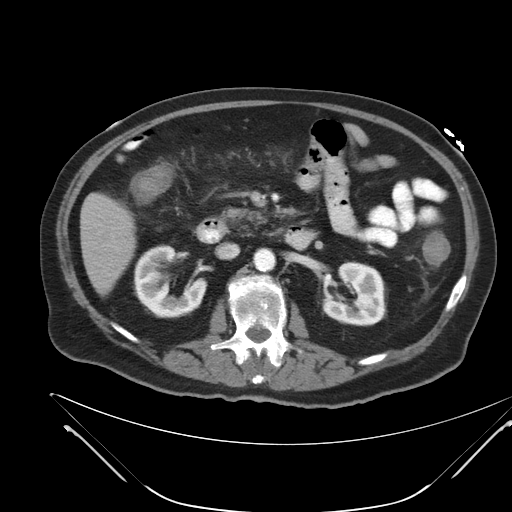
[im 76/104  soft-tissue]
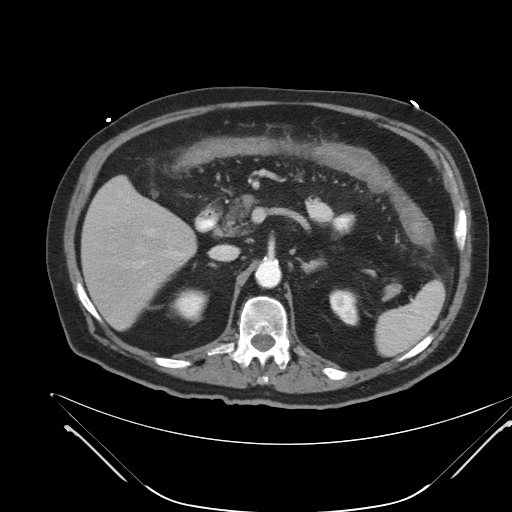
[im 83/104  soft-tissue]
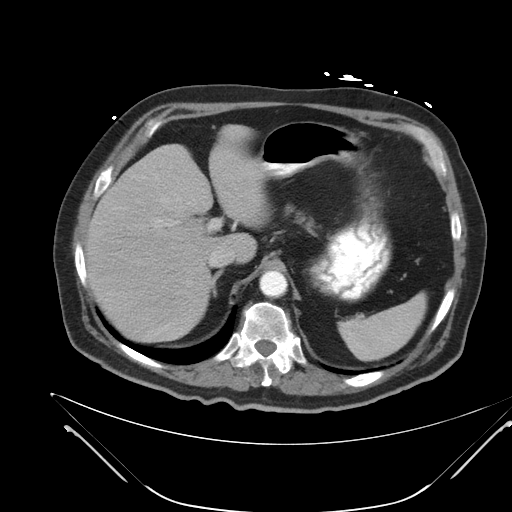
[im 90/104  soft-tissue]
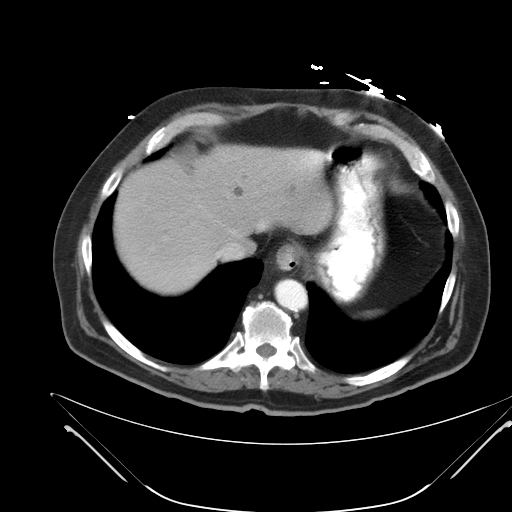
[im 97/104  soft-tissue]
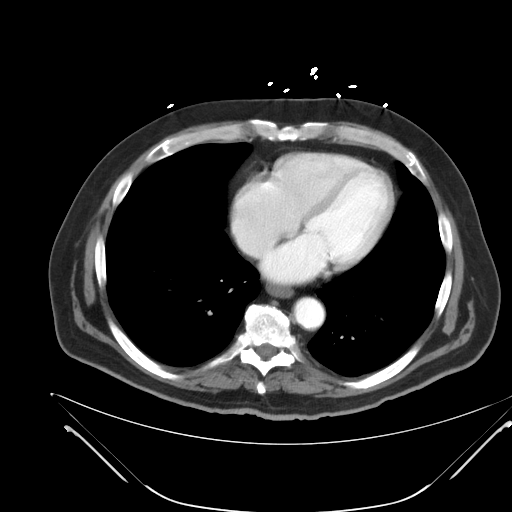

[coronals · coronal · 1.00mm/px · 3 of 111 slices shown]
[im 37/111  soft-tissue]
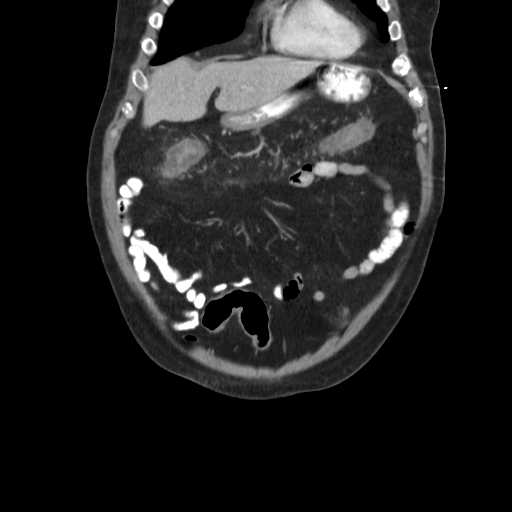
[im 49/111  soft-tissue]
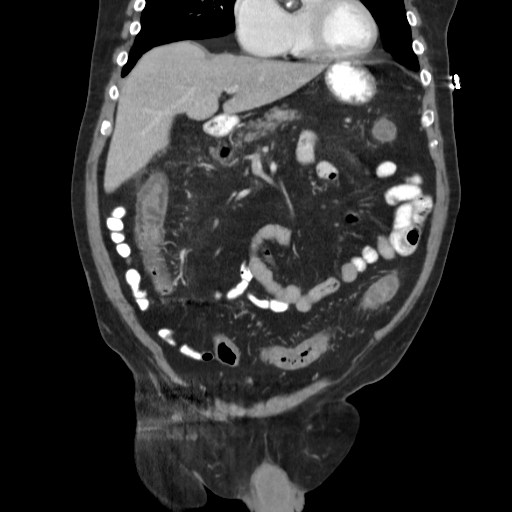
[im 62/111  soft-tissue]
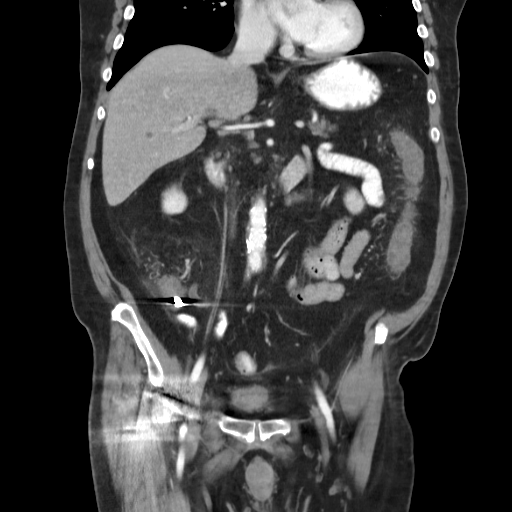

[17 of 46 positions shown; findings below may reference images not displayed]

FINDINGS: There is moderate diffuse colonic wall thickening with sparing of
terminal ileum and appendix. This is consistent with diffuse
colitis, likely pseudomembranous colitis or other infectious
etiology. There is no evidence of abscess, free fluid, or bowel
obstruction.

Several hepatic and renal cysts remains stable. No masses or
lymphadenopathy identified. Prior cholecystectomy noted. The
pancreas, spleen, and adrenal glands are normal in appearance. No
evidence of hydronephrosis. Right hip prosthesis results in beam
hardening artifact through the inferior pelvis. Moderately enlarged
prostate gland again noted, without change. An old L2 vertebral body
compression fracture is unchanged.
IMPRESSION: Moderate diffuse colitis, likely due to C difficile or other
infectious colitis. No evidence of abscess or other complication.

Stable moderately enlarged prostate and old L2 vertebral body
compression fracture.

## 2014-12-13 ENCOUNTER — Ambulatory Visit: Payer: Commercial Managed Care - HMO | Admitting: General Practice

## 2014-12-13 ENCOUNTER — Telehealth: Payer: Self-pay | Admitting: Internal Medicine

## 2014-12-13 DIAGNOSIS — Z5181 Encounter for therapeutic drug level monitoring: Secondary | ICD-10-CM

## 2014-12-13 LAB — PROTIME-INR: INR: 3 — AB (ref <=1.1)

## 2014-12-13 NOTE — Telephone Encounter (Signed)
Fax received from [facility] PT 32.2 and INR 3.0.  Results faxed to [facility].

## 2014-12-13 NOTE — Progress Notes (Signed)
Agree with plan 

## 2014-12-13 NOTE — Progress Notes (Signed)
Pre visit review using our clinic review tool, if applicable. No additional management support is needed unless otherwise documented below in the visit note. 

## 2014-12-13 NOTE — Telephone Encounter (Signed)
Noted  

## 2015-01-12 ENCOUNTER — Ambulatory Visit (INDEPENDENT_AMBULATORY_CARE_PROVIDER_SITE_OTHER): Payer: Commercial Managed Care - HMO | Admitting: Family Medicine

## 2015-01-12 ENCOUNTER — Encounter: Payer: Self-pay | Admitting: Family Medicine

## 2015-01-12 VITALS — BP 136/82 | HR 78 | Temp 98.1°F | Ht 74.0 in | Wt 208.9 lb

## 2015-01-12 DIAGNOSIS — R600 Localized edema: Secondary | ICD-10-CM

## 2015-01-12 NOTE — Progress Notes (Signed)
Pre visit review using our clinic review tool, if applicable. No additional management support is needed unless otherwise documented below in the visit note. 

## 2015-01-12 NOTE — Patient Instructions (Signed)
Use the compression socks every day  Elevate legs about waist for 30 minutes 1-2 times daily  Follow up if worsening, persists or other concerns

## 2015-01-12 NOTE — Progress Notes (Signed)
HPI:  Acute visit for:  Swelling in both ankles: -thinks started about 3 weeks ago - but has had this for a long time on and off and reports has compression socks which he does not use -he has been eating out a lot with a lot of sodium -denies: CP, SOB, DOE, palpiations, weight gain - weight is actually down 2 lbs from last visit -hx of CAD, CHF, HTN, HLD - reports sees cardiologist for this  ROS: See pertinent positives and negatives per HPI.  Past Medical History  Diagnosis Date  . CAD (coronary artery disease)   . GERD (gastroesophageal reflux disease)   . Hyperlipidemia   . Hypertension   . Atrial fibrillation   . Trigeminal neuralgia   . CHF (congestive heart failure)   . NSAID-induced gastric ulcer   . Blood transfusion without reported diagnosis   . GI bleed     Past Surgical History  Procedure Laterality Date  . Total knee arthroplasty  June 2009    Left  . Shoulder surgery      Left  . Cholecystectomy    . Hip surgery  August 2008    Right total hip replacement  . Esophagogastroduodenoscopy  2011  . Flexible sigmoidoscopy N/A 06/04/2013    Procedure: FLEXIBLE SIGMOIDOSCOPY;  Surgeon: Winfield Cunas., MD;  Location: [facility] ENDOSCOPY;  Service: Endoscopy;  Laterality: N/A;    No family history on file.  History   Social History  . Marital Status: Married    Spouse Name: N/A  . Number of Children: N/A  . Years of Education: N/A   Social History Main Topics  . Smoking status: Former Smoker    Quit date: 09/23/1958  . Smokeless tobacco: Former Systems developer     Comment: quit smoking1980-quit chewing 6 months  . Alcohol Use: No  . Drug Use: No  . Sexual Activity: Not on file   Other Topics Concern  . None   Social History Narrative     Current outpatient prescriptions:  .  acetaminophen (TYLENOL) 325 MG tablet, Take 650 mg by mouth every 4 (four) hours as needed. pain, Disp: , Rfl:  .  albuterol (PROAIR HFA) 108 (90 BASE) MCG/ACT inhaler, Inhale 2 puffs  into the lungs every 6 (six) hours as needed for wheezing or shortness of breath., Disp: 1 Inhaler, Rfl: 5 .  Ascorbic Acid (VITAMIN C) 500 MG tablet, Take 500 mg by mouth 2 (two) times daily. , Disp: , Rfl:  .  atorvastatin (LIPITOR) 40 MG tablet, TAKE 1 TABLET BY MOUTH EVERY NIGHT AT BEDTIME, Disp: 90 tablet, Rfl: 0 .  atorvastatin (LIPITOR) 40 MG tablet, TAKE 1 TABLET AT BEDTIME, Disp: 90 tablet, Rfl: 1 .  Bimatoprost (LUMIGAN) 0.01 % SOLN, Place 1 drop into both eyes at bedtime. Both eyes at hour of sleep, Disp: , Rfl:  .  Calcium Carbonate-Vitamin D (CALCIUM + D) 600-200 MG-UNIT per tablet, Take 1 tablet by mouth daily. , Disp: , Rfl:  .  carbamazepine (TEGRETOL) 200 MG tablet, TAKE 1 TABLET BY MOUTH TWICE DAILY, Disp: 180 tablet, Rfl: 1 .  clorazepate (TRANXENE) 3.75 MG tablet, Take 1 tablet (3.75 mg total) by mouth 3 (three) times daily as needed for anxiety., Disp: 90 tablet, Rfl: 0 .  Difluprednate (DUREZOL) 0.05 % EMUL, Place 1 drop into both eyes daily. , Disp: , Rfl:  .  docusate sodium (COLACE) 100 MG capsule, Take 100 mg by mouth 2 (two) times daily. , Disp: , Rfl:  .  fexofenadine (ALLEGRA) 180 MG tablet, Take 180 mg by mouth daily. , Disp: , Rfl:  .  ketoconazole (NIZORAL) 2 % shampoo, , Disp: , Rfl:  .  metoprolol tartrate (LOPRESSOR) 25 MG tablet, TAKE 1 TABLET BY MOUTH TWICE DAILY, Disp: 180 tablet, Rfl: 1 .  multivitamin (THERAGRAN) per tablet, Take 1 tablet by mouth daily. , Disp: , Rfl:  .  nitroGLYCERIN (NITROSTAT) 0.4 MG SL tablet, Place 0.4 mg under the tongue every 5 (five) minutes as needed for chest pain., Disp: , Rfl:  .  pantoprazole (PROTONIX) 40 MG tablet, TAKE 1 TABLET BY MOUTH EVERY DAY, Disp: 90 tablet, Rfl: 0 .  polyethylene glycol powder (GLYCOLAX/MIRALAX) powder, Take 17 g by mouth daily., Disp: 255 g, Rfl: 0 .  traMADol (ULTRAM) 50 MG tablet, Take 1 tablet (50 mg total) by mouth every 6 (six) hours as needed., Disp: 60 tablet, Rfl: 0 .  warfarin (COUMADIN) 5  MG tablet, Take 1 tablet (5 mg total) by mouth daily., Disp: 30 tablet, Rfl: 2  EXAM:  Filed Vitals:   01/12/15 1450  BP: 136/82  Pulse: 78  Temp: 98.1 F (36.7 C)    Body mass index is 26.81 kg/(m^2).  GENERAL: vitals reviewed and listed above, alert, oriented, appears well hydrated and in no acute distress  HEENT: atraumatic, conjunttiva clear, no obvious abnormalities on inspection of external nose and ears  NECK: no obvious masses on inspection  LUNGS: clear to auscultation bilaterally, no wheezes, rales or rhonchi, good air movement  CV: irr irr, bilat mild ankle edema  MS: moves all extremities without noticeable abnormality  PSYCH: pleasant and cooperative, no obvious depression or anxiety  ASSESSMENT AND PLAN:  Discussed the following assessment and plan:  Bilateral edema of lower extremity  -seems chronic per his report without weight gain or any symptoms or findings to suggest worsening CHF suspect venous insufficiency along with a healthy diet -he has compression socks - advised to use during the day and elevation -Patient advised to return or notify a doctor immediately if symptoms worsen or persist or new concerns arise.  Patient Instructions  Use the compression socks every day  Elevate legs about waist for 30 minutes 1-2 times daily  Follow up if worsening, persists or other concerns     KIM, HANNAH R.

## 2015-01-13 ENCOUNTER — Telehealth: Payer: Self-pay | Admitting: Family

## 2015-01-13 ENCOUNTER — Ambulatory Visit (INDEPENDENT_AMBULATORY_CARE_PROVIDER_SITE_OTHER): Payer: Commercial Managed Care - HMO | Admitting: Family

## 2015-01-13 DIAGNOSIS — I4891 Unspecified atrial fibrillation: Secondary | ICD-10-CM

## 2015-01-13 DIAGNOSIS — Z5181 Encounter for therapeutic drug level monitoring: Secondary | ICD-10-CM

## 2015-01-13 LAB — POCT INR: INR: 3.6

## 2015-01-13 NOTE — Patient Instructions (Signed)
Take 1/2 tab today only. Then Continue 1 tablet everyday.  Recheck in 4 weeks. Dosing instructions called to pt [phone].  Patient verbalized understanding.  Anticoagulation Dose Instructions as of 01/13/2015      Dorene Grebe Tue Wed Thu Fri Sat   New Dose 5 mg 5 mg 5 mg 5 mg 5 mg 5 mg 5 mg    Description        Take 1/2 tab today only. Then Continue 1 tablet everyday.  Recheck in 4 weeks. Dosing instructions called to pt [phone].  Patient verbalized understanding.

## 2015-01-13 NOTE — Telephone Encounter (Signed)
Left message to advise pt of INR results and instructions. See anti-coag note

## 2015-01-17 ENCOUNTER — Other Ambulatory Visit: Payer: Self-pay | Admitting: Internal Medicine

## 2015-01-17 ENCOUNTER — Telehealth: Payer: Self-pay | Admitting: Internal Medicine

## 2015-01-17 ENCOUNTER — Other Ambulatory Visit: Payer: Self-pay | Admitting: General Practice

## 2015-01-17 MED ORDER — WARFARIN SODIUM 5 MG PO TABS: 5.0000 mg | ORAL_TABLET | Freq: Every day | ORAL | Status: DC

## 2015-01-17 NOTE — Telephone Encounter (Signed)
Pt request refill of the following: warfarin (COUMADIN) 5 MG tablet   Phamacy: [facility] [place]

## 2015-02-16 ENCOUNTER — Telehealth: Payer: Self-pay | Admitting: *Deleted

## 2015-02-16 LAB — POCT INR: INR: 3.2

## 2015-02-16 LAB — PROTIME-INR

## 2015-02-16 NOTE — Telephone Encounter (Signed)
Received PT/INR results, reviewed by Dr.K he said continue present dose, no change, repeat in one month.  Left detailed message for pt on voicemail, to continue present dose of warfarin, no change and repeat PT/INR in one month and I will send orders. Please call if any questions.

## 2015-02-17 NOTE — Telephone Encounter (Signed)
Cindy, pt's PT/INR result came back and Dr.K reviewed. Pt aware no change, and repeat in one month. Can you please send orders for next PT/INR. Thanks.

## 2015-02-17 NOTE — Telephone Encounter (Signed)
Spoke to pt, said he did get my message about warfarin medication. Told him okay just wanted to make sure. Pt verbalized understanding.

## 2015-03-02 ENCOUNTER — Ambulatory Visit (INDEPENDENT_AMBULATORY_CARE_PROVIDER_SITE_OTHER): Payer: Commercial Managed Care - HMO | Admitting: General Practice

## 2015-03-02 DIAGNOSIS — I4891 Unspecified atrial fibrillation: Secondary | ICD-10-CM

## 2015-03-02 DIAGNOSIS — Z5181 Encounter for therapeutic drug level monitoring: Secondary | ICD-10-CM

## 2015-03-02 NOTE — Progress Notes (Signed)
Pre visit review using our clinic review tool, if applicable. No additional management support is needed unless otherwise documented below in the visit note. 

## 2015-03-09 ENCOUNTER — Encounter: Payer: Self-pay | Admitting: Internal Medicine

## 2015-03-09 ENCOUNTER — Ambulatory Visit (INDEPENDENT_AMBULATORY_CARE_PROVIDER_SITE_OTHER): Payer: Commercial Managed Care - HMO | Admitting: Internal Medicine

## 2015-03-09 VITALS — BP 120/80 | HR 69 | Temp 98.3°F | Resp 20 | Ht 74.0 in | Wt 212.0 lb

## 2015-03-09 DIAGNOSIS — I5020 Unspecified systolic (congestive) heart failure: Secondary | ICD-10-CM

## 2015-03-09 DIAGNOSIS — I10 Essential (primary) hypertension: Secondary | ICD-10-CM

## 2015-03-09 DIAGNOSIS — M159 Polyosteoarthritis, unspecified: Secondary | ICD-10-CM

## 2015-03-09 DIAGNOSIS — R7302 Impaired glucose tolerance (oral): Secondary | ICD-10-CM | POA: Diagnosis not present

## 2015-03-09 DIAGNOSIS — I482 Chronic atrial fibrillation, unspecified: Secondary | ICD-10-CM

## 2015-03-09 DIAGNOSIS — M150 Primary generalized (osteo)arthritis: Secondary | ICD-10-CM

## 2015-03-09 DIAGNOSIS — I5022 Chronic systolic (congestive) heart failure: Secondary | ICD-10-CM | POA: Diagnosis not present

## 2015-03-09 MED ORDER — FUROSEMIDE 20 MG PO TABS: 20.0000 mg | ORAL_TABLET | Freq: Every day | ORAL | Status: DC

## 2015-03-09 NOTE — Progress Notes (Signed)
Pre visit review using our clinic review tool, if applicable. No additional management support is needed unless otherwise documented below in the visit note. 

## 2015-03-09 NOTE — Progress Notes (Signed)
Subjective:    Patient ID: Eric [name], male    DOB: 1931/01/03, 81 y.o.   MRN: [id number]  HPI 81 year old patient who presents today complaining of left knee pain.  He is followed by orthopedics and is status post left total knee replacement surgery. He was seen 2 months ago with lower extremity edema.  This has improved slightly but he now is having some nocturnal cough and orthopnea.  He has chronic atrial fibrillation and history of congestive heart failure.  Remains on chronic Coumadin anticoagulation.  No diuretic therapy.  Past Medical History  Diagnosis Date  . CAD (coronary artery disease)   . GERD (gastroesophageal reflux disease)   . Hyperlipidemia   . Hypertension   . Atrial fibrillation   . Trigeminal neuralgia   . CHF (congestive heart failure)   . NSAID-induced gastric ulcer   . Blood transfusion without reported diagnosis   . GI bleed     History   Social History  . Marital Status: Married    Spouse Name: N/A  . Number of Children: N/A  . Years of Education: N/A   Occupational History  . Not on file.   Social History Main Topics  . Smoking status: Former Smoker    Quit date: 09/23/1958  . Smokeless tobacco: Former Systems developer     Comment: quit smoking1980-quit chewing 6 months  . Alcohol Use: No  . Drug Use: No  . Sexual Activity: Not on file   Other Topics Concern  . Not on file   Social History Narrative    Past Surgical History  Procedure Laterality Date  . Total knee arthroplasty  June 2009    Left  . Shoulder surgery      Left  . Cholecystectomy    . Hip surgery  August 2008    Right total hip replacement  . Esophagogastroduodenoscopy  2011  . Flexible sigmoidoscopy N/A 06/04/2013    Procedure: FLEXIBLE SIGMOIDOSCOPY;  Surgeon: Winfield Cunas., MD;  Location: [facility] ENDOSCOPY;  Service: Endoscopy;  Laterality: N/A;    No family history on file.  Allergies  Allergen Reactions  . Enoxaparin Sodium [Enoxaparin Sodium] Anaphylaxis  and Other (See Comments)    angioedema  . Codeine Other (See Comments)    hallucinations  . Oxycodone Hcl [Oxycodone Hcl] Other (See Comments)    hallucinations  . Vicodin [Hydrocodone-Acetaminophen] Other (See Comments)    hallucinations  . Sulfonamide Derivatives Rash    Current Outpatient Prescriptions on File Prior to Visit  Medication Sig Dispense Refill  . acetaminophen (TYLENOL) 325 MG tablet Take 650 mg by mouth every 4 (four) hours as needed. pain    . albuterol (PROAIR HFA) 108 (90 BASE) MCG/ACT inhaler Inhale 2 puffs into the lungs every 6 (six) hours as needed for wheezing or shortness of breath. 1 Inhaler 5  . Ascorbic Acid (VITAMIN C) 500 MG tablet Take 500 mg by mouth 2 (two) times daily.     Marland Kitchen atorvastatin (LIPITOR) 40 MG tablet TAKE 1 TABLET BY MOUTH EVERY NIGHT AT BEDTIME 90 tablet 0  . atorvastatin (LIPITOR) 40 MG tablet TAKE 1 TABLET AT BEDTIME 90 tablet 1  . Bimatoprost (LUMIGAN) 0.01 % SOLN Place 1 drop into both eyes at bedtime. Both eyes at hour of sleep    . Calcium Carbonate-Vitamin D (CALCIUM + D) 600-200 MG-UNIT per tablet Take 1 tablet by mouth daily.     . carbamazepine (TEGRETOL) 200 MG tablet TAKE 1 TABLET BY MOUTH TWICE  DAILY 180 tablet 1  . clorazepate (TRANXENE) 3.75 MG tablet Take 1 tablet (3.75 mg total) by mouth 3 (three) times daily as needed for anxiety. 90 tablet 0  . Difluprednate (DUREZOL) 0.05 % EMUL Place 1 drop into both eyes daily.     Marland Kitchen docusate sodium (COLACE) 100 MG capsule Take 100 mg by mouth 2 (two) times daily.     . fexofenadine (ALLEGRA) 180 MG tablet Take 180 mg by mouth daily.     Marland Kitchen ketoconazole (NIZORAL) 2 % shampoo     . metoprolol tartrate (LOPRESSOR) 25 MG tablet TAKE 1 TABLET BY MOUTH TWICE DAILY 180 tablet 1  . multivitamin (THERAGRAN) per tablet Take 1 tablet by mouth daily.     . nitroGLYCERIN (NITROSTAT) 0.4 MG SL tablet Place 0.4 mg under the tongue every 5 (five) minutes as needed for chest pain.    . pantoprazole  (PROTONIX) 40 MG tablet TAKE 1 TABLET BY MOUTH EVERY DAY 90 tablet 0  . polyethylene glycol powder (GLYCOLAX/MIRALAX) powder Take 17 g by mouth daily. 255 g 0  . traMADol (ULTRAM) 50 MG tablet Take 1 tablet (50 mg total) by mouth every 6 (six) hours as needed. 60 tablet 0  . warfarin (COUMADIN) 5 MG tablet Take 1 tablet (5 mg total) by mouth daily. 30 tablet 3   No current facility-administered medications on file prior to visit.    BP 120/80 mmHg  Pulse 69  Temp(Src) 98.3 F (36.8 C) (Oral)  Resp 20  Ht 6\' 2"  (1.88 m)  Wt 212 lb (96.163 kg)  BMI 27.21 kg/m2  SpO2 98%      Review of Systems  Constitutional: Negative for fever, chills, appetite change and fatigue.  HENT: Negative for congestion, dental problem, ear pain, hearing loss, sore throat, tinnitus, trouble swallowing and voice change.   Eyes: Negative for pain, discharge and visual disturbance.  Respiratory: Positive for cough. Negative for chest tightness, wheezing and stridor.   Cardiovascular: Positive for leg swelling. Negative for chest pain and palpitations.  Gastrointestinal: Negative for nausea, vomiting, abdominal pain, diarrhea, constipation, blood in stool and abdominal distention.  Genitourinary: Negative for urgency, hematuria, flank pain, discharge, difficulty urinating and genital sores.  Musculoskeletal: Positive for joint swelling and arthralgias. Negative for myalgias, back pain, gait problem and neck stiffness.  Skin: Negative for rash.  Neurological: Negative for dizziness, syncope, speech difficulty, weakness, numbness and headaches.  Hematological: Negative for adenopathy. Does not bruise/bleed easily.  Psychiatric/Behavioral: Negative for behavioral problems and dysphoric mood. The patient is not nervous/anxious.        Objective:   Physical Exam  Constitutional: He is oriented to person, place, and time. He appears well-developed.  HENT:  Head: Normocephalic.  Right Ear: External ear normal.    Left Ear: External ear normal.  Eyes: Conjunctivae and EOM are normal.  Neck: Normal range of motion.  Cardiovascular: Normal rate and normal heart sounds.   Controlled ventricular response  Pulmonary/Chest: He has rales.  Bibasilar rales O2 saturation 98  Abdominal: Bowel sounds are normal.  Musculoskeletal: Normal range of motion. He exhibits edema. He exhibits no tenderness.  Status post left total knee replacement surgery Left knee painful and slightly warm to touch  Plus 1 lower extremity edema  Neurological: He is alert and oriented to person, place, and time.  Psychiatric: He has a normal mood and affect. His behavior is normal.          Assessment & Plan:   Osteoarthritis, left  knee status post left total knee replacement surgery.  Follow-up orthopedics Chronic atrial fibrillation. Hypertension History congestive heart failure.  Mild fluid overload.  Will start furosemide 20 Impaired glucose tolerance.  We'll check a hemoglobin A1c  Lab update

## 2015-03-09 NOTE — Patient Instructions (Signed)
Limit your sodium (Salt) intake  Furosemide 20 mg every morning  Orthopedic follow-up

## 2015-03-10 ENCOUNTER — Telehealth: Payer: Self-pay | Admitting: Internal Medicine

## 2015-03-10 LAB — COMPREHENSIVE METABOLIC PANEL
ALT: 14 U/L (ref 0–53)
AST: 26 U/L (ref 0–37)
Albumin: 4.2 g/dL (ref 3.5–5.2)
Alkaline Phosphatase: 71 U/L (ref 39–117)
BUN: 15 mg/dL (ref 6–23)
CO2: 29 mEq/L (ref 19–32)
Calcium: 9.1 mg/dL (ref 8.4–10.5)
Chloride: 105 mEq/L (ref 96–112)
Creatinine, Ser: 0.74 mg/dL (ref 0.40–1.50)
GFR: 107.74 mL/min (ref >60.00)
Glucose, Bld: 98 mg/dL (ref 70–99)
POTASSIUM: 4.2 meq/L (ref 3.5–5.1)
Sodium: 139 mEq/L (ref 135–145)
Total Bilirubin: 0.8 mg/dL (ref 0.2–1.2)
Total Protein: 6.6 g/dL (ref 6.0–8.3)

## 2015-03-10 LAB — CBC WITH DIFFERENTIAL/PLATELET
BASOS ABS: 0 10*3/uL (ref 0.0–0.1)
BASOS PCT: 0.4 % (ref 0.0–3.0)
Eosinophils Absolute: 0.2 10*3/uL (ref 0.0–0.7)
Eosinophils Relative: 2.1 % (ref 0.0–5.0)
HEMATOCRIT: 40.3 % (ref 39.0–52.0)
HEMOGLOBIN: 13.1 g/dL (ref 13.0–17.0)
Lymphocytes Relative: 19.5 % (ref 12.0–46.0)
Lymphs Abs: 1.4 10*3/uL (ref 0.7–4.0)
MCHC: 32.5 g/dL (ref 30.0–36.0)
MCV: 92.5 fl (ref 78.0–100.0)
Monocytes Absolute: 0.6 10*3/uL (ref 0.1–1.0)
Monocytes Relative: 8.6 % (ref 3.0–12.0)
Neutro Abs: 5.1 10*3/uL (ref 1.4–7.7)
Neutrophils Relative %: 69.4 % (ref 43.0–77.0)
Platelets: 136 10*3/uL — ABNORMAL LOW (ref 150.0–400.0)
RBC: 4.35 Mil/uL (ref 4.22–5.81)
RDW: 15.9 % — ABNORMAL HIGH (ref 11.5–15.5)
WBC: 7.4 10*3/uL (ref 4.0–10.5)

## 2015-03-10 LAB — HEMOGLOBIN A1C: HEMOGLOBIN A1C: 6.2 % (ref 4.6–6.5)

## 2015-03-10 LAB — TSH: TSH: 1.03 u[IU]/mL (ref 0.35–4.50)

## 2015-03-10 LAB — BRAIN NATRIURETIC PEPTIDE: Pro B Natriuretic peptide (BNP): 275 pg/mL — ABNORMAL HIGH (ref 0.0–100.0)

## 2015-03-10 NOTE — Telephone Encounter (Signed)
Tried to contact pt no answer will try again later. 

## 2015-03-10 NOTE — Telephone Encounter (Signed)
Spoke to pt, asked if Dr.k sent something in for his cough at night? Told him no, but you can try OTC Delsym cough syrup and make sure you do your inhaler before going to bed also that might help. Pt verbalized understanding.

## 2015-03-10 NOTE — Telephone Encounter (Signed)
Pt would like a call back about some medicine .

## 2015-04-06 ENCOUNTER — Ambulatory Visit: Payer: Commercial Managed Care - HMO | Admitting: Internal Medicine

## 2015-04-06 ENCOUNTER — Telehealth: Payer: Self-pay | Admitting: *Deleted

## 2015-04-06 LAB — PROTIME-INR: INR: 3 — AB (ref 0.9–1.1)

## 2015-04-06 NOTE — Telephone Encounter (Signed)
Report put in scan box.

## 2015-04-06 NOTE — Telephone Encounter (Signed)
Left detailed message for pt on voicemail received lab report from [facility] PT/INR. Dr. Raliegh Ip reviewed and said normal range continue present dose. Call if any questions.

## 2015-05-04 LAB — PROTIME-INR: INR: 3 — AB (ref 0.9–1.1)

## 2015-05-05 ENCOUNTER — Telehealth: Payer: Self-pay | Admitting: Internal Medicine

## 2015-05-05 NOTE — Telephone Encounter (Signed)
Pt needs to know what he should do about is inr. Pt has done at [place] results were faxed last nite and given to [facility].

## 2015-05-05 NOTE — Telephone Encounter (Signed)
Received fax late evening of PT of 30.0 and INR of 3.0. Reviewed results with MD [place] and he advised everything was within normal range. Attempted to call patient but no voicemail was set up. Attempted again this afternoon and still just rang. Will attempt again this afternoon to contact patient.

## 2015-05-05 NOTE — Telephone Encounter (Signed)
Called and updated patient

## 2015-05-08 ENCOUNTER — Ambulatory Visit (INDEPENDENT_AMBULATORY_CARE_PROVIDER_SITE_OTHER): Payer: Commercial Managed Care - HMO | Admitting: General Practice

## 2015-05-08 DIAGNOSIS — Z5181 Encounter for therapeutic drug level monitoring: Secondary | ICD-10-CM

## 2015-05-08 NOTE — Progress Notes (Signed)
Pre visit review using our clinic review tool, if applicable. No additional management support is needed unless otherwise documented below in the visit note. 

## 2015-05-26 ENCOUNTER — Other Ambulatory Visit: Payer: Self-pay | Admitting: *Deleted

## 2015-05-26 ENCOUNTER — Telehealth: Payer: Self-pay | Admitting: Internal Medicine

## 2015-05-26 ENCOUNTER — Other Ambulatory Visit: Payer: Self-pay | Admitting: Internal Medicine

## 2015-05-26 MED ORDER — WARFARIN SODIUM 5 MG PO TABS: 5.0000 mg | ORAL_TABLET | Freq: Every day | ORAL | Status: DC

## 2015-05-26 NOTE — Telephone Encounter (Signed)
Rx sent in

## 2015-05-26 NOTE — Telephone Encounter (Signed)
Pt needs refill on warfarin 5 mg#30 send to [facility]. Pt will be out on Monday 05-29-15

## 2015-06-01 ENCOUNTER — Ambulatory Visit (INDEPENDENT_AMBULATORY_CARE_PROVIDER_SITE_OTHER): Payer: Commercial Managed Care - HMO | Admitting: Internal Medicine

## 2015-06-01 ENCOUNTER — Encounter: Payer: Self-pay | Admitting: Internal Medicine

## 2015-06-01 VITALS — BP 128/80 | HR 67 | Temp 97.5°F | Resp 20 | Ht 74.0 in | Wt 211.0 lb

## 2015-06-01 DIAGNOSIS — M159 Polyosteoarthritis, unspecified: Secondary | ICD-10-CM

## 2015-06-01 DIAGNOSIS — M150 Primary generalized (osteo)arthritis: Secondary | ICD-10-CM

## 2015-06-01 DIAGNOSIS — Z23 Encounter for immunization: Secondary | ICD-10-CM | POA: Diagnosis not present

## 2015-06-01 DIAGNOSIS — R7302 Impaired glucose tolerance (oral): Secondary | ICD-10-CM

## 2015-06-01 DIAGNOSIS — I482 Chronic atrial fibrillation, unspecified: Secondary | ICD-10-CM

## 2015-06-01 DIAGNOSIS — I10 Essential (primary) hypertension: Secondary | ICD-10-CM

## 2015-06-01 NOTE — Addendum Note (Signed)
Addended by: Marian Sorrow on: 06/01/2015 10:25 AM   Modules accepted: Orders

## 2015-06-01 NOTE — Progress Notes (Signed)
Pre visit review using our clinic review tool, if applicable. No additional management support is needed unless otherwise documented below in the visit note. 

## 2015-06-01 NOTE — Progress Notes (Signed)
Subjective:    Patient ID: Eric [name], male    DOB: Apr 15, 1931, 81 y.o.   MRN: [id number]  HPI  81 year old patient who is seen today for his six-month follow-up.  He has a history of chronic atrial fibrillation and remains on chronic Coumadin anticoagulation.  He has essential hypertension and mild dyslipidemia.  He has coronary artery disease and a history congestive heart failure.  He generally has been quite stable except for arthritic complaints.  He has had a fall recently and has persistent left knee pain.  No cardiopulmonary complaints. Remains on Coumadin and statin therapy.  Past Medical History  Diagnosis Date  . CAD (coronary artery disease)   . GERD (gastroesophageal reflux disease)   . Hyperlipidemia   . Hypertension   . Atrial fibrillation   . Trigeminal neuralgia   . CHF (congestive heart failure)   . NSAID-induced gastric ulcer   . Blood transfusion without reported diagnosis   . GI bleed     Social History   Social History  . Marital Status: Married    Spouse Name: N/A  . Number of Children: N/A  . Years of Education: N/A   Occupational History  . Not on file.   Social History Main Topics  . Smoking status: Former Smoker    Quit date: 09/23/1958  . Smokeless tobacco: Former Systems developer     Comment: quit smoking1980-quit chewing 6 months  . Alcohol Use: No  . Drug Use: No  . Sexual Activity: Not on file   Other Topics Concern  . Not on file   Social History Narrative    Past Surgical History  Procedure Laterality Date  . Total knee arthroplasty  June 2009    Left  . Shoulder surgery      Left  . Cholecystectomy    . Hip surgery  August 2008    Right total hip replacement  . Esophagogastroduodenoscopy  2011  . Flexible sigmoidoscopy N/A 06/04/2013    Procedure: FLEXIBLE SIGMOIDOSCOPY;  Surgeon: Winfield Cunas., MD;  Location: [facility] ENDOSCOPY;  Service: Endoscopy;  Laterality: N/A;    No family history on file.  Allergies  Allergen  Reactions  . Enoxaparin Sodium [Enoxaparin Sodium] Anaphylaxis and Other (See Comments)    angioedema  . Codeine Other (See Comments)    hallucinations  . Oxycodone Hcl [Oxycodone Hcl] Other (See Comments)    hallucinations  . Vicodin [Hydrocodone-Acetaminophen] Other (See Comments)    hallucinations  . Sulfonamide Derivatives Rash    Current Outpatient Prescriptions on File Prior to Visit  Medication Sig Dispense Refill  . acetaminophen (TYLENOL) 325 MG tablet Take 650 mg by mouth every 4 (four) hours as needed. pain    . albuterol (PROAIR HFA) 108 (90 BASE) MCG/ACT inhaler Inhale 2 puffs into the lungs every 6 (six) hours as needed for wheezing or shortness of breath. 1 Inhaler 5  . Ascorbic Acid (VITAMIN C) 500 MG tablet Take 500 mg by mouth 2 (two) times daily.     Marland Kitchen atorvastatin (LIPITOR) 40 MG tablet TAKE 1 TABLET AT BEDTIME 90 tablet 1  . Bimatoprost (LUMIGAN) 0.01 % SOLN Place 1 drop into both eyes at bedtime. Both eyes at hour of sleep    . Calcium Carbonate-Vitamin D (CALCIUM + D) 600-200 MG-UNIT per tablet Take 1 tablet by mouth daily.     . carbamazepine (TEGRETOL) 200 MG tablet TAKE 1 TABLET BY MOUTH TWICE DAILY 180 tablet 1  . clorazepate (TRANXENE) 3.75 MG  tablet Take 1 tablet (3.75 mg total) by mouth 3 (three) times daily as needed for anxiety. 90 tablet 0  . Difluprednate (DUREZOL) 0.05 % EMUL Place 1 drop into both eyes daily.     Marland Kitchen docusate sodium (COLACE) 100 MG capsule Take 100 mg by mouth 2 (two) times daily.     . fexofenadine (ALLEGRA) 180 MG tablet Take 180 mg by mouth daily.     . furosemide (LASIX) 20 MG tablet Take 1 tablet (20 mg total) by mouth daily. 30 tablet 3  . ketoconazole (NIZORAL) 2 % shampoo     . metoprolol tartrate (LOPRESSOR) 25 MG tablet TAKE 1 TABLET BY MOUTH TWICE DAILY 180 tablet 1  . multivitamin (THERAGRAN) per tablet Take 1 tablet by mouth daily.     . nitroGLYCERIN (NITROSTAT) 0.4 MG SL tablet Place 0.4 mg under the tongue every 5 (five)  minutes as needed for chest pain.    . pantoprazole (PROTONIX) 40 MG tablet TAKE 1 TABLET BY MOUTH EVERY DAY 90 tablet 0  . polyethylene glycol powder (GLYCOLAX/MIRALAX) powder Take 17 g by mouth daily. 255 g 0  . traMADol (ULTRAM) 50 MG tablet Take 1 tablet (50 mg total) by mouth every 6 (six) hours as needed. 60 tablet 0  . warfarin (COUMADIN) 5 MG tablet Take 1 tablet (5 mg total) by mouth daily. 30 tablet 3   No current facility-administered medications on file prior to visit.    BP 128/80 mmHg  Pulse 67  Temp(Src) 97.5 F (36.4 C) (Oral)  Resp 20  Ht 6\' 2"  (1.88 m)  Wt 211 lb (95.709 kg)  BMI 27.08 kg/m2  SpO2 98%     Review of Systems  Constitutional: Negative for fever, chills, appetite change and fatigue.  HENT: Negative for congestion, dental problem, ear pain, hearing loss, sore throat, tinnitus, trouble swallowing and voice change.   Eyes: Negative for pain, discharge and visual disturbance.  Respiratory: Negative for cough, chest tightness, wheezing and stridor.   Cardiovascular: Positive for leg swelling. Negative for chest pain and palpitations.  Gastrointestinal: Negative for nausea, vomiting, abdominal pain, diarrhea, constipation, blood in stool and abdominal distention.  Genitourinary: Negative for urgency, hematuria, flank pain, discharge, difficulty urinating and genital sores.  Musculoskeletal: Positive for arthralgias and gait problem. Negative for myalgias, back pain, joint swelling and neck stiffness.  Skin: Negative for rash.  Neurological: Negative for dizziness, syncope, speech difficulty, weakness, numbness and headaches.  Hematological: Negative for adenopathy. Does not bruise/bleed easily.  Psychiatric/Behavioral: Negative for behavioral problems and dysphoric mood. The patient is not nervous/anxious.        Objective:   Physical Exam  Constitutional: He is oriented to person, place, and time. He appears well-developed.  HENT:  Head:  Normocephalic.  Right Ear: External ear normal.  Left Ear: External ear normal.  Eyes: Conjunctivae and EOM are normal.  Neck: Normal range of motion.  Cardiovascular: Normal rate and normal heart sounds.   Irregular rhythm with controlled ventricular response  Pulmonary/Chest: Effort normal. He has rales.  Bibasilar rales  Abdominal: Bowel sounds are normal.  Musculoskeletal: Normal range of motion. He exhibits edema. He exhibits no tenderness.  Mild left ankle edema  Walks with a cane due to left knee pain  Neurological: He is alert and oriented to person, place, and time.  Psychiatric: He has a normal mood and affect. His behavior is normal.          Assessment & Plan:   Chronic atrial  fibrillation.  We'll continue chronic anticoagulation Hypertension, stable Osteoarthritis with left knee pain Coronary artery disease, stable Dyslipidemia.  Continue statin therapy  CPX 6 months

## 2015-06-01 NOTE — Patient Instructions (Signed)
Limit your sodium (Salt) intake  You need to lose weight.  Consider a lower calorie diet and regular exercise.  Return in 6 months for follow-up   

## 2015-06-02 ENCOUNTER — Telehealth: Payer: Self-pay | Admitting: *Deleted

## 2015-06-02 LAB — PROTIME-INR: INR: 3.4 — AB (ref <=1.1)

## 2015-06-02 NOTE — Telephone Encounter (Signed)
Spoke to pt, told him Dr.K reviewed his PT/INR and he would like you to hold your coumadin for 2 days and then resume regular dosage and repeat PT/INR in 4 weeks. Pt verbalized understanding.

## 2015-06-05 ENCOUNTER — Ambulatory Visit (INDEPENDENT_AMBULATORY_CARE_PROVIDER_SITE_OTHER): Payer: Commercial Managed Care - HMO | Admitting: General Practice

## 2015-06-05 DIAGNOSIS — Z5181 Encounter for therapeutic drug level monitoring: Secondary | ICD-10-CM

## 2015-06-05 NOTE — Progress Notes (Signed)
Pre visit review using our clinic review tool, if applicable. No additional management support is needed unless otherwise documented below in the visit note. 

## 2015-06-30 ENCOUNTER — Ambulatory Visit (INDEPENDENT_AMBULATORY_CARE_PROVIDER_SITE_OTHER): Payer: Commercial Managed Care - HMO | Admitting: Family

## 2015-06-30 ENCOUNTER — Telehealth: Payer: Self-pay | Admitting: Internal Medicine

## 2015-06-30 DIAGNOSIS — I482 Chronic atrial fibrillation, unspecified: Secondary | ICD-10-CM

## 2015-06-30 DIAGNOSIS — Z5181 Encounter for therapeutic drug level monitoring: Secondary | ICD-10-CM

## 2015-06-30 LAB — POCT INR: INR: 4.3

## 2015-06-30 NOTE — Telephone Encounter (Signed)
Patient is aware, per [facility], hold coumadin for 2 days 06/30/15-07/01/15.  Then decrease dosage to 5 mg daily and 2.5 mg Tuesday and Thursday.  Patient should have PT/INR checked in 10 days.

## 2015-06-30 NOTE — Telephone Encounter (Signed)
Pt is calling would like the result of his protime. Pt had protime check yesterday around 3 pm at [place].

## 2015-07-05 ENCOUNTER — Telehealth: Payer: Self-pay | Admitting: Internal Medicine

## 2015-07-05 DIAGNOSIS — Z008 Encounter for other general examination: Secondary | ICD-10-CM

## 2015-07-05 NOTE — Telephone Encounter (Signed)
Pt call to ask for a referral to [place]   Reason ;  To get toe nails cut

## 2015-07-10 NOTE — Telephone Encounter (Signed)
Order for referral to Podiatry done.

## 2015-07-17 ENCOUNTER — Encounter: Payer: Self-pay | Admitting: Podiatry

## 2015-07-17 ENCOUNTER — Ambulatory Visit (INDEPENDENT_AMBULATORY_CARE_PROVIDER_SITE_OTHER): Payer: Commercial Managed Care - HMO | Admitting: Podiatry

## 2015-07-17 DIAGNOSIS — B351 Tinea unguium: Secondary | ICD-10-CM

## 2015-07-17 DIAGNOSIS — M79676 Pain in unspecified toe(s): Secondary | ICD-10-CM

## 2015-07-17 NOTE — Progress Notes (Signed)
   Subjective:    Patient ID: Eric [name], male    DOB: 04/14/1931, 81 y.o.   MRN: [id number]  HPIThis patient presents to the office with long thick painful nails.  He says the nails are painful walking and wearing his shoes.  He has been unable to  Treat himself.  He presents for  Preventive foot care services.  Patient is here today for toenail debridement.  Review of Systems  All other systems reviewed and are negative.      Objective:   Physical Exam GENERAL APPEARANCE: Alert, conversant. Appropriately groomed. No acute distress.  VASCULAR: Pedal pulses palpable at  [facility] and PT bilateral.  Capillary refill time is immediate to all digits,  Normal temperature gradient.  Digital hair growth is present bilateral  NEUROLOGIC: sensation is normal to 5.07 monofilament at 5/5 sites bilateral.  Light touch is intact bilateral, Muscle strength normal.  MUSCULOSKELETAL: acceptable muscle strength, tone and stability bilateral.  Intrinsic muscluature intact bilateral.  Rectus appearance of foot and digits noted bilateral.   DERMATOLOGIC: skin color, texture, and turgor are within normal limits.  No preulcerative lesions or ulcers  are seen, no interdigital maceration noted.  No open lesions present.  . No drainage noted.  NAILS  Thick disfigured discolored nails with subungual debris noted.         Assessment & Plan:  Onychomycosis B/L  IE  Debridement and grinding of long painful nails  RTC 3 months.

## 2015-07-20 LAB — PROTIME-INR: INR: 2.8 — AB (ref <=1.1)

## 2015-07-21 ENCOUNTER — Ambulatory Visit (INDEPENDENT_AMBULATORY_CARE_PROVIDER_SITE_OTHER): Payer: Commercial Managed Care - HMO | Admitting: General Practice

## 2015-07-21 DIAGNOSIS — Z5181 Encounter for therapeutic drug level monitoring: Secondary | ICD-10-CM

## 2015-07-21 NOTE — Progress Notes (Signed)
I have reviewed and agree with the plan. 

## 2015-07-21 NOTE — Progress Notes (Signed)
Pre visit review using our clinic review tool, if applicable. No additional management support is needed unless otherwise documented below in the visit note. 

## 2015-08-01 ENCOUNTER — Other Ambulatory Visit: Payer: Self-pay | Admitting: Internal Medicine

## 2015-08-02 ENCOUNTER — Other Ambulatory Visit: Payer: Self-pay | Admitting: General Practice

## 2015-08-02 MED ORDER — WARFARIN SODIUM 5 MG PO TABS: ORAL_TABLET | ORAL | Status: DC

## 2015-08-31 ENCOUNTER — Ambulatory Visit (INDEPENDENT_AMBULATORY_CARE_PROVIDER_SITE_OTHER): Payer: Commercial Managed Care - HMO | Admitting: General Practice

## 2015-08-31 LAB — PROTIME-INR: INR: 3.5 — AB (ref <=1.1)

## 2015-08-31 NOTE — Progress Notes (Signed)
Pre visit review using our clinic review tool, if applicable. No additional management support is needed unless otherwise documented below in the visit note. 

## 2015-09-14 DIAGNOSIS — Z96652 Presence of left artificial knee joint: Secondary | ICD-10-CM

## 2015-09-14 HISTORY — DX: Presence of left artificial knee joint: Z96.652

## 2015-10-13 LAB — PROTIME-INR: INR: 3.4 — AB (ref <=1.1)

## 2015-10-16 ENCOUNTER — Ambulatory Visit (INDEPENDENT_AMBULATORY_CARE_PROVIDER_SITE_OTHER): Payer: Commercial Managed Care - HMO | Admitting: General Practice

## 2015-10-16 ENCOUNTER — Ambulatory Visit: Payer: Self-pay | Admitting: Podiatry

## 2015-10-16 DIAGNOSIS — I4891 Unspecified atrial fibrillation: Secondary | ICD-10-CM

## 2015-10-16 DIAGNOSIS — Z5181 Encounter for therapeutic drug level monitoring: Secondary | ICD-10-CM

## 2015-10-16 NOTE — Progress Notes (Signed)
Pre visit review using our clinic review tool, if applicable. No additional management support is needed unless otherwise documented below in the visit note. 

## 2015-11-13 ENCOUNTER — Telehealth: Payer: Self-pay | Admitting: Internal Medicine

## 2015-11-13 LAB — PROTIME-INR: INR: 3.1 — AB (ref 0.9–1.1)

## 2015-11-13 NOTE — Telephone Encounter (Signed)
Faxed new standing orders to [facility] to check pt/inr.

## 2015-11-13 NOTE — Telephone Encounter (Signed)
Christy call from [facility] call standing order PTINR   She said pt will be coming in today around 3pm    Order can be fax to the following number [phone]

## 2015-11-14 ENCOUNTER — Telehealth: Payer: Self-pay | Admitting: Internal Medicine

## 2015-11-14 ENCOUNTER — Ambulatory Visit (INDEPENDENT_AMBULATORY_CARE_PROVIDER_SITE_OTHER): Payer: Commercial Managed Care - HMO | Admitting: General Practice

## 2015-11-14 DIAGNOSIS — I4891 Unspecified atrial fibrillation: Secondary | ICD-10-CM

## 2015-11-14 DIAGNOSIS — Z5181 Encounter for therapeutic drug level monitoring: Secondary | ICD-10-CM

## 2015-11-14 NOTE — Telephone Encounter (Signed)
Pt would like coumadin results from [place] [facility]. Pt had lab yesterday

## 2015-11-14 NOTE — Telephone Encounter (Signed)
INR results reported to patient. 

## 2015-11-14 NOTE — Progress Notes (Signed)
I have reviewed and agree with the plan. 

## 2015-11-14 NOTE — Progress Notes (Signed)
Pre visit review using our clinic review tool, if applicable. No additional management support is needed unless otherwise documented below in the visit note. 

## 2015-11-17 ENCOUNTER — Encounter: Payer: Self-pay | Admitting: Internal Medicine

## 2015-11-17 ENCOUNTER — Ambulatory Visit (INDEPENDENT_AMBULATORY_CARE_PROVIDER_SITE_OTHER): Payer: Medicare HMO | Admitting: Internal Medicine

## 2015-11-17 VITALS — BP 130/78 | HR 72 | Temp 97.9°F | Resp 20 | Ht 74.0 in | Wt 210.0 lb

## 2015-11-17 DIAGNOSIS — M159 Polyosteoarthritis, unspecified: Secondary | ICD-10-CM

## 2015-11-17 DIAGNOSIS — R2681 Unsteadiness on feet: Secondary | ICD-10-CM

## 2015-11-17 DIAGNOSIS — M150 Primary generalized (osteo)arthritis: Secondary | ICD-10-CM | POA: Diagnosis not present

## 2015-11-17 DIAGNOSIS — I10 Essential (primary) hypertension: Secondary | ICD-10-CM | POA: Diagnosis not present

## 2015-11-17 DIAGNOSIS — I480 Paroxysmal atrial fibrillation: Secondary | ICD-10-CM | POA: Diagnosis not present

## 2015-11-17 NOTE — Progress Notes (Signed)
Pre visit review using our clinic review tool, if applicable. No additional management support is needed unless otherwise documented below in the visit note. 

## 2015-11-17 NOTE — Progress Notes (Signed)
Subjective:    Patient ID: Eric [name], male    DOB: 25-Oct-1930, 82 y.o.   MRN: [id number]  HPI  82 year old patient who has a history of essential hypertension. He has a history of significant osteoarthritis with significant left hip pain.  He is seen today for medical clearance prior to elective hip surgery He is followed by [facility] cardiology (Dr Bettina Gavia) who performed a nuclear stress test 24 months ago.  This suggested a reversible inferolateral defect consistent with ischemia.  The patient denies any exertional chest pain or shortness of breath, but is quite limited by his unsteady gait and left hip pain.  He uses a walker He has chronic atrial fibrillation.  Remains on chronic Coumadin anticoagulation.  He has a prior history of trigeminal neuralgia and has been on Tegretol once daily.  He denies any recent facial pain  Past Medical History  Diagnosis Date  . CAD (coronary artery disease)   . GERD (gastroesophageal reflux disease)   . Hyperlipidemia   . Hypertension   . Atrial fibrillation ([place])   . Trigeminal neuralgia   . CHF (congestive heart failure) ([place])   . NSAID-induced gastric ulcer   . Blood transfusion without reported diagnosis   . GI bleed     Social History   Social History  . Marital Status: Married    Spouse Name: N/A  . Number of Children: N/A  . Years of Education: N/A   Occupational History  . Not on file.   Social History Main Topics  . Smoking status: Former Smoker    Quit date: 09/23/1958  . Smokeless tobacco: Former Systems developer     Comment: quit smoking1980-quit chewing 6 months  . Alcohol Use: No  . Drug Use: No  . Sexual Activity: Not on file   Other Topics Concern  . Not on file   Social History Narrative    Past Surgical History  Procedure Laterality Date  . Total knee arthroplasty  June 2009    Left  . Shoulder surgery      Left  . Cholecystectomy    . Hip surgery  August 2008    Right total hip replacement  .  Esophagogastroduodenoscopy  2011  . Flexible sigmoidoscopy N/A 06/04/2013    Procedure: FLEXIBLE SIGMOIDOSCOPY;  Surgeon: Winfield Cunas., MD;  Location: [facility] ENDOSCOPY;  Service: Endoscopy;  Laterality: N/A;    No family history on file.  Allergies  Allergen Reactions  . Enoxaparin Sodium [Enoxaparin Sodium] Anaphylaxis and Other (See Comments)    angioedema  . Codeine Other (See Comments)    hallucinations  . Oxycodone Hcl [Oxycodone Hcl] Other (See Comments)    hallucinations  . Vicodin [Hydrocodone-Acetaminophen] Other (See Comments)    hallucinations  . Sulfonamide Derivatives Rash    Current Outpatient Prescriptions on File Prior to Visit  Medication Sig Dispense Refill  . acetaminophen (TYLENOL) 325 MG tablet Take 650 mg by mouth every 4 (four) hours as needed. pain    . albuterol (PROAIR HFA) 108 (90 BASE) MCG/ACT inhaler Inhale 2 puffs into the lungs every 6 (six) hours as needed for wheezing or shortness of breath. 1 Inhaler 5  . Ascorbic Acid (VITAMIN C) 500 MG tablet Take 500 mg by mouth 2 (two) times daily.     Marland Kitchen atorvastatin (LIPITOR) 40 MG tablet TAKE 1 TABLET AT BEDTIME 90 tablet 1  . Bimatoprost (LUMIGAN) 0.01 % SOLN Place 1 drop into both eyes at bedtime. Both eyes at hour of  sleep    . Calcium Carbonate-Vitamin D (CALCIUM + D) 600-200 MG-UNIT per tablet Take 1 tablet by mouth daily.     . clorazepate (TRANXENE) 3.75 MG tablet Take 1 tablet (3.75 mg total) by mouth 3 (three) times daily as needed for anxiety. 90 tablet 0  . Difluprednate (DUREZOL) 0.05 % EMUL Place 1 drop into both eyes daily.     Marland Kitchen docusate sodium (COLACE) 100 MG capsule Take 100 mg by mouth 2 (two) times daily.     . fexofenadine (ALLEGRA) 180 MG tablet Take 180 mg by mouth daily.     . furosemide (LASIX) 20 MG tablet Take 1 tablet (20 mg total) by mouth daily. 30 tablet 3  . ketoconazole (NIZORAL) 2 % shampoo     . metoprolol tartrate (LOPRESSOR) 25 MG tablet TAKE 1 TABLET BY MOUTH TWICE DAILY  180 tablet 1  . multivitamin (THERAGRAN) per tablet Take 1 tablet by mouth daily.     . nitroGLYCERIN (NITROSTAT) 0.4 MG SL tablet Place 0.4 mg under the tongue every 5 (five) minutes as needed for chest pain.    Marland Kitchen nystatin-triamcinolone (MYCOLOG II) cream Apply 1 application topically 2 (two) times daily.    . pantoprazole (PROTONIX) 40 MG tablet TAKE 1 TABLET BY MOUTH EVERY DAY 90 tablet 0  . polyethylene glycol powder (GLYCOLAX/MIRALAX) powder Take 17 g by mouth daily. 255 g 0  . traMADol (ULTRAM) 50 MG tablet Take 1 tablet (50 mg total) by mouth every 6 (six) hours as needed. 60 tablet 0  . warfarin (COUMADIN) 5 MG tablet Take as directed by anticoagulation clinic. 90 tablet 1   No current facility-administered medications on file prior to visit.    BP 130/78 mmHg  Pulse 72  Temp(Src) 97.9 F (36.6 C) (Oral)  Resp 20  Ht 6\' 2"  (1.88 m)  Wt 210 lb (95.255 kg)  BMI 26.95 kg/m2  SpO2 97%     Review of Systems  Constitutional: Negative for fever, chills, appetite change and fatigue.  HENT: Negative for congestion, dental problem, ear pain, hearing loss, sore throat, tinnitus, trouble swallowing and voice change.   Eyes: Negative for pain, discharge and visual disturbance.  Respiratory: Negative for cough, chest tightness, wheezing and stridor.   Cardiovascular: Negative for chest pain, palpitations and leg swelling.  Gastrointestinal: Negative for nausea, vomiting, abdominal pain, diarrhea, constipation, blood in stool and abdominal distention.  Genitourinary: Negative for urgency, hematuria, flank pain, discharge, difficulty urinating and genital sores.  Musculoskeletal: Positive for arthralgias and gait problem. Negative for myalgias, back pain, joint swelling and neck stiffness.  Skin: Negative for rash.  Neurological: Negative for dizziness, syncope, speech difficulty, weakness, numbness and headaches.  Hematological: Negative for adenopathy. Does not bruise/bleed easily.    Psychiatric/Behavioral: Negative for behavioral problems and dysphoric mood. The patient is not nervous/anxious.        Objective:   Physical Exam  Constitutional: He is oriented to person, place, and time. He appears well-developed.  Blood pressure well controlled  HENT:  Head: Normocephalic.  Right Ear: External ear normal.  Left Ear: External ear normal.  Eyes: Conjunctivae and EOM are normal.  Neck: Normal range of motion.  Cardiovascular: Normal rate and normal heart sounds.   Irregular rhythm with controlled ventricular response  Pulmonary/Chest: He has rales.  Bibasal rales, right greater than left  Abdominal: Bowel sounds are normal.  Musculoskeletal: Normal range of motion. He exhibits no edema or tenderness.  Neurological: He is alert and oriented to  person, place, and time.  Psychiatric: He has a normal mood and affect. His behavior is normal.          Assessment & Plan:   Chronic atrial fibrillation Chronic Coumadin anticoagulation Symptomatic left hip osteoarthritis History of abnormal nuclear stress test.  Will follow-up with Dr. Bettina Gavia for cardiac preoperative clearance History trigeminal neuralgia.  Patient has been on once daily, Tegretol will discontinue  Return here in 4-6 months No change in medical regimen except discontinuation of Tegretol

## 2015-11-17 NOTE — Patient Instructions (Signed)
Limit your sodium (Salt) intake  Cardiology follow-up as scheduled  Attempt to taper and discontinue Tegretol (Carbamazepine)  Return in 3 months for follow-up

## 2015-11-30 ENCOUNTER — Ambulatory Visit: Payer: Commercial Managed Care - HMO | Admitting: Internal Medicine

## 2015-12-01 ENCOUNTER — Ambulatory Visit: Payer: Commercial Managed Care - HMO | Admitting: Internal Medicine

## 2015-12-10 DIAGNOSIS — Z01810 Encounter for preprocedural cardiovascular examination: Secondary | ICD-10-CM | POA: Insufficient documentation

## 2015-12-13 ENCOUNTER — Other Ambulatory Visit: Payer: Self-pay | Admitting: Internal Medicine

## 2015-12-14 ENCOUNTER — Telehealth: Payer: Self-pay | Admitting: Internal Medicine

## 2015-12-14 NOTE — Telephone Encounter (Signed)
Pt would like to know if Dr Raliegh Ip has signed the papers to help him get Erlene Quan paid off.

## 2015-12-14 NOTE — Telephone Encounter (Signed)
I located the form pt was referring to in [place]. Dr Raliegh Ip had already filled this out and it states it was faxed and mailed to pt.  I mailed pt another copy.

## 2015-12-14 NOTE — Telephone Encounter (Signed)
Eric [name], I have no idea what pt is talking about I have not seen any papers.

## 2015-12-15 NOTE — Telephone Encounter (Signed)
Okay, thanks

## 2015-12-19 ENCOUNTER — Ambulatory Visit: Payer: Commercial Managed Care - HMO | Admitting: Internal Medicine

## 2015-12-26 ENCOUNTER — Telehealth: Payer: Self-pay | Admitting: Internal Medicine

## 2015-12-26 NOTE — Telephone Encounter (Signed)
Wife states pt is at [facility] after his hip surgery. Every since then pt has had the hiccups and is SOB. Wife states the dr who operated on pt (Dr Salvadore Farber) advised her to call his medical doctor and let him know.  Ask her if she has told the dr at [facility]. She did not know.

## 2015-12-26 NOTE — Telephone Encounter (Signed)
Spoke to pt's wife Eric [name], told her she needs to let the Doctor at the Nursing Home know what is going on with pt. Tell the nurse and she will let the doctor know. Eric [name] verbalized understanding and said she did tell the nurse and she was going to let doctor know. Told her to follow up with and make sure doctor is aware. Eric [name] verbalized understanding.

## 2015-12-26 NOTE — Telephone Encounter (Signed)
FYI

## 2016-01-10 ENCOUNTER — Telehealth: Payer: Self-pay | Admitting: Internal Medicine

## 2016-01-10 NOTE — Telephone Encounter (Signed)
Eric [name], if you can put two slots together to bring pt in sooner, that will be fine. Pt will need to take medications as prescribed till he sees Dr.K.

## 2016-01-10 NOTE — Telephone Encounter (Signed)
Wife states pt is being dc'd on Friday from rehab.  She states pt is on a bunch of medicine he does not need at the rehab and wants to   see Dr Raliegh Ip asap.  Pt has appointment on Thursday, 4/27 and home health will be coming out as well. Please advise.

## 2016-01-12 NOTE — Telephone Encounter (Signed)
Put 2 slots together on Monday, thanks

## 2016-01-15 ENCOUNTER — Encounter: Payer: Self-pay | Admitting: Internal Medicine

## 2016-01-15 ENCOUNTER — Ambulatory Visit (INDEPENDENT_AMBULATORY_CARE_PROVIDER_SITE_OTHER): Payer: Medicare HMO | Admitting: Internal Medicine

## 2016-01-15 VITALS — BP 100/60 | HR 82 | Temp 97.9°F | Resp 20 | Ht 74.0 in | Wt 203.0 lb

## 2016-01-15 DIAGNOSIS — I482 Chronic atrial fibrillation, unspecified: Secondary | ICD-10-CM

## 2016-01-15 DIAGNOSIS — M150 Primary generalized (osteo)arthritis: Secondary | ICD-10-CM

## 2016-01-15 DIAGNOSIS — R7302 Impaired glucose tolerance (oral): Secondary | ICD-10-CM | POA: Diagnosis not present

## 2016-01-15 DIAGNOSIS — I2510 Atherosclerotic heart disease of native coronary artery without angina pectoris: Secondary | ICD-10-CM

## 2016-01-15 DIAGNOSIS — M159 Polyosteoarthritis, unspecified: Secondary | ICD-10-CM

## 2016-01-15 DIAGNOSIS — K219 Gastro-esophageal reflux disease without esophagitis: Secondary | ICD-10-CM | POA: Diagnosis not present

## 2016-01-15 LAB — HEMOGLOBIN A1C: HEMOGLOBIN A1C: 6.4 % (ref 4.6–6.5)

## 2016-01-15 NOTE — Patient Instructions (Signed)
Limit your sodium (Salt) intake    It is important that you exercise regularly, at least 20 minutes 3 to 4 times per week.  If you develop chest pain or shortness of breath seek  medical attention.  Return in 3 months for follow-up  

## 2016-01-15 NOTE — Progress Notes (Signed)
Subjective:    Patient ID: Eric [name], male    DOB: [birth year], 82 y.o.   MRN: [id number]  HPI  82 year old patient seen for follow-up.  Since his last visit here, he has had left total hip replacement surgery and required transfer to a rehabilitation facility.  He has been home for the past 3 days and doing well.  He is immature with a walker.  She is having some left knee pain He is now on metformin 500 mg daily.  He does have a history of impaired glucose tolerance. He has coronary artery disease which has been stable. He has remote history of gastric ulcer disease and is on chronic Coumadin anticoagulation as well as PPI therapy.  He has essential hypertension which has been well-controlled.  Past Medical History  Diagnosis Date  . CAD (coronary artery disease)   . GERD (gastroesophageal reflux disease)   . Hyperlipidemia   . Hypertension   . Atrial fibrillation ([place])   . Trigeminal neuralgia   . CHF (congestive heart failure) ([place])   . NSAID-induced gastric ulcer   . Blood transfusion without reported diagnosis   . GI bleed      Social History   Social History  . Marital Status: Married    Spouse Name: N/A  . Number of Children: N/A  . Years of Education: N/A   Occupational History  . Not on file.   Social History Main Topics  . Smoking status: Former Smoker    Quit date: 09/23/1958  . Smokeless tobacco: Former Systems developer     Comment: quit smoking1980-quit chewing 6 months  . Alcohol Use: No  . Drug Use: No  . Sexual Activity: Not on file   Other Topics Concern  . Not on file   Social History Narrative    Past Surgical History  Procedure Laterality Date  . Total knee arthroplasty  June 2009    Left  . Shoulder surgery      Left  . Cholecystectomy    . Hip surgery  August 2008    Right total hip replacement  . Esophagogastroduodenoscopy  2011  . Flexible sigmoidoscopy N/A 06/04/2013    Procedure: FLEXIBLE SIGMOIDOSCOPY;  Surgeon: Winfield Cunas., MD;  Location: [facility] ENDOSCOPY;  Service: Endoscopy;  Laterality: N/A;    No family history on file.  Allergies  Allergen Reactions  . Enoxaparin Sodium [Enoxaparin Sodium] Anaphylaxis and Other (See Comments)    angioedema  . Codeine Other (See Comments)    hallucinations  . Oxycodone Hcl [Oxycodone Hcl] Other (See Comments)    hallucinations  . Vicodin [Hydrocodone-Acetaminophen] Other (See Comments)    hallucinations  . Sulfonamide Derivatives Rash    Current Outpatient Prescriptions on File Prior to Visit  Medication Sig Dispense Refill  . acetaminophen (TYLENOL) 325 MG tablet Take 650 mg by mouth every 4 (four) hours as needed. pain    . albuterol (PROAIR HFA) 108 (90 BASE) MCG/ACT inhaler Inhale 2 puffs into the lungs every 6 (six) hours as needed for wheezing or shortness of breath. 1 Inhaler 5  . Ascorbic Acid (VITAMIN C) 500 MG tablet Take 500 mg by mouth 2 (two) times daily.     Marland Kitchen atorvastatin (LIPITOR) 40 MG tablet TAKE 1 TABLET AT BEDTIME 90 tablet 1  . Bimatoprost (LUMIGAN) 0.01 % SOLN Place 1 drop into both eyes at bedtime. Both eyes at hour of sleep    . Calcium Carbonate-Vitamin D (CALCIUM + D) 600-200 MG-UNIT per  tablet Take 1 tablet by mouth daily.     . clorazepate (TRANXENE) 3.75 MG tablet Take 1 tablet (3.75 mg total) by mouth 3 (three) times daily as needed for anxiety. 90 tablet 0  . Difluprednate (DUREZOL) 0.05 % EMUL Place 1 drop into both eyes daily.     Marland Kitchen docusate sodium (COLACE) 100 MG capsule Take 100 mg by mouth 2 (two) times daily.     . fexofenadine (ALLEGRA) 180 MG tablet Take 180 mg by mouth daily.     Marland Kitchen ketoconazole (NIZORAL) 2 % shampoo     . metoprolol tartrate (LOPRESSOR) 25 MG tablet TAKE 1 TABLET BY MOUTH TWICE DAILY 180 tablet 1  . multivitamin (THERAGRAN) per tablet Take 1 tablet by mouth daily.     . nitroGLYCERIN (NITROSTAT) 0.4 MG SL tablet Place 0.4 mg under the tongue every 5 (five) minutes as needed for chest pain.    Marland Kitchen  nystatin-triamcinolone (MYCOLOG II) cream Apply 1 application topically 2 (two) times daily.    . pantoprazole (PROTONIX) 40 MG tablet TAKE 1 TABLET BY MOUTH EVERY DAY 90 tablet 0  . polyethylene glycol powder (GLYCOLAX/MIRALAX) powder Take 17 g by mouth daily. 255 g 0  . traMADol (ULTRAM) 50 MG tablet Take 1 tablet (50 mg total) by mouth every 6 (six) hours as needed. 60 tablet 0  . warfarin (COUMADIN) 5 MG tablet Take as directed by anticoagulation clinic. 90 tablet 1   No current facility-administered medications on file prior to visit.    BP 100/60 mmHg  Pulse 82  Temp(Src) 97.9 F (36.6 C) (Oral)  Resp 20  Ht 6\' 2"  (1.88 m)  Wt 203 lb (92.08 kg)  BMI 26.05 kg/m2  SpO2 97%     Review of Systems  Constitutional: Negative for fever, chills, appetite change and fatigue.  HENT: Negative for congestion, dental problem, ear pain, hearing loss, sore throat, tinnitus, trouble swallowing and voice change.   Eyes: Negative for pain, discharge and visual disturbance.  Respiratory: Negative for cough, chest tightness, wheezing and stridor.   Cardiovascular: Negative for chest pain, palpitations and leg swelling.  Gastrointestinal: Negative for nausea, vomiting, abdominal pain, diarrhea, constipation, blood in stool and abdominal distention.  Genitourinary: Negative for urgency, hematuria, flank pain, discharge, difficulty urinating and genital sores.  Musculoskeletal: Positive for gait problem. Negative for myalgias, back pain, joint swelling, arthralgias and neck stiffness.       Left knee pain  Skin: Negative for rash.  Neurological: Negative for dizziness, syncope, speech difficulty, weakness, numbness and headaches.  Hematological: Negative for adenopathy. Does not bruise/bleed easily.  Psychiatric/Behavioral: Negative for behavioral problems and dysphoric mood. The patient is not nervous/anxious.        Objective:   Physical Exam  Constitutional: He is oriented to person, place,  and time. He appears well-developed.  Wheelchair bound Blood pressure low normal  HENT:  Head: Normocephalic.  Right Ear: External ear normal.  Left Ear: External ear normal.  Eyes: Conjunctivae and EOM are normal.  Neck: Normal range of motion.  Cardiovascular: Normal rate and normal heart sounds.   Irregular rhythm with controlled ventricular response  Pulmonary/Chest: He has rales.  Abdominal: Bowel sounds are normal.  Musculoskeletal: Normal range of motion. He exhibits no edema or tenderness.  Neurological: He is alert and oriented to person, place, and time.  Psychiatric: He has a normal mood and affect. His behavior is normal.          Assessment & Plan:  chronic atrial fibrillation , chronic Coumadin anticoagulation , essential hypertension , status post left total hip replacement surgery  CAD , remote history gastric ulcer  GERD.  Continue PPI therapy .  Impaired glucose tolerance.  We'll check a hemoglobin A1c  .  Recheck 3 months

## 2016-01-18 ENCOUNTER — Telehealth: Payer: Self-pay | Admitting: Internal Medicine

## 2016-01-18 ENCOUNTER — Ambulatory Visit: Payer: Medicare HMO | Admitting: Internal Medicine

## 2016-01-18 NOTE — Telephone Encounter (Signed)
Home health nurse Nira Conn calling to request the following:  1.  Refill of Warfarin 3mg  tablets sent to [facility] in [place].   2. Needs to now when patient is due for next INR check.  Patient thought he had INR level checked at his visit on 01/15/16.  Per Nira Conn, patient has states he normally has his INR level check in [place].  3.  Needs verbal order for skilled nursing ( 1 time a week for 3 weeks & every other week for 6 weeks).  Please call Heather back at [phone].

## 2016-01-19 ENCOUNTER — Other Ambulatory Visit: Payer: Self-pay | Admitting: General Practice

## 2016-01-19 ENCOUNTER — Telehealth: Payer: Self-pay | Admitting: General Practice

## 2016-01-19 LAB — PROTIME-INR: INR: 1.5 — AB (ref 0.9–1.1)

## 2016-01-19 MED ORDER — WARFARIN SODIUM 5 MG PO TABS: ORAL_TABLET | ORAL | Status: DC

## 2016-01-19 MED ORDER — WARFARIN SODIUM 3 MG PO TABS: ORAL_TABLET | ORAL | Status: DC

## 2016-01-19 NOTE — Telephone Encounter (Signed)
Spoke with patient and he is going to [facility] today for pt/inr.  Warfarin called to [facility].

## 2016-01-22 ENCOUNTER — Telehealth: Payer: Self-pay | Admitting: Internal Medicine

## 2016-01-22 NOTE — Telephone Encounter (Signed)
Santiago Glad with [facility] request verbal OT orders for  1 X week / X 3  increase pt independence with self care and transfer.

## 2016-01-22 NOTE — Telephone Encounter (Signed)
Left detailed message on personal voicemail for Santiago Glad, verbal orders given for OT 1 x a week for 3 weeks for pt okay per Dr.K. Any questions please call office.

## 2016-01-22 NOTE — Telephone Encounter (Signed)
Left detailed message on Heather's voicemail, verbal order for skilled nursing 1 time a week for 3 weeks & every other week for 6 weeks for pt, okay per Dr. Raliegh Ip. Any questions please call office.

## 2016-01-23 ENCOUNTER — Telehealth: Payer: Self-pay | Admitting: General Practice

## 2016-01-23 ENCOUNTER — Telehealth: Payer: Self-pay | Admitting: Internal Medicine

## 2016-01-23 ENCOUNTER — Ambulatory Visit (INDEPENDENT_AMBULATORY_CARE_PROVIDER_SITE_OTHER): Payer: Medicare HMO | Admitting: General Practice

## 2016-01-23 DIAGNOSIS — Z5181 Encounter for therapeutic drug level monitoring: Secondary | ICD-10-CM

## 2016-01-23 DIAGNOSIS — I4891 Unspecified atrial fibrillation: Secondary | ICD-10-CM

## 2016-01-23 NOTE — Telephone Encounter (Signed)
Left VO to check patient's INR on Tuesday, 5/9 and to call results to Villa Herb, RN @ [phone].

## 2016-01-23 NOTE — Telephone Encounter (Signed)
Dosing instructions given to patient. 

## 2016-01-23 NOTE — Telephone Encounter (Signed)
Pt would like to know his coumadin results from [facility] .  Pt had blood drawn on 01-19-16

## 2016-01-23 NOTE — Progress Notes (Signed)
Pre visit review using our clinic review tool, if applicable. No additional management support is needed unless otherwise documented below in the visit note. 

## 2016-01-31 ENCOUNTER — Ambulatory Visit (INDEPENDENT_AMBULATORY_CARE_PROVIDER_SITE_OTHER): Payer: Medicare HMO | Admitting: General Practice

## 2016-01-31 DIAGNOSIS — Z5181 Encounter for therapeutic drug level monitoring: Secondary | ICD-10-CM

## 2016-01-31 DIAGNOSIS — I4891 Unspecified atrial fibrillation: Secondary | ICD-10-CM

## 2016-01-31 LAB — POCT INR: INR: 3.2

## 2016-01-31 NOTE — Progress Notes (Signed)
Pre visit review using our clinic review tool, if applicable. No additional management support is needed unless otherwise documented below in the visit note. 

## 2016-02-05 LAB — POCT INR: INR: 2.8

## 2016-02-06 ENCOUNTER — Telehealth: Payer: Self-pay | Admitting: Internal Medicine

## 2016-02-06 ENCOUNTER — Ambulatory Visit (INDEPENDENT_AMBULATORY_CARE_PROVIDER_SITE_OTHER): Payer: Medicare HMO | Admitting: General Practice

## 2016-02-06 DIAGNOSIS — I4891 Unspecified atrial fibrillation: Secondary | ICD-10-CM

## 2016-02-06 DIAGNOSIS — Z5181 Encounter for therapeutic drug level monitoring: Secondary | ICD-10-CM

## 2016-02-06 NOTE — Telephone Encounter (Signed)
INR results reported to patient. 

## 2016-02-06 NOTE — Telephone Encounter (Signed)
Pt would like coumadin blood work results

## 2016-02-06 NOTE — Telephone Encounter (Signed)
Heather with [facility] called to ensure you received fax yesterday with pt's pt results.  Inr:  2.8 Pt: 29.7  Also would like you to know Janeece Riggers will be discharging today.  Pt will need to resume coum checks at [place] states pt is aware,

## 2016-02-06 NOTE — Progress Notes (Signed)
Pre visit review using our clinic review tool, if applicable. No additional management support is needed unless otherwise documented below in the visit note. 

## 2016-02-12 ENCOUNTER — Telehealth: Payer: Self-pay | Admitting: Internal Medicine

## 2016-02-12 NOTE — Telephone Encounter (Signed)
Okay to continue metformin.  Please call in a new prescription

## 2016-02-12 NOTE — Telephone Encounter (Signed)
Last A1C was 6.4  On 01/15/16 Also seen for follow up rehab on the same day.

## 2016-02-12 NOTE — Telephone Encounter (Signed)
Pt was put on metFORMIN (GLUCOPHAGE-XR) 500 MG 24 hr tablet  When he was in rehab for his hip surgery. Pt has one tab left and wants to know if he should continue?  Pt had never been on this med before.  His follow up appointment is not until Friday , May 26. [facility]/ [address]

## 2016-02-13 ENCOUNTER — Other Ambulatory Visit: Payer: Self-pay | Admitting: Internal Medicine

## 2016-02-13 MED ORDER — METFORMIN HCL ER 500 MG PO TB24: 500.0000 mg | ORAL_TABLET | Freq: Every day | ORAL | Status: DC

## 2016-02-13 NOTE — Telephone Encounter (Signed)
Spoke to the pt and confirmed he is taking 1 tablet daily.  Sent in a 30 day supply to the pharmacy.  Pt should be coming in soon for a follow up.

## 2016-02-15 NOTE — Telephone Encounter (Signed)
Spoke to pt, told him you do not have to check your sugars. When you come in to see Dr.K he will check your sugar. Pt verbalized understanding.

## 2016-02-15 NOTE — Telephone Encounter (Signed)
Pt states he read that he needs to check his blood sugar if you are on metformin.  Pt was put on this in rehab. Dr Raliegh Ip refilled 30 days and he  has follow up on 6/5.  Pt would like a call back.

## 2016-02-16 ENCOUNTER — Ambulatory Visit: Payer: Medicare HMO | Admitting: Internal Medicine

## 2016-02-26 ENCOUNTER — Encounter: Payer: Self-pay | Admitting: Internal Medicine

## 2016-02-26 ENCOUNTER — Ambulatory Visit (INDEPENDENT_AMBULATORY_CARE_PROVIDER_SITE_OTHER): Payer: Medicare HMO | Admitting: Internal Medicine

## 2016-02-26 VITALS — BP 120/70 | HR 81 | Temp 98.0°F | Resp 20 | Ht 74.0 in | Wt 208.0 lb

## 2016-02-26 DIAGNOSIS — I2510 Atherosclerotic heart disease of native coronary artery without angina pectoris: Secondary | ICD-10-CM | POA: Diagnosis not present

## 2016-02-26 DIAGNOSIS — M159 Polyosteoarthritis, unspecified: Secondary | ICD-10-CM

## 2016-02-26 DIAGNOSIS — I482 Chronic atrial fibrillation: Secondary | ICD-10-CM

## 2016-02-26 DIAGNOSIS — I4821 Permanent atrial fibrillation: Secondary | ICD-10-CM

## 2016-02-26 DIAGNOSIS — R7302 Impaired glucose tolerance (oral): Secondary | ICD-10-CM

## 2016-02-26 DIAGNOSIS — M150 Primary generalized (osteo)arthritis: Secondary | ICD-10-CM | POA: Diagnosis not present

## 2016-02-26 DIAGNOSIS — I10 Essential (primary) hypertension: Secondary | ICD-10-CM | POA: Diagnosis not present

## 2016-02-26 NOTE — Progress Notes (Signed)
Subjective:    Patient ID: Mateo Flow, male    DOB: 12/12/1930, 82 y.o.   MRN: [id number]  HPI  Lab Results  Component Value Date   HGBA1C 6.4 01/15/2016   82 year old patient who is seen today in follow-up.  He has a history of permanent atrial fibrillation.  Remains on Coumadin anticoagulation.  He has essential hypertension. Since his last visit here, he has had left hip replacement surgery and has done quite well.  He has some residual but improving left leg edema. He has a history of impaired glucose tolerance and was placed on metformin therapy while in rehabilitation.  No new concerns or complaints. He uses a cane  periodically  Past Medical History  Diagnosis Date  . CAD (coronary artery disease)   . GERD (gastroesophageal reflux disease)   . Hyperlipidemia   . Hypertension   . Atrial fibrillation ([place])   . Trigeminal neuralgia   . CHF (congestive heart failure) ([place])   . NSAID-induced gastric ulcer   . Blood transfusion without reported diagnosis   . GI bleed      Social History   Social History  . Marital Status: Married    Spouse Name: N/A  . Number of Children: N/A  . Years of Education: N/A   Occupational History  . Not on file.   Social History Main Topics  . Smoking status: Former Smoker    Quit date: 09/23/1958  . Smokeless tobacco: Former Systems developer     Comment: quit smoking1980-quit chewing 6 months  . Alcohol Use: No  . Drug Use: No  . Sexual Activity: Not on file   Other Topics Concern  . Not on file   Social History Narrative    Past Surgical History  Procedure Laterality Date  . Total knee arthroplasty  June 2009    Left  . Shoulder surgery      Left  . Cholecystectomy    . Hip surgery  August 2008    Right total hip replacement  . Esophagogastroduodenoscopy  2011  . Flexible sigmoidoscopy N/A 06/04/2013    Procedure: FLEXIBLE SIGMOIDOSCOPY;  Surgeon: Winfield Cunas., MD;  Location: [facility] ENDOSCOPY;  Service: Endoscopy;   Laterality: N/A;    No family history on file.  Allergies  Allergen Reactions  . Enoxaparin Sodium [Enoxaparin Sodium] Anaphylaxis and Other (See Comments)    angioedema  . Codeine Other (See Comments)    hallucinations  . Oxycodone Hcl [Oxycodone Hcl] Other (See Comments)    hallucinations  . Vicodin [Hydrocodone-Acetaminophen] Other (See Comments)    hallucinations  . Sulfonamide Derivatives Rash    Current Outpatient Prescriptions on File Prior to Visit  Medication Sig Dispense Refill  . acetaminophen (TYLENOL) 325 MG tablet Take 650 mg by mouth every 4 (four) hours as needed. pain    . albuterol (PROAIR HFA) 108 (90 BASE) MCG/ACT inhaler Inhale 2 puffs into the lungs every 6 (six) hours as needed for wheezing or shortness of breath. 1 Inhaler 5  . atorvastatin (LIPITOR) 40 MG tablet TAKE 1 TABLET AT BEDTIME 90 tablet 1  . Bimatoprost (LUMIGAN) 0.01 % SOLN Place 1 drop into both eyes at bedtime. Both eyes at hour of sleep    . Calcium Carbonate-Vitamin D (CALCIUM + D) 600-200 MG-UNIT per tablet Take 1 tablet by mouth daily.     . clorazepate (TRANXENE) 3.75 MG tablet Take 1 tablet (3.75 mg total) by mouth 3 (three) times daily as needed for anxiety. [place]  tablet 0  . cycloSPORINE (RESTASIS) 0.05 % ophthalmic emulsion     . Difluprednate (DUREZOL) 0.05 % EMUL Place 1 drop into both eyes daily.     Marland Kitchen docusate sodium (COLACE) 100 MG capsule Take 100 mg by mouth 2 (two) times daily.     . fexofenadine (ALLEGRA) 180 MG tablet Take 180 mg by mouth daily.     . metFORMIN (GLUCOPHAGE-XR) 500 MG 24 hr tablet Take 1 tablet (500 mg total) by mouth daily with breakfast. 30 tablet 0  . metoprolol tartrate (LOPRESSOR) 25 MG tablet TAKE 1 TABLET BY MOUTH TWICE DAILY 180 tablet 1  . multivitamin (THERAGRAN) per tablet Take 1 tablet by mouth daily.     . nitroGLYCERIN (NITROSTAT) 0.4 MG SL tablet Place 0.4 mg under the tongue every 5 (five) minutes as needed for chest pain.    . pantoprazole  (PROTONIX) 40 MG tablet TAKE 1 TABLET BY MOUTH EVERY DAY 90 tablet 0  . polyethylene glycol powder (GLYCOLAX/MIRALAX) powder Take 17 g by mouth daily. 255 g 0  . traMADol (ULTRAM) 50 MG tablet Take 1 tablet (50 mg total) by mouth every 6 (six) hours as needed. 60 tablet 0   No current facility-administered medications on file prior to visit.    BP 120/70 mmHg  Pulse 81  Temp(Src) 98 F (36.7 C) (Oral)  Resp 20  Ht 6\' 2"  (1.88 m)  Wt 208 lb (94.348 kg)  BMI 26.69 kg/m2  SpO2 97%     Review of Systems  Constitutional: Negative for fever, chills, appetite change and fatigue.  HENT: Negative for congestion, dental problem, ear pain, hearing loss, sore throat, tinnitus, trouble swallowing and voice change.   Eyes: Negative for pain, discharge and visual disturbance.  Respiratory: Negative for cough, chest tightness, wheezing and stridor.   Cardiovascular: Positive for leg swelling. Negative for chest pain and palpitations.  Gastrointestinal: Negative for nausea, vomiting, abdominal pain, diarrhea, constipation, blood in stool and abdominal distention.  Genitourinary: Negative for urgency, hematuria, flank pain, discharge, difficulty urinating and genital sores.  Musculoskeletal: Positive for gait problem. Negative for myalgias, back pain, joint swelling, arthralgias and neck stiffness.  Skin: Negative for rash.  Neurological: Negative for dizziness, syncope, speech difficulty, weakness, numbness and headaches.  Hematological: Negative for adenopathy. Does not bruise/bleed easily.  Psychiatric/Behavioral: Negative for behavioral problems and dysphoric mood. The patient is not nervous/anxious.        Objective:   Physical Exam  Constitutional: He is oriented to person, place, and time. He appears well-developed.  HENT:  Head: Normocephalic.  Right Ear: External ear normal.  Left Ear: External ear normal.  Eyes: Conjunctivae and EOM are normal.  Neck: Normal range of motion.    Cardiovascular: Normal rate and normal heart sounds.   Irregular rhythm with controlled ventricular response  Pulmonary/Chest: Breath sounds normal. He has no rales.  Abdominal: Bowel sounds are normal.  Musculoskeletal: Normal range of motion. He exhibits edema. He exhibits no tenderness.  1 plus left lower leg edema  Neurological: He is alert and oriented to person, place, and time.  Psychiatric: He has a normal mood and affect. His behavior is normal.          Assessment & Plan:  Permanent nature fibrillation.  Ventricular response, well-controlled.  Continue rate control and chronic anticoagulation Essential hypertension, stable Status post left hip surgery stable History congestive heart failure, compensated Impaired glucose tolerance.  Continue metformin.  Recheck hemoglobin A1c next visit   Nyoka Cowden,  MD

## 2016-02-26 NOTE — Patient Instructions (Addendum)
Limit your sodium (Salt) intake  Return in 6 months for follow-up   Please check your hemoglobin A1c every 6 months  Please check your blood pressure on a regular basis.  If it is consistently greater than 150/90, please make an office appointment.

## 2016-02-26 NOTE — Progress Notes (Signed)
Pre visit review using our clinic review tool, if applicable. No additional management support is needed unless otherwise documented below in the visit note. 

## 2016-02-27 ENCOUNTER — Telehealth: Payer: Self-pay | Admitting: Internal Medicine

## 2016-02-27 NOTE — Telephone Encounter (Signed)
Brad with [employer] would like some clarification on a dx in pt's chart. Pt's records are very vague on this particular issue.  Pt is trying to get some additional insurcance. Leroy Sea would like a call back.

## 2016-02-27 NOTE — Telephone Encounter (Signed)
Juliann Pulse, please call pt and tell him he will need to sign a release of information if he wants [place] to talk to insurance.

## 2016-03-01 NOTE — Telephone Encounter (Signed)
Prt aware to get dpr signed. Pt to call brad at [employer]

## 2016-03-04 LAB — PROTIME-INR: INR: 2.3 — AB (ref <=1.1)

## 2016-03-05 ENCOUNTER — Ambulatory Visit (INDEPENDENT_AMBULATORY_CARE_PROVIDER_SITE_OTHER): Payer: Medicare HMO | Admitting: General Practice

## 2016-03-05 DIAGNOSIS — Z5181 Encounter for therapeutic drug level monitoring: Secondary | ICD-10-CM

## 2016-03-05 DIAGNOSIS — I4891 Unspecified atrial fibrillation: Secondary | ICD-10-CM

## 2016-03-05 NOTE — Progress Notes (Signed)
Pre visit review using our clinic review tool, if applicable. No additional management support is needed unless otherwise documented below in the visit note. 

## 2016-03-08 ENCOUNTER — Telehealth: Payer: Self-pay | Admitting: Internal Medicine

## 2016-03-08 MED ORDER — TRAMADOL HCL 50 MG PO TABS: 50.0000 mg | ORAL_TABLET | Freq: Four times a day (QID) | ORAL | Status: DC | PRN

## 2016-03-08 NOTE — Telephone Encounter (Signed)
Pt states he had to go to the ED last night because he bent over and dislocated his   hip. Pt states he is in a lot of pain. Pt would like pain medicine. Pt states no one has prescribed him anything.  [facility]

## 2016-03-08 NOTE — Telephone Encounter (Signed)
Tramadol okay, #60, 50 mg 1 every 6 hours when necessary

## 2016-03-08 NOTE — Telephone Encounter (Signed)
Dr. Raliegh Ip, please see message and advise. Okay to call in Tramadol for pain?

## 2016-03-08 NOTE — Telephone Encounter (Signed)
Spoke to pt, told him Rx for Tramadol was called into the pharmacy for pain. Pt verbalized understanding.

## 2016-03-20 ENCOUNTER — Other Ambulatory Visit: Payer: Self-pay | Admitting: Internal Medicine

## 2016-03-29 ENCOUNTER — Ambulatory Visit (INDEPENDENT_AMBULATORY_CARE_PROVIDER_SITE_OTHER): Payer: Medicare HMO | Admitting: Adult Health

## 2016-03-29 ENCOUNTER — Encounter: Payer: Self-pay | Admitting: Adult Health

## 2016-03-29 VITALS — BP 104/60 | Temp 97.9°F | Ht 74.0 in | Wt 204.1 lb

## 2016-03-29 DIAGNOSIS — J209 Acute bronchitis, unspecified: Secondary | ICD-10-CM

## 2016-03-29 DIAGNOSIS — R109 Unspecified abdominal pain: Secondary | ICD-10-CM

## 2016-03-29 MED ORDER — PREDNISONE 10 MG PO TABS: ORAL_TABLET | ORAL | Status: DC

## 2016-03-29 MED ORDER — BENZONATATE 100 MG PO CAPS: 100.0000 mg | ORAL_CAPSULE | Freq: Two times a day (BID) | ORAL | Status: DC | PRN

## 2016-03-29 NOTE — Patient Instructions (Addendum)
It was great meeting you today  Your exam is consistent with bronchitis.   I have sent in a prescription for Prednisone.   Take as directed  40mg  x 3 days 20 mg x 3 days 10 mg x 3 days  I have also sent in a prescription for tessalon pearls this will help with the cough. You can also use Mucinex  Acute Bronchitis Bronchitis is inflammation of the airways that extend from the windpipe into the lungs (bronchi). The inflammation often causes mucus to develop. This leads to a cough, which is the most common symptom of bronchitis.  In acute bronchitis, the condition usually develops suddenly and goes away over time, usually in a couple weeks. Smoking, allergies, and asthma can make bronchitis worse. Repeated episodes of bronchitis may cause further lung problems.  CAUSES Acute bronchitis is most often caused by the same virus that causes a cold. The virus can spread from person to person (contagious) through coughing, sneezing, and touching contaminated objects. SIGNS AND SYMPTOMS   Cough.   Fever.   Coughing up mucus.   Body aches.   Chest congestion.   Chills.   Shortness of breath.   Sore throat.  DIAGNOSIS  Acute bronchitis is usually diagnosed through a physical exam. Your health care provider will also ask you questions about your medical history. Tests, such as chest X-rays, are sometimes done to rule out other conditions.  TREATMENT  Acute bronchitis usually goes away in a couple weeks. Oftentimes, no medical treatment is necessary. Medicines are sometimes given for relief of fever or cough. Antibiotic medicines are usually not needed but may be prescribed in certain situations. In some cases, an inhaler may be recommended to help reduce shortness of breath and control the cough. A cool mist vaporizer may also be used to help thin bronchial secretions and make it easier to clear the chest.  HOME CARE INSTRUCTIONS  Get plenty of rest.   Drink enough fluids to  keep your urine clear or pale yellow (unless you have a medical condition that requires fluid restriction). Increasing fluids may help thin your respiratory secretions (sputum) and reduce chest congestion, and it will prevent dehydration.   Take medicines only as directed by your health care provider.  If you were prescribed an antibiotic medicine, finish it all even if you start to feel better.  Avoid smoking and secondhand smoke. Exposure to cigarette smoke or irritating chemicals will make bronchitis worse. If you are a smoker, consider using nicotine gum or skin patches to help control withdrawal symptoms. Quitting smoking will help your lungs heal faster.   Reduce the chances of another bout of acute bronchitis by washing your hands frequently, avoiding people with cold symptoms, and trying not to touch your hands to your mouth, nose, or eyes.   Keep all follow-up visits as directed by your health care provider.  SEEK MEDICAL CARE IF: Your symptoms do not improve after 1 week of treatment.  SEEK IMMEDIATE MEDICAL CARE IF:  You develop an increased fever or chills.   You have chest pain.   You have severe shortness of breath.  You have bloody sputum.   You develop dehydration.  You faint or repeatedly feel like you are going to pass out.  You develop repeated vomiting.  You develop a severe headache. MAKE SURE YOU:   Understand these instructions.  Will watch your condition.  Will get help right away if you are not doing well or get worse.  This information is not intended to replace advice given to you by your health care provider. Make sure you discuss any questions you have with your health care provider.   Document Released: 10/17/2004 Document Revised: 09/30/2014 Document Reviewed: 03/02/2013 Elsevier Interactive Patient Education [employer].

## 2016-03-29 NOTE — Progress Notes (Signed)
Subjective:    Patient ID: [name], male    DOB: 19-Nov-1930, 82 y.o.   MRN: [id number]  HPI  Eric [name] is an 82 year old male who  has a past medical history of CAD (coronary artery disease); GERD (gastroesophageal reflux disease); Hyperlipidemia; Hypertension; Atrial fibrillation ([name]); Trigeminal neuralgia; CHF (congestive heart failure) ([name]); NSAID-induced gastric ulcer; Blood transfusion without reported diagnosis; and GI bleed. He presents to the office today with a family member for the complaint of a semi-productive cough 2 or 3 weeks with chest congestion, and sharp pain in the left flank that started roughly 36 hours ago. Reports that the pain in his left flank specially bed when he coughs. He reports he only thing he's been using has been cough drops and these have helped minimally  He denies any fevers, loss of appetite, feeling acutely ill, or sinus pain and pressure.   He denies any falls or trauma to his left flank. He has no bruising, redness, or warmth   Review of Systems  Constitutional: Negative.   HENT: Negative.   Respiratory: Positive for cough, chest tightness, shortness of breath and wheezing.   Cardiovascular: Negative.   Musculoskeletal: Positive for back pain (Left flank).  Skin: Negative.   Neurological: Negative.   All other systems reviewed and are negative.  Past Medical History  Diagnosis Date  . CAD (coronary artery disease)   . GERD (gastroesophageal reflux disease)   . Hyperlipidemia   . Hypertension   . Atrial fibrillation ([name])   . Trigeminal neuralgia   . CHF (congestive heart failure) ([name])   . NSAID-induced gastric ulcer   . Blood transfusion without reported diagnosis   . GI bleed     Social History   Social History  . Marital Status: Married    Spouse Name: N/A  . Number of Children: N/A  . Years of Education: N/A   Occupational History  . Not on file.   Social History Main Topics  . Smoking status: Former Smoker    Quit date: 09/23/1958  . Smokeless tobacco: Former Systems developer     Comment: quit smoking1980-quit chewing 6 months  . Alcohol Use: No  . Drug Use: No  . Sexual Activity: Not on file   Other Topics Concern  . Not on file   Social History Narrative    Past Surgical History  Procedure Laterality Date  . Total knee arthroplasty  June 2009    Left  . Shoulder surgery      Left  . Cholecystectomy    . Hip surgery  August 2008    Right total hip replacement  . Esophagogastroduodenoscopy  2011  . Flexible sigmoidoscopy N/A 06/04/2013    Procedure: FLEXIBLE SIGMOIDOSCOPY;  Surgeon: [name]., MD;  [name];  Service: Endoscopy;  Laterality: N/A;    No family history on file.  Allergies  Allergen Reactions  . Enoxaparin Sodium [Enoxaparin Sodium] Anaphylaxis and Other (See Comments)    angioedema  . Codeine Other (See Comments)    hallucinations  . Oxycodone Hcl [Oxycodone Hcl] Other (See Comments)    hallucinations  . Vicodin [Hydrocodone-Acetaminophen] Other (See Comments)    hallucinations  . Sulfonamide Derivatives Rash    Current Outpatient Prescriptions on File Prior to Visit  Medication Sig Dispense Refill  . acetaminophen (TYLENOL) 325 MG tablet Take 650 mg by mouth every 4 (four) hours as needed. pain    . albuterol (PROAIR HFA) 108 (90 BASE) MCG/ACT inhaler Inhale 2  puffs into the lungs every 6 (six) hours as needed for wheezing or shortness of breath. 1 Inhaler 5  . atorvastatin (LIPITOR) 40 MG tablet TAKE 1 TABLET AT BEDTIME 90 tablet 1  . Bimatoprost (LUMIGAN) 0.01 % SOLN Place 1 drop into both eyes at bedtime. Both eyes at hour of sleep    . Calcium Carbonate-Vitamin D (CALCIUM + D) 600-200 MG-UNIT per tablet Take 1 tablet by mouth daily.     . clorazepate (TRANXENE) 3.75 MG tablet Take 1 tablet (3.75 mg total) by mouth 3 (three) times daily as needed for anxiety. 90 tablet 0  . cycloSPORINE (RESTASIS) 0.05 % ophthalmic emulsion     .  Difluprednate (DUREZOL) 0.05 % EMUL Place 1 drop into both eyes daily.     Marland Kitchen docusate sodium (COLACE) 100 MG capsule Take 100 mg by mouth 2 (two) times daily.     . fexofenadine (ALLEGRA) 180 MG tablet Take 180 mg by mouth daily.     . furosemide (LASIX) 20 MG tablet Take 20 mg by mouth daily.    . metFORMIN (GLUCOPHAGE-XR) 500 MG 24 hr tablet TAKE 1 TABLET(500 MG) BY MOUTH DAILY WITH BREAKFAST 30 tablet 3  . metoprolol tartrate (LOPRESSOR) 25 MG tablet TAKE 1 TABLET BY MOUTH TWICE DAILY 180 tablet 1  . multivitamin (THERAGRAN) per tablet Take 1 tablet by mouth daily.     . nitroGLYCERIN (NITROSTAT) 0.4 MG SL tablet Place 0.4 mg under the tongue every 5 (five) minutes as needed for chest pain.    . pantoprazole (PROTONIX) 40 MG tablet TAKE 1 TABLET BY MOUTH EVERY DAY 90 tablet 0  . polyethylene glycol powder (GLYCOLAX/MIRALAX) powder Take 17 g by mouth daily. 255 g 0  . traMADol (ULTRAM) 50 MG tablet Take 1 tablet (50 mg total) by mouth every 6 (six) hours as needed. 60 tablet 0  . warfarin (COUMADIN) 5 MG tablet Take 5 mg by mouth daily. Take 2.5 mg on Mon, Wed and Friday.     No current facility-administered medications on file prior to visit.    BP 104/60 mmHg  Temp(Src) 97.9 F (36.6 C) (Oral)  Ht 6\' 2"  (1.88 m)  Wt 204 lb 1.6 oz (92.579 kg)  BMI 26.19 kg/m2       Objective:   Physical Exam  Constitutional: He is oriented to person, place, and time. He appears well-developed and well-nourished. No distress.  Cardiovascular: Normal rate, regular rhythm, normal heart sounds and intact distal pulses.  Exam reveals no gallop and no friction rub.   No murmur heard. Pulmonary/Chest: Effort normal. No respiratory distress. He has wheezes (Trace wheezing throughout lung fields). He has no rales. He exhibits no tenderness.  Abdominal: Soft. Bowel sounds are normal. He exhibits no distension and no mass. There is no tenderness. There is no rebound and no guarding.  Musculoskeletal: Normal  range of motion. He exhibits tenderness (Right tenderness to palpation below rib #11 on left side. No bruising or signs of trauma). He exhibits no edema.  Neurological: He is alert and oriented to person, place, and time.  Skin: Skin is warm and dry. No rash noted. He is not diaphoretic. No erythema. No pallor.  Psychiatric: He has a normal mood and affect. His behavior is normal. Judgment and thought content normal.  Nursing note and vitals reviewed.     Assessment & Plan:  1. Acute bronchitis, unspecified organism - No concern for pneumonia at this time - predniSONE (DELTASONE) 10 MG tablet; 40 mg  x 3 days, 20 mg x 3 days, 10 mg x 3 days  Dispense: 21 tablet; Refill: 0 - benzonatate (TESSALON) 100 MG capsule; Take 1 capsule (100 mg total) by mouth 2 (two) times daily as needed for cough.  Dispense: 20 capsule; Refill: 0 - Can also use Mucinex cough - Follow-up if no improvement in 2 or 3 days 2. Left flank pain - Likely muscle strain or costochondritis from continuous coughing - Pain should resolve as coughing decreases - Can use over-the-counter pain medication as needed - Also continues a heating pad - Follow up if no improvement  Dorothyann Peng, NP

## 2016-04-15 ENCOUNTER — Telehealth: Payer: Self-pay | Admitting: Internal Medicine

## 2016-04-15 NOTE — Telephone Encounter (Signed)
Pt saw cory on 03/29/16 and still has bronchitis. Please advise [facility] [address]

## 2016-04-16 NOTE — Telephone Encounter (Signed)
Appt scheduled with Dr. Raliegh Ip on Friday 04/16/16

## 2016-04-16 NOTE — Telephone Encounter (Signed)
Needs to follow up either with me for this acute issue or with Dr. Raliegh Ip

## 2016-04-16 NOTE — Telephone Encounter (Signed)
Please advise 

## 2016-04-17 LAB — PROTIME-INR: INR: 4.8 — AB (ref <=1.1)

## 2016-04-18 ENCOUNTER — Ambulatory Visit (INDEPENDENT_AMBULATORY_CARE_PROVIDER_SITE_OTHER): Payer: Medicare HMO | Admitting: General Practice

## 2016-04-18 DIAGNOSIS — I4891 Unspecified atrial fibrillation: Secondary | ICD-10-CM

## 2016-04-18 DIAGNOSIS — Z5181 Encounter for therapeutic drug level monitoring: Secondary | ICD-10-CM

## 2016-04-18 NOTE — Progress Notes (Signed)
I agree with this plan.

## 2016-04-19 ENCOUNTER — Ambulatory Visit: Payer: Medicare HMO | Admitting: Internal Medicine

## 2016-04-22 ENCOUNTER — Ambulatory Visit (INDEPENDENT_AMBULATORY_CARE_PROVIDER_SITE_OTHER): Payer: Medicare HMO | Admitting: Podiatry

## 2016-04-22 ENCOUNTER — Encounter: Payer: Self-pay | Admitting: Podiatry

## 2016-04-22 DIAGNOSIS — B351 Tinea unguium: Secondary | ICD-10-CM | POA: Diagnosis not present

## 2016-04-22 DIAGNOSIS — M79676 Pain in unspecified toe(s): Secondary | ICD-10-CM

## 2016-04-22 NOTE — Progress Notes (Signed)
   Subjective:    Patient ID: [name], male    DOB: 18-May-1931, 82 y.o.   MRN: [id number]  HPIThis patient presents to the office with long thick painful nails.  He says the nails are painful walking and wearing his shoes.  He has been unable to  Treat himself.  He presents for  Preventive foot care services.  Patient is here today for toenail debridement.  Review of Systems  All other systems reviewed and are negative.      Objective:   Physical Exam GENERAL APPEARANCE: Alert, conversant. Appropriately groomed. No acute distress.  VASCULAR: Pedal pulses palpable at  [facility] and PT bilateral.  Capillary refill time is immediate to all digits,  Normal temperature gradient.  Digital hair growth is present bilateral  NEUROLOGIC: sensation is normal to 5.07 monofilament at 5/5 sites bilateral.  Light touch is intact bilateral, Muscle strength normal.  MUSCULOSKELETAL: acceptable muscle strength, tone and stability bilateral.  Intrinsic muscluature intact bilateral.  Rectus appearance of foot and digits noted bilateral.   DERMATOLOGIC: skin color, texture, and turgor are within normal limits.  No preulcerative lesions or ulcers  are seen, no interdigital maceration noted.  No open lesions present.  . No drainage noted.  NAILS  Thick disfigured discolored nails with subungual debris noted.         Assessment & Plan:  Onychomycosis B/L  IE  Debridement and grinding of long painful nails  RTC 3 months.   Gardiner Barefoot DPM

## 2016-04-23 ENCOUNTER — Ambulatory Visit (INDEPENDENT_AMBULATORY_CARE_PROVIDER_SITE_OTHER): Payer: Medicare HMO | Admitting: Internal Medicine

## 2016-04-23 ENCOUNTER — Encounter: Payer: Self-pay | Admitting: Internal Medicine

## 2016-04-23 VITALS — BP 104/70 | HR 89 | Temp 97.8°F | Wt 202.8 lb

## 2016-04-23 DIAGNOSIS — I10 Essential (primary) hypertension: Secondary | ICD-10-CM | POA: Diagnosis not present

## 2016-04-23 DIAGNOSIS — R7302 Impaired glucose tolerance (oral): Secondary | ICD-10-CM

## 2016-04-23 DIAGNOSIS — I5022 Chronic systolic (congestive) heart failure: Secondary | ICD-10-CM | POA: Diagnosis not present

## 2016-04-23 DIAGNOSIS — I480 Paroxysmal atrial fibrillation: Secondary | ICD-10-CM | POA: Diagnosis not present

## 2016-04-23 LAB — CBC WITH DIFFERENTIAL/PLATELET
BASOS ABS: 0 10*3/uL (ref 0.0–0.1)
Basophils Relative: 0.5 % (ref 0.0–3.0)
EOS ABS: 0.3 10*3/uL (ref 0.0–0.7)
Eosinophils Relative: 4.1 % (ref 0.0–5.0)
HEMATOCRIT: 38.6 % — AB (ref 39.0–52.0)
HEMOGLOBIN: 12.5 g/dL — AB (ref 13.0–17.0)
LYMPHS PCT: 14.2 % (ref 12.0–46.0)
Lymphs Abs: 1.1 10*3/uL (ref 0.7–4.0)
MCHC: 32.5 g/dL (ref 30.0–36.0)
MCV: 90.8 fl (ref 78.0–100.0)
MONOS PCT: 12.3 % — AB (ref 3.0–12.0)
Monocytes Absolute: 1 10*3/uL (ref 0.1–1.0)
NEUTROS ABS: 5.4 10*3/uL (ref 1.4–7.7)
Neutrophils Relative %: 68.9 % (ref 43.0–77.0)
Platelets: 200 10*3/uL (ref 150.0–400.0)
RBC: 4.25 Mil/uL (ref 4.22–5.81)
RDW: 17 % — ABNORMAL HIGH (ref 11.5–15.5)
WBC: 7.9 10*3/uL (ref 4.0–10.5)

## 2016-04-23 LAB — COMPREHENSIVE METABOLIC PANEL
ALK PHOS: 87 U/L (ref 39–117)
ALT: 12 U/L (ref 0–53)
AST: 22 U/L (ref 0–37)
Albumin: 3.7 g/dL (ref 3.5–5.2)
BILIRUBIN TOTAL: 0.6 mg/dL (ref 0.2–1.2)
BUN: 12 mg/dL (ref 6–23)
CALCIUM: 9.3 mg/dL (ref 8.4–10.5)
CO2: 30 meq/L (ref 19–32)
Chloride: 104 mEq/L (ref 96–112)
Creatinine, Ser: 0.74 mg/dL (ref 0.40–1.50)
GFR: 107.44 mL/min (ref >60.00)
Glucose, Bld: 125 mg/dL — ABNORMAL HIGH (ref 70–99)
POTASSIUM: 4 meq/L (ref 3.5–5.1)
Sodium: 142 mEq/L (ref 135–145)
TOTAL PROTEIN: 6.6 g/dL (ref 6.0–8.3)

## 2016-04-23 LAB — BRAIN NATRIURETIC PEPTIDE: PRO B NATRI PEPTIDE: 115 pg/mL — AB (ref 0.0–100.0)

## 2016-04-23 LAB — HEMOGLOBIN A1C: Hgb A1c MFr Bld: 6.1 % (ref 4.6–6.5)

## 2016-04-23 NOTE — Progress Notes (Signed)
Pre visit review using our clinic review tool, if applicable. No additional management support is needed unless otherwise documented below in the visit note. 

## 2016-04-23 NOTE — Progress Notes (Signed)
Subjective:    Patient ID: [name], male    DOB: Jan 21, 1931, 82 y.o.   MRN: [id number]  HPI 82 year old patient who is seen today in follow-up.  He complains of increasing cough and shortness of breath over the past 2 weeks.  He was seen here approximately 3 weeks ago.  Also complaining of increasing cough and shortness of breath.  He was treated for an exacerbation of COPD with a prednisone taper. His problem list includes a history of congestive heart failure.  He did have a nuclear stress test performed May 2015.  The revealed an ejection fraction of 52%.  It was a difficult study and suggested possible inferolateral ischemia.  He denies any exertional chest pain Remains on chronic Coumadin anticoagulation due to a history of paroxysmal atrial fibrillation.  His Coumadin dosing has recently been adjusted. He has a history of impaired glucose tolerance and now is on low-dose metformin therapy  Lab Results  Component Value Date   HGBA1C 6.4 01/15/2016    Wt Readings from Last 3 Encounters:  04/23/16 202 lb 12.8 oz (92 kg)  03/29/16 204 lb 1.6 oz (92.6 kg)  02/26/16 208 lb (94.3 kg)   Cough is nonproductive.  Denies any fever.  Past Medical History:  Diagnosis Date  . Atrial fibrillation ([place])   . Blood transfusion without reported diagnosis   . CAD (coronary artery disease)   . CHF (congestive heart failure) ([place])   . GERD (gastroesophageal reflux disease)   . GI bleed   . Hyperlipidemia   . Hypertension   . NSAID-induced gastric ulcer   . Trigeminal neuralgia      Social History   Social History  . Marital status: Married    Spouse name: N/A  . Number of children: N/A  . Years of education: N/A   Occupational History  . Not on file.   Social History Main Topics  . Smoking status: Former Smoker    Quit date: 09/23/1958  . Smokeless tobacco: Former Systems developer     Comment: quit smoking1980-quit chewing 6 months  . Alcohol use No  . Drug use: No  . Sexual  activity: Not on file   Other Topics Concern  . Not on file   Social History Narrative  . No narrative on file    Past Surgical History:  Procedure Laterality Date  . CHOLECYSTECTOMY    . ESOPHAGOGASTRODUODENOSCOPY  2011  . FLEXIBLE SIGMOIDOSCOPY N/A 06/04/2013   Procedure: FLEXIBLE SIGMOIDOSCOPY;  Surgeon: Winfield Cunas., MD;  Location: [facility] ENDOSCOPY;  Service: Endoscopy;  Laterality: N/A;  . HIP SURGERY  August 2008   Right total hip replacement  . SHOULDER SURGERY     Left  . TOTAL KNEE ARTHROPLASTY  June 2009   Left    No family history on file.  Allergies  Allergen Reactions  . Enoxaparin Sodium Anaphylaxis and Other (See Comments)    angioedema  . Enoxaparin Sodium [Enoxaparin Sodium] Anaphylaxis and Other (See Comments)    angioedema  . Codeine Other (See Comments)    hallucinations hallucinations  . Hydrocodone-Acetaminophen Other (See Comments)     hallucinations Other reaction(s): GI Upset (intolerance)  . Oxycodone Hcl Other (See Comments)    hallucinations  . Oxycodone Hcl [Oxycodone Hcl] Other (See Comments)    hallucinations  . Vicodin [Hydrocodone-Acetaminophen] Other (See Comments)    hallucinations  . Sulfonamide Derivatives Rash  . Tramadol Rash    Current Outpatient Prescriptions on File Prior to Visit  Medication Sig Dispense Refill  . acetaminophen (TYLENOL) 325 MG tablet Take 650 mg by mouth every 4 (four) hours as needed. pain    . albuterol (PROAIR HFA) 108 (90 BASE) MCG/ACT inhaler Inhale 2 puffs into the lungs every 6 (six) hours as needed for wheezing or shortness of breath. 1 Inhaler 5  . atorvastatin (LIPITOR) 40 MG tablet TAKE 1 TABLET AT BEDTIME 90 tablet 1  . benzonatate (TESSALON) 100 MG capsule Take 1 capsule (100 mg total) by mouth 2 (two) times daily as needed for cough. 20 capsule 0  . Bimatoprost (LUMIGAN) 0.01 % SOLN Place 1 drop into both eyes at bedtime. Both eyes at hour of sleep    . Calcium Carbonate-Vitamin D  (CALCIUM + D) 600-200 MG-UNIT per tablet Take 1 tablet by mouth daily.     . clorazepate (TRANXENE) 3.75 MG tablet Take 1 tablet (3.75 mg total) by mouth 3 (three) times daily as needed for anxiety. 90 tablet 0  . cycloSPORINE (RESTASIS) 0.05 % ophthalmic emulsion     . Difluprednate (DUREZOL) 0.05 % EMUL Place 1 drop into both eyes daily.     Marland Kitchen docusate sodium (COLACE) 100 MG capsule Take 100 mg by mouth 2 (two) times daily.     . fexofenadine (ALLEGRA) 180 MG tablet Take 180 mg by mouth daily.     . furosemide (LASIX) 20 MG tablet Take 20 mg by mouth daily.    . metFORMIN (GLUCOPHAGE-XR) 500 MG 24 hr tablet TAKE 1 TABLET(500 MG) BY MOUTH DAILY WITH BREAKFAST 30 tablet 3  . metoprolol tartrate (LOPRESSOR) 25 MG tablet TAKE 1 TABLET BY MOUTH TWICE DAILY 180 tablet 1  . multivitamin (THERAGRAN) per tablet Take 1 tablet by mouth daily.     . nitroGLYCERIN (NITROSTAT) 0.4 MG SL tablet Place 0.4 mg under the tongue every 5 (five) minutes as needed for chest pain.    . pantoprazole (PROTONIX) 40 MG tablet TAKE 1 TABLET BY MOUTH EVERY DAY 90 tablet 0  . polyethylene glycol powder (GLYCOLAX/MIRALAX) powder Take 17 g by mouth daily. 255 g 0  . predniSONE (DELTASONE) 10 MG tablet 40 mg x 3 days, 20 mg x 3 days, 10 mg x 3 days 21 tablet 0  . warfarin (COUMADIN) 5 MG tablet Take 5 mg by mouth daily. Take 2.5 mg on Mon, Wed and Friday.     No current facility-administered medications on file prior to visit.     BP 104/70 (BP Location: Left Arm, Patient Position: Sitting, Cuff Size: Normal)   Pulse 89   Temp 97.8 F (36.6 C) (Oral)   Wt 202 lb 12.8 oz (92 kg)   SpO2 95%   BMI 26.04 kg/m      Review of Systems  Constitutional: Negative for appetite change, chills, fatigue and fever.  HENT: Negative for congestion, dental problem, ear pain, hearing loss, sore throat, tinnitus, trouble swallowing and voice change.   Eyes: Negative for pain, discharge and visual disturbance.  Respiratory: Positive  for cough and shortness of breath. Negative for chest tightness, wheezing and stridor.   Cardiovascular: Negative for chest pain, palpitations and leg swelling.  Gastrointestinal: Negative for abdominal distention, abdominal pain, blood in stool, constipation, diarrhea, nausea and vomiting.  Genitourinary: Negative for difficulty urinating, discharge, flank pain, genital sores, hematuria and urgency.  Musculoskeletal: Negative for arthralgias, back pain, gait problem, joint swelling, myalgias and neck stiffness.  Skin: Negative for rash.  Neurological: Negative for dizziness, syncope, speech difficulty, weakness, numbness and  headaches.  Hematological: Negative for adenopathy. Does not bruise/bleed easily.  Psychiatric/Behavioral: Negative for behavioral problems and dysphoric mood. The patient is not nervous/anxious.        Objective:   Physical Exam  Constitutional: He is oriented to person, place, and time. He appears well-developed. No distress.  HENT:  Head: Normocephalic.  Right Ear: External ear normal.  Left Ear: External ear normal.  Eyes: Conjunctivae and EOM are normal.  Neck: Normal range of motion. JVD present.  Cardiovascular: Normal rate, regular rhythm and normal heart sounds.   No murmur heard. Pulmonary/Chest: No respiratory distress. He has no wheezes. He has rales.  Rales lower 1 half lung fields  Abdominal: Bowel sounds are normal.  Musculoskeletal: Normal range of motion. He exhibits edema. He exhibits no tenderness.  Left lower leg edema  Neurological: He is alert and oriented to person, place, and time.  Psychiatric: He has a normal mood and affect. His behavior is normal.          Assessment & Plan:   History of congestive heart failure, although nuclear stress test 2 years ago revealed normal ejection fraction Clinical exam consistent with fluid overload, but there has been no weight gain Chronic Coumadin anticoagulation Impaired glucose  tolerance  We'll check screening lab including hemoglobin A1c, electrolytes and BNP Low-salt diet.  Encouraged Increase furosemide  Recheck one month Review chest x-ray  Nyoka Cowden, MD

## 2016-04-23 NOTE — Patient Instructions (Signed)
Limit your sodium (Salt) intake  Increase furosemide to 40 mg every morning  Return in one month for follow-up  Chest x-ray as discussed  Call or return to clinic prn if these symptoms worsen or fail to improve as anticipated.

## 2016-04-24 ENCOUNTER — Ambulatory Visit (INDEPENDENT_AMBULATORY_CARE_PROVIDER_SITE_OTHER)
Admission: RE | Admit: 2016-04-24 | Discharge: 2016-04-24 | Disposition: A | Discharge status: Home / Self Care | Payer: Medicare HMO | Source: Ambulatory Visit | Attending: Internal Medicine | Admitting: Internal Medicine

## 2016-04-24 DIAGNOSIS — I5022 Chronic systolic (congestive) heart failure: Secondary | ICD-10-CM | POA: Diagnosis not present

## 2016-04-25 ENCOUNTER — Telehealth: Payer: Self-pay | Admitting: Internal Medicine

## 2016-04-25 NOTE — Telephone Encounter (Signed)
Pt needs cough med

## 2016-04-25 NOTE — Telephone Encounter (Signed)
Pt needs chest xray results

## 2016-04-29 ENCOUNTER — Telehealth: Payer: Self-pay | Admitting: Internal Medicine

## 2016-04-29 MED ORDER — DOXYCYCLINE HYCLATE 100 MG PO TABS: 100.0000 mg | ORAL_TABLET | Freq: Two times a day (BID) | ORAL | 0 refills | Status: DC

## 2016-04-29 NOTE — Telephone Encounter (Signed)
Chest x-ray revealed chronic bronchitic changes only No pneumonia or heart failure  Please call in a prescription for doxycycline 100 mg #20 one twice a day

## 2016-04-29 NOTE — Telephone Encounter (Signed)
° °  Pt call and said he is still waiting on his call back concerning his chest xray

## 2016-04-29 NOTE — Telephone Encounter (Signed)
Mucinex DM one twice daily

## 2016-04-29 NOTE — Telephone Encounter (Signed)
Rx sent in. Patient aware of chest x-ray. He mentioned wanting something to help break up the cough. Will the doxy help with that or can he use something over the counter?

## 2016-04-29 NOTE — Telephone Encounter (Signed)
Please advise on chest x-ray results & on cough med.

## 2016-04-29 NOTE — Telephone Encounter (Signed)
Pt would like to have xray result from the 04/24/16 visit.

## 2016-04-30 ENCOUNTER — Telehealth: Payer: Self-pay | Admitting: Internal Medicine

## 2016-04-30 MED ORDER — AMOXICILLIN-POT CLAVULANATE 875-125 MG PO TABS: 1.0000 | ORAL_TABLET | Freq: Two times a day (BID) | ORAL | 0 refills | Status: DC

## 2016-04-30 NOTE — Telephone Encounter (Signed)
Patient went to pick up doxy rx and the pharmacy told him that it would cause him to bleed. Patient did not pick up the doxy. Please advise. Patient has been taking Dextromathorphan, he got it over the counter at the drug store. Patient said it is helping with his coughing and wants to know if its okay for him to continue taking.

## 2016-04-30 NOTE — Telephone Encounter (Signed)
Rx was sent in to pharmacy and patient notified.

## 2016-04-30 NOTE — Addendum Note (Signed)
Addended by: Kateri Mc E on: 04/30/2016 11:57 AM   Modules accepted: Orders

## 2016-04-30 NOTE — Telephone Encounter (Signed)
Pt calling to check the status b/c he wanted to know something before 12:00.  Pls call wife Stanton [phone].

## 2016-04-30 NOTE — Telephone Encounter (Signed)
Rx sent and patient aware 

## 2016-04-30 NOTE — Telephone Encounter (Signed)
Please call in a prescription for Augmentin 875 #14 one twice a day

## 2016-04-30 NOTE — Telephone Encounter (Signed)
Please call in a generic Augmentin 875 #14 one twice a day

## 2016-04-30 NOTE — Telephone Encounter (Signed)
Pt went to pick up Rx yesterday: doxycycline (VIBRA-TABS) 100 MG tablet  The pharmacist told him he could start bleeding internally  and make his blood thinner than what it is, so pt did not get it.  Please advise  Pt did get OTC dextromethorphan polistirex extened release. This did help.

## 2016-05-01 NOTE — Telephone Encounter (Signed)
Results already given to patient in another message.

## 2016-05-06 ENCOUNTER — Ambulatory Visit: Payer: Self-pay | Admitting: General Practice

## 2016-05-06 DIAGNOSIS — I4891 Unspecified atrial fibrillation: Secondary | ICD-10-CM

## 2016-05-06 DIAGNOSIS — Z5181 Encounter for therapeutic drug level monitoring: Secondary | ICD-10-CM

## 2016-05-06 LAB — PROTIME-INR: INR: 2.3 — AB (ref <=1.1)

## 2016-05-06 NOTE — Patient Instructions (Signed)
Pre visit review using our clinic review tool, if applicable. No additional management support is needed unless otherwise documented below in the visit note. 

## 2016-05-07 ENCOUNTER — Telehealth: Payer: Self-pay | Admitting: Internal Medicine

## 2016-05-07 NOTE — Telephone Encounter (Signed)
Dosing instructions given to patient.  Pt verbalized understanding. 

## 2016-05-07 NOTE — Telephone Encounter (Signed)
Complete antibiotics.  Notify patient that residual cough, congestion may take some time to resolve completely.  Notify if any clinical deterioration or fever

## 2016-05-07 NOTE — Telephone Encounter (Signed)
Pt states he has one tab of the amoxicillin-clavulanate (AUGMENTIN) 875-125 MG tablet  Left.  Pt states he is better, but not completely weel. Still has congestion, and cough starting about 8 pm. Pt does not feel it has broken up completey .  [facility]

## 2016-05-28 ENCOUNTER — Ambulatory Visit (INDEPENDENT_AMBULATORY_CARE_PROVIDER_SITE_OTHER): Payer: Medicare HMO | Admitting: Internal Medicine

## 2016-05-28 ENCOUNTER — Encounter: Payer: Self-pay | Admitting: Internal Medicine

## 2016-05-28 VITALS — BP 120/76 | HR 61 | Temp 97.8°F | Resp 20 | Ht 74.0 in | Wt 208.2 lb

## 2016-05-28 DIAGNOSIS — I5030 Unspecified diastolic (congestive) heart failure: Secondary | ICD-10-CM

## 2016-05-28 DIAGNOSIS — Z23 Encounter for immunization: Secondary | ICD-10-CM | POA: Diagnosis not present

## 2016-05-28 DIAGNOSIS — I10 Essential (primary) hypertension: Secondary | ICD-10-CM | POA: Diagnosis not present

## 2016-05-28 NOTE — Progress Notes (Signed)
Pre visit review using our clinic review tool, if applicable. No additional management support is needed unless otherwise documented below in the visit note. 

## 2016-05-28 NOTE — Patient Instructions (Signed)
Limit your sodium (Salt) intake  Return in 6 months for follow-up   Check your INR every 4-6 weeks

## 2016-05-28 NOTE — Progress Notes (Signed)
Subjective:    Patient ID: Eric [name], male    DOB: 1930-10-14, 82 y.o.   MRN: [id number]  HPI  82 year old patient who is seen today in follow-up.  He is seen in about 5 weeks ago and treated for bronchitis.  He is doing much better but still has some mild residual cough.  Denies any further shortness of breath.  Chest x-ray was obtained at that time.  The revealed some bronchitic changes but no evidence of CHF.  BNP was normal He has a history of diabetes which has been stable.  She was seen at [facility] recently and hemoglobin A1c was checked and was 6.4. Remains on chronic Coumadin anticoagulation with a history of atrial fibrillation.  He has essential hypertension which has been stable  Past Medical History:  Diagnosis Date  . Atrial fibrillation ([place])   . Blood transfusion without reported diagnosis   . CAD (coronary artery disease)   . CHF (congestive heart failure) ([place])   . GERD (gastroesophageal reflux disease)   . GI bleed   . Hyperlipidemia   . Hypertension   . NSAID-induced gastric ulcer   . Trigeminal neuralgia      Social History   Social History  . Marital status: Married    Spouse name: N/A  . Number of children: N/A  . Years of education: N/A   Occupational History  . Not on file.   Social History Main Topics  . Smoking status: Former Smoker    Quit date: 09/23/1958  . Smokeless tobacco: Former Systems developer     Comment: quit smoking1980-quit chewing 6 months  . Alcohol use No  . Drug use: No  . Sexual activity: Not on file   Other Topics Concern  . Not on file   Social History Narrative  . No narrative on file    Past Surgical History:  Procedure Laterality Date  . CHOLECYSTECTOMY    . ESOPHAGOGASTRODUODENOSCOPY  2011  . FLEXIBLE SIGMOIDOSCOPY N/A 06/04/2013   Procedure: FLEXIBLE SIGMOIDOSCOPY;  Surgeon: Winfield Cunas., MD;  Location: [facility] ENDOSCOPY;  Service: Endoscopy;  Laterality: N/A;  . HIP SURGERY  August 2008   Right total hip  replacement  . SHOULDER SURGERY     Left  . TOTAL KNEE ARTHROPLASTY  June 2009   Left    No family history on file.  Allergies  Allergen Reactions  . Enoxaparin Sodium Anaphylaxis and Other (See Comments)    angioedema  . Enoxaparin Sodium [Enoxaparin Sodium] Anaphylaxis and Other (See Comments)    angioedema  . Codeine Other (See Comments)    hallucinations hallucinations  . Hydrocodone-Acetaminophen Other (See Comments)     hallucinations Other reaction(s): GI Upset (intolerance)  . Oxycodone Hcl Other (See Comments)    hallucinations  . Oxycodone Hcl [Oxycodone Hcl] Other (See Comments)    hallucinations  . Vicodin [Hydrocodone-Acetaminophen] Other (See Comments)    hallucinations  . Sulfonamide Derivatives Rash  . Tramadol Rash    Current Outpatient Prescriptions on File Prior to Visit  Medication Sig Dispense Refill  . acetaminophen (TYLENOL) 325 MG tablet Take 650 mg by mouth every 4 (four) hours as needed. pain    . albuterol (PROAIR HFA) 108 (90 BASE) MCG/ACT inhaler Inhale 2 puffs into the lungs every 6 (six) hours as needed for wheezing or shortness of breath. 1 Inhaler 5  . atorvastatin (LIPITOR) 40 MG tablet TAKE 1 TABLET AT BEDTIME 90 tablet 1  . benzonatate (TESSALON) 100 MG  capsule Take 1 capsule (100 mg total) by mouth 2 (two) times daily as needed for cough. 20 capsule 0  . Bimatoprost (LUMIGAN) 0.01 % SOLN Place 1 drop into both eyes at bedtime. Both eyes at hour of sleep    . Calcium Carbonate-Vitamin D (CALCIUM + D) 600-200 MG-UNIT per tablet Take 1 tablet by mouth daily.     . clorazepate (TRANXENE) 3.75 MG tablet Take 1 tablet (3.75 mg total) by mouth 3 (three) times daily as needed for anxiety. 90 tablet 0  . cycloSPORINE (RESTASIS) 0.05 % ophthalmic emulsion     . Difluprednate (DUREZOL) 0.05 % EMUL Place 1 drop into both eyes daily.     Marland Kitchen docusate sodium (COLACE) 100 MG capsule Take 100 mg by mouth 2 (two) times daily.     . fexofenadine (ALLEGRA)  180 MG tablet Take 180 mg by mouth daily.     . furosemide (LASIX) 20 MG tablet Take 40 mg by mouth daily.    . metFORMIN (GLUCOPHAGE-XR) 500 MG 24 hr tablet TAKE 1 TABLET(500 MG) BY MOUTH DAILY WITH BREAKFAST 30 tablet 3  . metoprolol tartrate (LOPRESSOR) 25 MG tablet TAKE 1 TABLET BY MOUTH TWICE DAILY 180 tablet 1  . multivitamin (THERAGRAN) per tablet Take 1 tablet by mouth daily.     . nitroGLYCERIN (NITROSTAT) 0.4 MG SL tablet Place 0.4 mg under the tongue every 5 (five) minutes as needed for chest pain.    . pantoprazole (PROTONIX) 40 MG tablet TAKE 1 TABLET BY MOUTH EVERY DAY 90 tablet 0  . polyethylene glycol powder (GLYCOLAX/MIRALAX) powder Take 17 g by mouth daily. 255 g 0  . warfarin (COUMADIN) 5 MG tablet Take 5 mg by mouth daily. Take 2.5 mg on Mon, Wed and Friday.     No current facility-administered medications on file prior to visit.     BP 120/76 (BP Location: Left Arm, Patient Position: Sitting, Cuff Size: Normal)   Pulse 61   Temp 97.8 F (36.6 C) (Oral)   Resp 20   Ht 6\' 2"  (1.88 m)   Wt 208 lb 4 oz (94.5 kg)   SpO2 96%   BMI 26.74 kg/m     Review of Systems  Constitutional: Negative for appetite change, chills, fatigue and fever.  HENT: Negative for congestion, dental problem, ear pain, hearing loss, sore throat, tinnitus, trouble swallowing and voice change.   Eyes: Negative for pain, discharge and visual disturbance.  Respiratory: Positive for cough. Negative for chest tightness, wheezing and stridor.   Cardiovascular: Negative for chest pain, palpitations and leg swelling.  Gastrointestinal: Negative for abdominal distention, abdominal pain, blood in stool, constipation, diarrhea, nausea and vomiting.  Genitourinary: Negative for difficulty urinating, discharge, flank pain, genital sores, hematuria and urgency.  Musculoskeletal: Negative for arthralgias, back pain, gait problem, joint swelling, myalgias and neck stiffness.  Skin: Negative for rash.    Neurological: Negative for dizziness, syncope, speech difficulty, weakness, numbness and headaches.  Hematological: Negative for adenopathy. Does not bruise/bleed easily.  Psychiatric/Behavioral: Negative for behavioral problems and dysphoric mood. The patient is not nervous/anxious.        Objective:   Physical Exam  Constitutional: He is oriented to person, place, and time. He appears well-developed.  HENT:  Head: Normocephalic.  Right Ear: External ear normal.  Left Ear: External ear normal.  Eyes: Conjunctivae and EOM are normal.  Neck: Normal range of motion.  Cardiovascular: Normal rate and normal heart sounds.   Pulmonary/Chest: He has rales.  Abdominal:  Bowel sounds are normal.  Musculoskeletal: Normal range of motion. He exhibits no edema or tenderness.  No significant left leg edema  Neurological: He is alert and oriented to person, place, and time.  Psychiatric: He has a normal mood and affect. His behavior is normal.          Assessment & Plan:   Resolving bronchitis with persistent cough improved Chronic anticoagulation Diabetes mellitus stable  Continue low-salt diet Recheck 6 months  Matilyn Fehrman Pilar Plate

## 2016-06-10 ENCOUNTER — Ambulatory Visit (INDEPENDENT_AMBULATORY_CARE_PROVIDER_SITE_OTHER): Payer: Medicare HMO | Admitting: Podiatry

## 2016-06-10 ENCOUNTER — Encounter: Payer: Self-pay | Admitting: Podiatry

## 2016-06-10 VITALS — BP 111/62 | HR 83 | Resp 20 | Ht 74.0 in | Wt 208.0 lb

## 2016-06-10 DIAGNOSIS — L03032 Cellulitis of left toe: Secondary | ICD-10-CM | POA: Diagnosis not present

## 2016-06-10 NOTE — Progress Notes (Signed)
   Subjective:    Patient ID: Eric [name], male    DOB: 04-08-1931, 82 y.o.   MRN: [id number]  HPIThis patient presents to the office with long thick painful nails.  He says the nails are painful walking and wearing his shoes.  He has been unable to  Treat himself. He says he is having pain along both borders left big toe.   He presents for  Preventive foot care services.    Review of Systems  All other systems reviewed and are negative.      Objective:   Physical Exam GENERAL APPEARANCE: Alert, conversant. Appropriately groomed. No acute distress.  VASCULAR: Pedal pulses palpable at  [facility] and PT bilateral.  Capillary refill time is immediate to all digits,  Normal temperature gradient.  Digital hair growth is present bilateral  NEUROLOGIC: sensation is normal to 5.07 monofilament at 5/5 sites bilateral.  Light touch is intact bilateral, Muscle strength normal.  MUSCULOSKELETAL: acceptable muscle strength, tone and stability bilateral.  Intrinsic muscluature intact bilateral.  Rectus appearance of foot and digits noted bilateral.   DERMATOLOGIC: skin color, texture, and turgor are within normal limits.  No preulcerative lesions or ulcers  are seen, no interdigital maceration noted.  No open lesions present.  . No drainage noted.  NAILS  Thick disfigured discolored nails with subungual debris noted. Marked incurvation medial and lateral borders left great toe.  No granulation tissue noted.         Assessment & Plan:  Onychomycosis B/L  Paronychia left hallux.  IE  Debridement and grinding of long painful nails  Incision and drainage medial and lateral borders left great toe. RTC 3 months.      Gardiner Barefoot DPM

## 2016-06-26 ENCOUNTER — Telehealth: Payer: Self-pay | Admitting: Internal Medicine

## 2016-06-26 ENCOUNTER — Ambulatory Visit: Payer: Self-pay | Admitting: Family Medicine

## 2016-06-26 NOTE — Telephone Encounter (Signed)
Patient Name: Eric [name]  DOB: 1930/11/16    Initial Comment abdominal cramping on both sides.   Nurse Assessment  Nurse: Julien Girt, RN, Almyra Free Date/Time Eilene Ghazi Time): 06/26/2016 4:34:26 PM  Confirm and document reason for call. If symptomatic, describe symptoms. You must click the next button to save text entered. ---Caller states he has had abdominal cramping on both sides of his lower abdomen for the last 2 weeks. Today it was on the left only, the pain lasted a few minutes and went away. He is not having pain at this time. These cramps began after his fluid pill was increased.( Lasix 40 mg ?) . He has had leg cramps that have also increased , the swelling in his legs has improved but the left leg is still more swollen than the right.  Has the patient traveled out of the country within the last 30 days? ---Not Applicable  Does the patient have any new or worsening symptoms? ---Yes  Will a triage be completed? ---Yes  Related visit to physician within the last 2 weeks? ---No  Does the PT have any chronic conditions? (i.e. diabetes, asthma, etc.) ---Yes  List chronic conditions. ---CHF, CAD, GERD, A fib, Hyperlipidemia, Hx THA right, THA/TKA - left  Is this a behavioral health or substance abuse call? ---No     Guidelines    Guideline Title Affirmed Question Affirmed Notes  Leg Swelling and Edema [1] Thigh or calf pain AND [2] only 1 side AND [3] present > 1 hour    Final Disposition User   See Physician within 4 Hours (or PCP triage) Julien Girt, RN, Almyra Free    Comments  Caller continues to add parts of his body that are cramping, his hands, both legs w/more edema on the left. He denied abdominal pain or cramping at this time. He verbalized understanding of all instructions and information and will cb as needed.   Referrals  GO TO FACILITY UNDECIDED   Disagree/Comply: Comply

## 2016-06-26 NOTE — Telephone Encounter (Signed)
Called pt, he denies any redness, warm to touch or SOB. He will monitor the swelling today and schedule an appt in the AM if he feels he needs too. Please forward to Butch Penny as a FYI. Thanks.

## 2016-06-26 NOTE — Telephone Encounter (Signed)
Please advise appointment scheduled today @ 6

## 2016-07-01 DIAGNOSIS — J449 Chronic obstructive pulmonary disease, unspecified: Secondary | ICD-10-CM | POA: Insufficient documentation

## 2016-07-01 DIAGNOSIS — E782 Mixed hyperlipidemia: Secondary | ICD-10-CM

## 2016-07-01 HISTORY — DX: Mixed hyperlipidemia: E78.2

## 2016-07-01 HISTORY — DX: Chronic obstructive pulmonary disease, unspecified: J44.9

## 2016-07-03 ENCOUNTER — Ambulatory Visit: Payer: Self-pay | Admitting: General Practice

## 2016-07-29 ENCOUNTER — Ambulatory Visit: Payer: Medicare HMO | Admitting: Podiatry

## 2016-08-05 ENCOUNTER — Ambulatory Visit (INDEPENDENT_AMBULATORY_CARE_PROVIDER_SITE_OTHER): Payer: Medicare HMO | Admitting: Podiatry

## 2016-08-05 ENCOUNTER — Encounter: Payer: Self-pay | Admitting: Podiatry

## 2016-08-05 VITALS — Ht 74.0 in | Wt 208.0 lb

## 2016-08-05 DIAGNOSIS — B351 Tinea unguium: Secondary | ICD-10-CM | POA: Diagnosis not present

## 2016-08-05 DIAGNOSIS — M79676 Pain in unspecified toe(s): Secondary | ICD-10-CM

## 2016-08-05 NOTE — Progress Notes (Signed)
   Subjective:    Patient ID: Eric [name], male    DOB: 20-Jul-1931, 82 y.o.   MRN: [id number]  HPIThis patient presents to the office with long thick painful nails.  He says the nails are painful walking and wearing his shoes.  He has been unable to  Treat himself.  He presents for  Preventive foot care services.  Patient is here today for toenail debridement.  Review of Systems  All other systems reviewed and are negative.      Objective:   Physical Exam GENERAL APPEARANCE: Alert, conversant. Appropriately groomed. No acute distress.  VASCULAR: Pedal pulses palpable at  [facility] and PT bilateral.  Capillary refill time is immediate to all digits,  Normal temperature gradient.  Digital hair growth is present bilateral  NEUROLOGIC: sensation is normal to 5.07 monofilament at 5/5 sites bilateral.  Light touch is intact bilateral, Muscle strength normal.  MUSCULOSKELETAL: acceptable muscle strength, tone and stability bilateral.  Intrinsic muscluature intact bilateral.  Rectus appearance of foot and digits noted bilateral.   DERMATOLOGIC: skin color, texture, and turgor are within normal limits.  No preulcerative lesions or ulcers  are seen, no interdigital maceration noted.  No open lesions present.  . No drainage noted.  NAILS  Thick disfigured discolored nails with subungual debris noted.         Assessment & Plan:  Onychomycosis B/L  IE  Debridement and grinding of long painful nails  RTC 3 months.   Gardiner Barefoot DPM

## 2016-08-27 ENCOUNTER — Ambulatory Visit: Payer: Medicare HMO | Admitting: Internal Medicine

## 2016-09-09 ENCOUNTER — Ambulatory Visit: Payer: Medicare HMO | Admitting: Podiatry

## 2016-09-29 ENCOUNTER — Other Ambulatory Visit: Payer: Self-pay | Admitting: Internal Medicine

## 2016-11-04 ENCOUNTER — Ambulatory Visit: Payer: Medicare HMO | Admitting: Podiatry

## 2016-11-06 ENCOUNTER — Ambulatory Visit: Payer: Medicare HMO | Admitting: Sports Medicine

## 2016-11-12 DIAGNOSIS — R918 Other nonspecific abnormal finding of lung field: Secondary | ICD-10-CM | POA: Insufficient documentation

## 2016-11-12 HISTORY — DX: Other nonspecific abnormal finding of lung field: R91.8

## 2016-11-15 ENCOUNTER — Ambulatory Visit: Payer: Medicare HMO | Admitting: Sports Medicine

## 2016-11-26 ENCOUNTER — Ambulatory Visit: Payer: Medicare HMO | Admitting: Internal Medicine

## 2016-11-27 ENCOUNTER — Ambulatory Visit: Payer: Self-pay | Admitting: Sports Medicine

## 2016-12-10 DIAGNOSIS — R0602 Shortness of breath: Secondary | ICD-10-CM

## 2016-12-10 HISTORY — DX: Shortness of breath: R06.02

## 2016-12-11 ENCOUNTER — Ambulatory Visit: Payer: Self-pay | Admitting: Sports Medicine

## 2016-12-16 ENCOUNTER — Other Ambulatory Visit: Payer: Self-pay | Admitting: Internal Medicine

## 2016-12-16 MED ORDER — FUROSEMIDE 20 MG PO TABS: 40.0000 mg | ORAL_TABLET | Freq: Every day | ORAL | 2 refills | Status: DC

## 2016-12-16 MED ORDER — WARFARIN SODIUM 5 MG PO TABS
ORAL_TABLET | ORAL | 2 refills | Status: DC
Start: 2016-12-16 — End: 2016-12-16

## 2016-12-16 MED ORDER — WARFARIN SODIUM 5 MG PO TABS: ORAL_TABLET | ORAL | 2 refills | Status: DC

## 2016-12-27 ENCOUNTER — Encounter: Payer: Self-pay | Admitting: Sports Medicine

## 2016-12-27 ENCOUNTER — Ambulatory Visit (INDEPENDENT_AMBULATORY_CARE_PROVIDER_SITE_OTHER): Payer: Medicare PPO | Admitting: Sports Medicine

## 2016-12-27 DIAGNOSIS — B351 Tinea unguium: Secondary | ICD-10-CM | POA: Diagnosis not present

## 2016-12-27 DIAGNOSIS — M79676 Pain in unspecified toe(s): Secondary | ICD-10-CM | POA: Diagnosis not present

## 2016-12-27 DIAGNOSIS — I739 Peripheral vascular disease, unspecified: Secondary | ICD-10-CM

## 2016-12-28 NOTE — Progress Notes (Signed)
Subjective: Eric [name] is a 83 y.o. male patient seen today in office with complaint of painful thickened and elongated toenails; unable to trim. Patient denies history of Diabetes, Neuropathy, or Vascular disease. On blood thinner after stroke.  Patient has no other pedal complaints at this time.   Patient Active Problem List   Diagnosis Date Noted  . Diabetes mellitus due to underlying condition without complications ([place]) 13/24/4010  . Back pain 08/12/2014  . Warfarin-induced coagulopathy ([place]) 06/06/2014  . Unsteady gait 06/06/2014  . Encounter for therapeutic drug monitoring 11/30/2013  . Colitis 06/01/2013  . Coagulopathy ([place]) 06/01/2013  . Elevated PSA 12/07/2012  . Osteoarthritis 11/12/2011  . Chest pain 07/28/2011  . Hematuria 11/02/2010  . EPIDIDYMITIS, LEFT 10/22/2010  . GASTRIC ULCER, ACUTE, HEMORRHAGE 05/22/2010  . UPPER GASTROINTESTINAL HEMORRHAGE 05/22/2010  . HYPOTENSION, ORTHOSTATIC 05/15/2010  . Congestive heart failure ([place]) 05/11/2010  . CHEST PAIN UNSPECIFIED 03/20/2010  . NEURALGIA, TRIGEMINAL 09/25/2009  . RECTAL BLEEDING 11/14/2008  . Coronary atherosclerosis 10/23/2007  . VIRAL URI 10/20/2007  . GANGLION CYST, WRIST, LEFT 10/20/2007  . Atrial fibrillation ([place]) 07/23/2007  . HYPERLIPIDEMIA 07/21/2007  . Essential hypertension 07/21/2007  . GERD 07/21/2007  . CEREBROVASCULAR ACCIDENT, HX OF 07/21/2007    Current Outpatient Prescriptions on File Prior to Visit  Medication Sig Dispense Refill  . albuterol (PROAIR HFA) 108 (90 BASE) MCG/ACT inhaler Inhale 2 puffs into the lungs every 6 (six) hours as needed for wheezing or shortness of breath. 1 Inhaler 5  . benzonatate (TESSALON) 100 MG capsule Take 1 capsule (100 mg total) by mouth 2 (two) times daily as needed for cough. 20 capsule 0  . cycloSPORINE (RESTASIS) 0.05 % ophthalmic emulsion     . Difluprednate (DUREZOL) 0.05 % EMUL Place 1 drop into both eyes daily.     Marland Kitchen acetaminophen (TYLENOL) 325  MG tablet Take 650 mg by mouth every 4 (four) hours as needed. pain    . atorvastatin (LIPITOR) 40 MG tablet TAKE 1 TABLET AT BEDTIME 90 tablet 1  . Bimatoprost (LUMIGAN) 0.01 % SOLN Place 1 drop into both eyes at bedtime. Both eyes at hour of sleep    . Calcium Carbonate-Vitamin D (CALCIUM + D) 600-200 MG-UNIT per tablet Take 1 tablet by mouth daily.     . clorazepate (TRANXENE) 3.75 MG tablet Take 1 tablet (3.75 mg total) by mouth 3 (three) times daily as needed for anxiety. 90 tablet 0  . docusate sodium (COLACE) 100 MG capsule Take 100 mg by mouth 2 (two) times daily.     . fexofenadine (ALLEGRA) 180 MG tablet Take 180 mg by mouth daily.     . furosemide (LASIX) 20 MG tablet Take 2 tablets (40 mg total) by mouth daily. 90 tablet 2  . metFORMIN (GLUCOPHAGE-XR) 500 MG 24 hr tablet TAKE 1 TABLET(500 MG) BY MOUTH DAILY WITH BREAKFAST 30 tablet 3  . metoprolol tartrate (LOPRESSOR) 25 MG tablet TAKE 1 TABLET BY MOUTH TWICE DAILY 180 tablet 1  . multivitamin (THERAGRAN) per tablet Take 1 tablet by mouth daily.     . nitroGLYCERIN (NITROSTAT) 0.4 MG SL tablet Place 0.4 mg under the tongue every 5 (five) minutes as needed for chest pain.    . pantoprazole (PROTONIX) 40 MG tablet TAKE 1 TABLET BY MOUTH EVERY DAY 90 tablet 0  . polyethylene glycol powder (GLYCOLAX/MIRALAX) powder Take 17 g by mouth daily. 255 g 0  . warfarin (COUMADIN) 5 MG tablet TAKE AS DIRECTED BY ANTICOAGULATION CLINIC  90 tablet 2   No current facility-administered medications on file prior to visit.     Allergies  Allergen Reactions  . Enoxaparin Sodium Anaphylaxis and Other (See Comments)    angioedema  . Enoxaparin Sodium [Enoxaparin Sodium] Anaphylaxis and Other (See Comments)    angioedema  . Codeine Other (See Comments)    hallucinations hallucinations  . Hydrocodone-Acetaminophen Other (See Comments)     hallucinations Other reaction(s): GI Upset (intolerance)  . Oxycodone Hcl Other (See Comments)     hallucinations  . Oxycodone Hcl [Oxycodone Hcl] Other (See Comments)    hallucinations  . Vicodin [Hydrocodone-Acetaminophen] Other (See Comments)    hallucinations  . Sulfonamide Derivatives Rash  . Tramadol Rash    Objective: Physical Exam  General: Well developed, nourished, no acute distress, awake, alert and oriented x 3  Vascular: Dorsalis pedis artery 1/4 bilateral, Posterior tibial artery 1/4 bilateral, skin temperature warm to warm proximal to distal bilateral lower extremities, + varicosities, scant pedal hair present bilateral.  Neurological: Gross sensation present via light touch bilateral.   Dermatological: Skin is warm, dry, and supple bilateral, Nails 1-10 are tender, long, thick, and discolored with mild subungal debris, no webspace macerations present bilateral, no open lesions present bilateral, no callus/corns/hyperkeratotic tissue present bilateral. No signs of infection bilateral.  Musculoskeletal: No symptomatic boney deformities noted bilateral. Muscular strength within normal limits without painon range of motion. No pain with calf compression bilateral.  Assessment and Plan:  Problem List Items Addressed This Visit    None    Visit Diagnoses    Pain due to onychomycosis of toenail    -  Primary   PVD (peripheral vascular disease) ([place])       Relevant Medications   aspirin EC 81 MG tablet      -Examined patient.  -Discussed treatment options for painful mycotic nails. -Mechanically debrided and reduced mycotic nails with sterile nail nipper and dremel nail file without incident. -Patient to return in 3 months for follow up evaluation or sooner if symptoms worsen.  Landis Martins, DPM

## 2017-03-28 ENCOUNTER — Ambulatory Visit (INDEPENDENT_AMBULATORY_CARE_PROVIDER_SITE_OTHER): Payer: Medicare PPO | Admitting: Sports Medicine

## 2017-03-28 DIAGNOSIS — M79676 Pain in unspecified toe(s): Secondary | ICD-10-CM

## 2017-03-28 DIAGNOSIS — I739 Peripheral vascular disease, unspecified: Secondary | ICD-10-CM | POA: Diagnosis not present

## 2017-03-28 DIAGNOSIS — B351 Tinea unguium: Secondary | ICD-10-CM | POA: Diagnosis not present

## 2017-03-28 NOTE — Progress Notes (Signed)
Subjective: Eric [name] is a 83 y.o. male patient seen today in office with complaint of painful thickened and elongated toenails; unable to trim. Patient denies any changes with medications or medial history since last visit.  Patient has no other pedal complaints at this time.   Patient Active Problem List   Diagnosis Date Noted  . Diabetes mellitus due to underlying condition without complications ([place]) 28/41/3244  . Back pain 08/12/2014  . Warfarin-induced coagulopathy ([place]) 06/06/2014  . Unsteady gait 06/06/2014  . Encounter for therapeutic drug monitoring 11/30/2013  . Colitis 06/01/2013  . Coagulopathy ([place]) 06/01/2013  . Elevated PSA 12/07/2012  . Osteoarthritis 11/12/2011  . Chest pain 07/28/2011  . Hematuria 11/02/2010  . EPIDIDYMITIS, LEFT 10/22/2010  . GASTRIC ULCER, ACUTE, HEMORRHAGE 05/22/2010  . UPPER GASTROINTESTINAL HEMORRHAGE 05/22/2010  . HYPOTENSION, ORTHOSTATIC 05/15/2010  . Congestive heart failure ([place]) 05/11/2010  . CHEST PAIN UNSPECIFIED 03/20/2010  . NEURALGIA, TRIGEMINAL 09/25/2009  . RECTAL BLEEDING 11/14/2008  . Coronary atherosclerosis 10/23/2007  . VIRAL URI 10/20/2007  . GANGLION CYST, WRIST, LEFT 10/20/2007  . Atrial fibrillation ([place]) 07/23/2007  . HYPERLIPIDEMIA 07/21/2007  . Essential hypertension 07/21/2007  . GERD 07/21/2007  . CEREBROVASCULAR ACCIDENT, HX OF 07/21/2007    Current Outpatient Prescriptions on File Prior to Visit  Medication Sig Dispense Refill  . acetaminophen (TYLENOL) 325 MG tablet Take 650 mg by mouth every 4 (four) hours as needed. pain    . albuterol (PROAIR HFA) 108 (90 BASE) MCG/ACT inhaler Inhale 2 puffs into the lungs every 6 (six) hours as needed for wheezing or shortness of breath. 1 Inhaler 5  . aspirin EC 81 MG tablet Take 81 mg by mouth daily.    Marland Kitchen atorvastatin (LIPITOR) 40 MG tablet TAKE 1 TABLET AT BEDTIME 90 tablet 1  . benzonatate (TESSALON) 100 MG capsule Take 1 capsule (100 mg total) by mouth 2  (two) times daily as needed for cough. 20 capsule 0  . Bimatoprost (LUMIGAN) 0.01 % SOLN Place 1 drop into both eyes at bedtime. Both eyes at hour of sleep    . Calcium Carbonate-Vitamin D (CALCIUM + D) 600-200 MG-UNIT per tablet Take 1 tablet by mouth daily.     . clorazepate (TRANXENE) 3.75 MG tablet Take 1 tablet (3.75 mg total) by mouth 3 (three) times daily as needed for anxiety. 90 tablet 0  . cycloSPORINE (RESTASIS) 0.05 % ophthalmic emulsion     . Difluprednate (DUREZOL) 0.05 % EMUL Place 1 drop into both eyes daily.     Marland Kitchen docusate sodium (COLACE) 100 MG capsule Take 100 mg by mouth 2 (two) times daily.     . fexofenadine (ALLEGRA) 180 MG tablet Take 180 mg by mouth daily.     . furosemide (LASIX) 20 MG tablet Take 2 tablets (40 mg total) by mouth daily. 90 tablet 2  . metFORMIN (GLUCOPHAGE-XR) 500 MG 24 hr tablet TAKE 1 TABLET(500 MG) BY MOUTH DAILY WITH BREAKFAST 30 tablet 3  . metoprolol tartrate (LOPRESSOR) 25 MG tablet TAKE 1 TABLET BY MOUTH TWICE DAILY 180 tablet 1  . multivitamin (THERAGRAN) per tablet Take 1 tablet by mouth daily.     . nitroGLYCERIN (NITROSTAT) 0.4 MG SL tablet Place 0.4 mg under the tongue every 5 (five) minutes as needed for chest pain.    . pantoprazole (PROTONIX) 40 MG tablet TAKE 1 TABLET BY MOUTH EVERY DAY 90 tablet 0  . polyethylene glycol powder (GLYCOLAX/MIRALAX) powder Take 17 g by mouth daily. 255 g 0  .  warfarin (COUMADIN) 5 MG tablet TAKE AS DIRECTED BY ANTICOAGULATION CLINIC 90 tablet 2   No current facility-administered medications on file prior to visit.     Allergies  Allergen Reactions  . Enoxaparin Sodium Anaphylaxis and Other (See Comments)    angioedema  . Enoxaparin Sodium [Enoxaparin Sodium] Anaphylaxis and Other (See Comments)    angioedema  . Codeine Other (See Comments)    hallucinations hallucinations  . Hydrocodone-Acetaminophen Other (See Comments)     hallucinations Other reaction(s): GI Upset (intolerance)  . Oxycodone  Hcl Other (See Comments)    hallucinations  . Oxycodone Hcl [Oxycodone Hcl] Other (See Comments)    hallucinations  . Vicodin [Hydrocodone-Acetaminophen] Other (See Comments)    hallucinations  . Sulfonamide Derivatives Rash  . Tramadol Rash    Objective: Physical Exam  General: Well developed, nourished, no acute distress, awake, alert and oriented x 3  Vascular: Dorsalis pedis artery 1/4 bilateral, Posterior tibial artery 1/4 bilateral, skin temperature warm to warm proximal to distal bilateral lower extremities, + varicosities, scant pedal hair present bilateral.  Neurological: Gross sensation present via light touch bilateral.   Dermatological: Skin is warm, dry, and supple bilateral, Nails 1-10 are tender, long, thick, and discolored with mild subungal debris, no webspace macerations present bilateral, no open lesions present bilateral, no callus/corns/hyperkeratotic tissue present bilateral. No signs of infection bilateral.  Musculoskeletal: No symptomatic boney deformities noted bilateral. Muscular strength within normal limits without painon range of motion. No pain with calf compression bilateral.  Assessment and Plan:  Problem List Items Addressed This Visit    None    Visit Diagnoses    Pain due to onychomycosis of toenail    -  Primary   PVD (peripheral vascular disease) ([place])          -Examined patient.  -Discussed treatment options for painful mycotic nails. -Mechanically debrided and reduced mycotic nails with sterile nail nipper and dremel nail file without incident. -Patient to return in 3 months for follow up evaluation or sooner if symptoms worsen.  Landis Martins, DPM

## 2017-04-16 ENCOUNTER — Other Ambulatory Visit: Payer: Self-pay | Admitting: Internal Medicine

## 2017-06-06 ENCOUNTER — Ambulatory Visit: Payer: Medicare PPO | Admitting: Sports Medicine

## 2017-06-18 ENCOUNTER — Ambulatory Visit (INDEPENDENT_AMBULATORY_CARE_PROVIDER_SITE_OTHER): Payer: Medicare PPO | Admitting: Sports Medicine

## 2017-06-18 ENCOUNTER — Encounter (INDEPENDENT_AMBULATORY_CARE_PROVIDER_SITE_OTHER): Payer: Self-pay

## 2017-06-18 DIAGNOSIS — I739 Peripheral vascular disease, unspecified: Secondary | ICD-10-CM | POA: Diagnosis not present

## 2017-06-18 DIAGNOSIS — B351 Tinea unguium: Secondary | ICD-10-CM | POA: Diagnosis not present

## 2017-06-18 DIAGNOSIS — Z7901 Long term (current) use of anticoagulants: Secondary | ICD-10-CM

## 2017-06-18 DIAGNOSIS — M79676 Pain in unspecified toe(s): Secondary | ICD-10-CM

## 2017-06-18 NOTE — Progress Notes (Signed)
Subjective: Eric [name] is a 83 y.o. male patient seen today in office with complaint of painful thickened and elongated toenails; unable to trim. Patient denies any changes with medications or medial history since last visit.  Reports that his nails grow out too long between visits and would like to come sooner. Patient has no other pedal complaints at this time.   Patient Active Problem List   Diagnosis Date Noted  . Diabetes mellitus due to underlying condition without complications ([place]) 81/09/7508  . Back pain 08/12/2014  . Warfarin-induced coagulopathy ([place]) 06/06/2014  . Unsteady gait 06/06/2014  . Encounter for therapeutic drug monitoring 11/30/2013  . Colitis 06/01/2013  . Coagulopathy ([place]) 06/01/2013  . Elevated PSA 12/07/2012  . Osteoarthritis 11/12/2011  . Chest pain 07/28/2011  . Hematuria 11/02/2010  . EPIDIDYMITIS, LEFT 10/22/2010  . GASTRIC ULCER, ACUTE, HEMORRHAGE 05/22/2010  . UPPER GASTROINTESTINAL HEMORRHAGE 05/22/2010  . HYPOTENSION, ORTHOSTATIC 05/15/2010  . Congestive heart failure ([place]) 05/11/2010  . CHEST PAIN UNSPECIFIED 03/20/2010  . NEURALGIA, TRIGEMINAL 09/25/2009  . RECTAL BLEEDING 11/14/2008  . Coronary atherosclerosis 10/23/2007  . VIRAL URI 10/20/2007  . GANGLION CYST, WRIST, LEFT 10/20/2007  . Atrial fibrillation ([place]) 07/23/2007  . HYPERLIPIDEMIA 07/21/2007  . Essential hypertension 07/21/2007  . GERD 07/21/2007  . CEREBROVASCULAR ACCIDENT, HX OF 07/21/2007    Current Outpatient Prescriptions on File Prior to Visit  Medication Sig Dispense Refill  . acetaminophen (TYLENOL) 325 MG tablet Take 650 mg by mouth every 4 (four) hours as needed. pain    . albuterol (PROAIR HFA) 108 (90 BASE) MCG/ACT inhaler Inhale 2 puffs into the lungs every 6 (six) hours as needed for wheezing or shortness of breath. 1 Inhaler 5  . aspirin EC 81 MG tablet Take 81 mg by mouth daily.    Marland Kitchen atorvastatin (LIPITOR) 40 MG tablet TAKE 1 TABLET AT BEDTIME 90 tablet 1   . benzonatate (TESSALON) 100 MG capsule Take 1 capsule (100 mg total) by mouth 2 (two) times daily as needed for cough. 20 capsule 0  . Bimatoprost (LUMIGAN) 0.01 % SOLN Place 1 drop into both eyes at bedtime. Both eyes at hour of sleep    . Calcium Carbonate-Vitamin D (CALCIUM + D) 600-200 MG-UNIT per tablet Take 1 tablet by mouth daily.     . clorazepate (TRANXENE) 3.75 MG tablet Take 1 tablet (3.75 mg total) by mouth 3 (three) times daily as needed for anxiety. 90 tablet 0  . cycloSPORINE (RESTASIS) 0.05 % ophthalmic emulsion     . Difluprednate (DUREZOL) 0.05 % EMUL Place 1 drop into both eyes daily.     Marland Kitchen docusate sodium (COLACE) 100 MG capsule Take 100 mg by mouth 2 (two) times daily.     . fexofenadine (ALLEGRA) 180 MG tablet Take 180 mg by mouth daily.     . furosemide (LASIX) 20 MG tablet TAKE 2 TABLETS EVERY DAY 180 tablet 1  . metFORMIN (GLUCOPHAGE-XR) 500 MG 24 hr tablet TAKE 1 TABLET(500 MG) BY MOUTH DAILY WITH BREAKFAST 30 tablet 3  . metoprolol tartrate (LOPRESSOR) 25 MG tablet TAKE 1 TABLET BY MOUTH TWICE DAILY 180 tablet 1  . multivitamin (THERAGRAN) per tablet Take 1 tablet by mouth daily.     . nitroGLYCERIN (NITROSTAT) 0.4 MG SL tablet Place 0.4 mg under the tongue every 5 (five) minutes as needed for chest pain.    . pantoprazole (PROTONIX) 40 MG tablet TAKE 1 TABLET BY MOUTH EVERY DAY 90 tablet 0  . polyethylene glycol  powder (GLYCOLAX/MIRALAX) powder Take 17 g by mouth daily. 255 g 0  . warfarin (COUMADIN) 5 MG tablet TAKE AS DIRECTED BY ANTICOAGULATION CLINIC 90 tablet 2   No current facility-administered medications on file prior to visit.     Allergies  Allergen Reactions  . Enoxaparin Sodium Anaphylaxis and Other (See Comments)    angioedema  . Enoxaparin Sodium [Enoxaparin Sodium] Anaphylaxis and Other (See Comments)    angioedema  . Codeine Other (See Comments)    hallucinations hallucinations  . Hydrocodone-Acetaminophen Other (See Comments)      hallucinations Other reaction(s): GI Upset (intolerance)  . Oxycodone Hcl Other (See Comments)    hallucinations  . Oxycodone Hcl [Oxycodone Hcl] Other (See Comments)    hallucinations  . Vicodin [Hydrocodone-Acetaminophen] Other (See Comments)    hallucinations  . Sulfonamide Derivatives Rash  . Tramadol Rash    Objective: Physical Exam  General: Well developed, nourished, no acute distress, awake, alert and oriented x 3  Vascular: Dorsalis pedis artery 1/4 bilateral, Posterior tibial artery 1/4 bilateral, skin temperature warm to warm proximal to distal bilateral lower extremities, + varicosities, scant pedal hair present bilateral.  Neurological: Gross sensation present via light touch bilateral.   Dermatological: Skin is warm, dry, and supple bilateral, Nails 1-10 are tender, long, thick, and discolored with mild subungal debris, no webspace macerations present bilateral, no open lesions present bilateral, no callus/corns/hyperkeratotic tissue present bilateral. No signs of infection bilateral.  Musculoskeletal: No symptomatic boney deformities noted bilateral. Muscular strength within normal limits without painon range of motion. No pain with calf compression bilateral.  Assessment and Plan:  Problem List Items Addressed This Visit    None    Visit Diagnoses    Pain due to onychomycosis of toenail    -  Primary   PVD (peripheral vascular disease) ([place])       Anticoagulant long-term use          -Examined patient.  -Discussed treatment options for painful mycotic nails. -Mechanically debrided and reduced mycotic nails with sterile nail nipper and dremel nail file without incident. -Patient to return in 62 days for follow up evaluation or sooner if symptoms worsen.  Landis Martins, DPM

## 2017-08-20 ENCOUNTER — Encounter: Payer: Self-pay | Admitting: Sports Medicine

## 2017-08-20 ENCOUNTER — Ambulatory Visit: Payer: Medicare PPO | Admitting: Sports Medicine

## 2017-08-20 DIAGNOSIS — M79676 Pain in unspecified toe(s): Secondary | ICD-10-CM | POA: Diagnosis not present

## 2017-08-20 DIAGNOSIS — B351 Tinea unguium: Secondary | ICD-10-CM | POA: Diagnosis not present

## 2017-08-20 DIAGNOSIS — Z7901 Long term (current) use of anticoagulants: Secondary | ICD-10-CM

## 2017-08-20 DIAGNOSIS — I739 Peripheral vascular disease, unspecified: Secondary | ICD-10-CM

## 2017-08-20 NOTE — Progress Notes (Signed)
Subjective: Eric [name] is a 83 y.o. male patient seen today in office with complaint of painful thickened and elongated toenails; unable to trim. Patient denies any changes with medications or medical history since last visit.  Patient reports that he did not know he was diabetic was told that by nurse but does not check for diabetes and does not taking medication.  Reports that he had an issue with ingrown toenail on his left great toe which he had treated at another place.  Patient has no other pedal complaints at this time.   Patient Active Problem List   Diagnosis Date Noted  . Diabetes mellitus due to underlying condition without complications ([place]) 02/72/5366  . Back pain 08/12/2014  . Warfarin-induced coagulopathy ([place]) 06/06/2014  . Unsteady gait 06/06/2014  . Encounter for therapeutic drug monitoring 11/30/2013  . Colitis 06/01/2013  . Coagulopathy ([place]) 06/01/2013  . Elevated PSA 12/07/2012  . Osteoarthritis 11/12/2011  . Chest pain 07/28/2011  . Hematuria 11/02/2010  . EPIDIDYMITIS, LEFT 10/22/2010  . GASTRIC ULCER, ACUTE, HEMORRHAGE 05/22/2010  . UPPER GASTROINTESTINAL HEMORRHAGE 05/22/2010  . HYPOTENSION, ORTHOSTATIC 05/15/2010  . Congestive heart failure ([place]) 05/11/2010  . CHEST PAIN UNSPECIFIED 03/20/2010  . NEURALGIA, TRIGEMINAL 09/25/2009  . RECTAL BLEEDING 11/14/2008  . Coronary atherosclerosis 10/23/2007  . VIRAL URI 10/20/2007  . GANGLION CYST, WRIST, LEFT 10/20/2007  . Atrial fibrillation ([place]) 07/23/2007  . HYPERLIPIDEMIA 07/21/2007  . Essential hypertension 07/21/2007  . GERD 07/21/2007  . CEREBROVASCULAR ACCIDENT, HX OF 07/21/2007    Current Outpatient Medications on File Prior to Visit  Medication Sig Dispense Refill  . acetaminophen (TYLENOL) 325 MG tablet Take 650 mg by mouth every 4 (four) hours as needed. pain    . albuterol (PROAIR HFA) 108 (90 BASE) MCG/ACT inhaler Inhale 2 puffs into the lungs every 6 (six) hours as needed for wheezing or  shortness of breath. 1 Inhaler 5  . aspirin EC 81 MG tablet Take 81 mg by mouth daily.    Marland Kitchen atorvastatin (LIPITOR) 40 MG tablet TAKE 1 TABLET AT BEDTIME 90 tablet 1  . benzonatate (TESSALON) 100 MG capsule Take 1 capsule (100 mg total) by mouth 2 (two) times daily as needed for cough. 20 capsule 0  . Bimatoprost (LUMIGAN) 0.01 % SOLN Place 1 drop into both eyes at bedtime. Both eyes at hour of sleep    . Calcium Carbonate-Vitamin D (CALCIUM + D) 600-200 MG-UNIT per tablet Take 1 tablet by mouth daily.     . clorazepate (TRANXENE) 3.75 MG tablet Take 1 tablet (3.75 mg total) by mouth 3 (three) times daily as needed for anxiety. 90 tablet 0  . cycloSPORINE (RESTASIS) 0.05 % ophthalmic emulsion     . Difluprednate (DUREZOL) 0.05 % EMUL Place 1 drop into both eyes daily.     Marland Kitchen docusate sodium (COLACE) 100 MG capsule Take 100 mg by mouth 2 (two) times daily.     . fexofenadine (ALLEGRA) 180 MG tablet Take 180 mg by mouth daily.     . furosemide (LASIX) 20 MG tablet TAKE 2 TABLETS EVERY DAY 180 tablet 1  . metFORMIN (GLUCOPHAGE-XR) 500 MG 24 hr tablet TAKE 1 TABLET(500 MG) BY MOUTH DAILY WITH BREAKFAST 30 tablet 3  . metoprolol tartrate (LOPRESSOR) 25 MG tablet TAKE 1 TABLET BY MOUTH TWICE DAILY 180 tablet 1  . multivitamin (THERAGRAN) per tablet Take 1 tablet by mouth daily.     . nitroGLYCERIN (NITROSTAT) 0.4 MG SL tablet Place 0.4 mg under the  tongue every 5 (five) minutes as needed for chest pain.    . pantoprazole (PROTONIX) 40 MG tablet TAKE 1 TABLET BY MOUTH EVERY DAY 90 tablet 0  . polyethylene glycol powder (GLYCOLAX/MIRALAX) powder Take 17 g by mouth daily. 255 g 0  . warfarin (COUMADIN) 5 MG tablet TAKE AS DIRECTED BY ANTICOAGULATION CLINIC 90 tablet 2   No current facility-administered medications on file prior to visit.     Allergies  Allergen Reactions  . Enoxaparin Sodium Anaphylaxis and Other (See Comments)    angioedema  . Enoxaparin Sodium [Enoxaparin Sodium] Anaphylaxis and  Other (See Comments)    angioedema  . Codeine Other (See Comments)    hallucinations hallucinations  . Hydrocodone-Acetaminophen Other (See Comments)     hallucinations Other reaction(s): GI Upset (intolerance)  . Oxycodone Hcl Other (See Comments)    hallucinations  . Oxycodone Hcl [Oxycodone Hcl] Other (See Comments)    hallucinations  . Vicodin [Hydrocodone-Acetaminophen] Other (See Comments)    hallucinations  . Sulfonamide Derivatives Rash  . Tramadol Rash    Objective: Physical Exam  General: Well developed, nourished, no acute distress, awake, alert and oriented x 3  Vascular: Dorsalis pedis artery 1/4 bilateral, Posterior tibial artery 1/4 bilateral, skin temperature warm to warm proximal to distal bilateral lower extremities, + varicosities, scant pedal hair present bilateral.  Neurological: Gross sensation present via light touch bilateral.   Dermatological: Skin is warm, dry, and supple bilateral, Nails 1-10 are tender, long, thick, and discolored with mild subungal debris, no webspace macerations present bilateral, no open lesions present bilateral, no callus/corns/hyperkeratotic tissue present bilateral. No signs of infection bilateral.  Musculoskeletal: No symptomatic boney deformities noted bilateral. Muscular strength within normal limits without painon range of motion. No pain with calf compression bilateral.  Assessment and Plan:  Problem List Items Addressed This Visit    None    Visit Diagnoses    Pain due to onychomycosis of toenail    -  Primary   PVD (peripheral vascular disease) ([place])       Anticoagulant long-term use          -Examined patient.  -Discussed treatment options for painful mycotic nails. -Mechanically debrided and reduced mycotic nails with sterile nail nipper and dremel nail file without incident. -Patient to return in 62 days for follow up evaluation or sooner if symptoms worsen.  Landis Martins, DPM

## 2017-09-02 ENCOUNTER — Other Ambulatory Visit: Payer: Self-pay | Admitting: Internal Medicine

## 2017-10-22 ENCOUNTER — Ambulatory Visit: Payer: Medicare PPO | Admitting: Sports Medicine

## 2017-10-22 IMAGING — DX DG CHEST 2V
2 series · 2 of 2 positions shown · non-contrast
Comparison: PA and lateral chest x-ray August 03, 2014

CLINICAL DATA: Cough and chest congestion for the past 2 months,
bilateral upper abdominal pain with cramping, history of previous
smoking, coronary artery disease, CHF, and atrial fibrillation.

EXAM:
CHEST  2 VIEW

[chest pa]
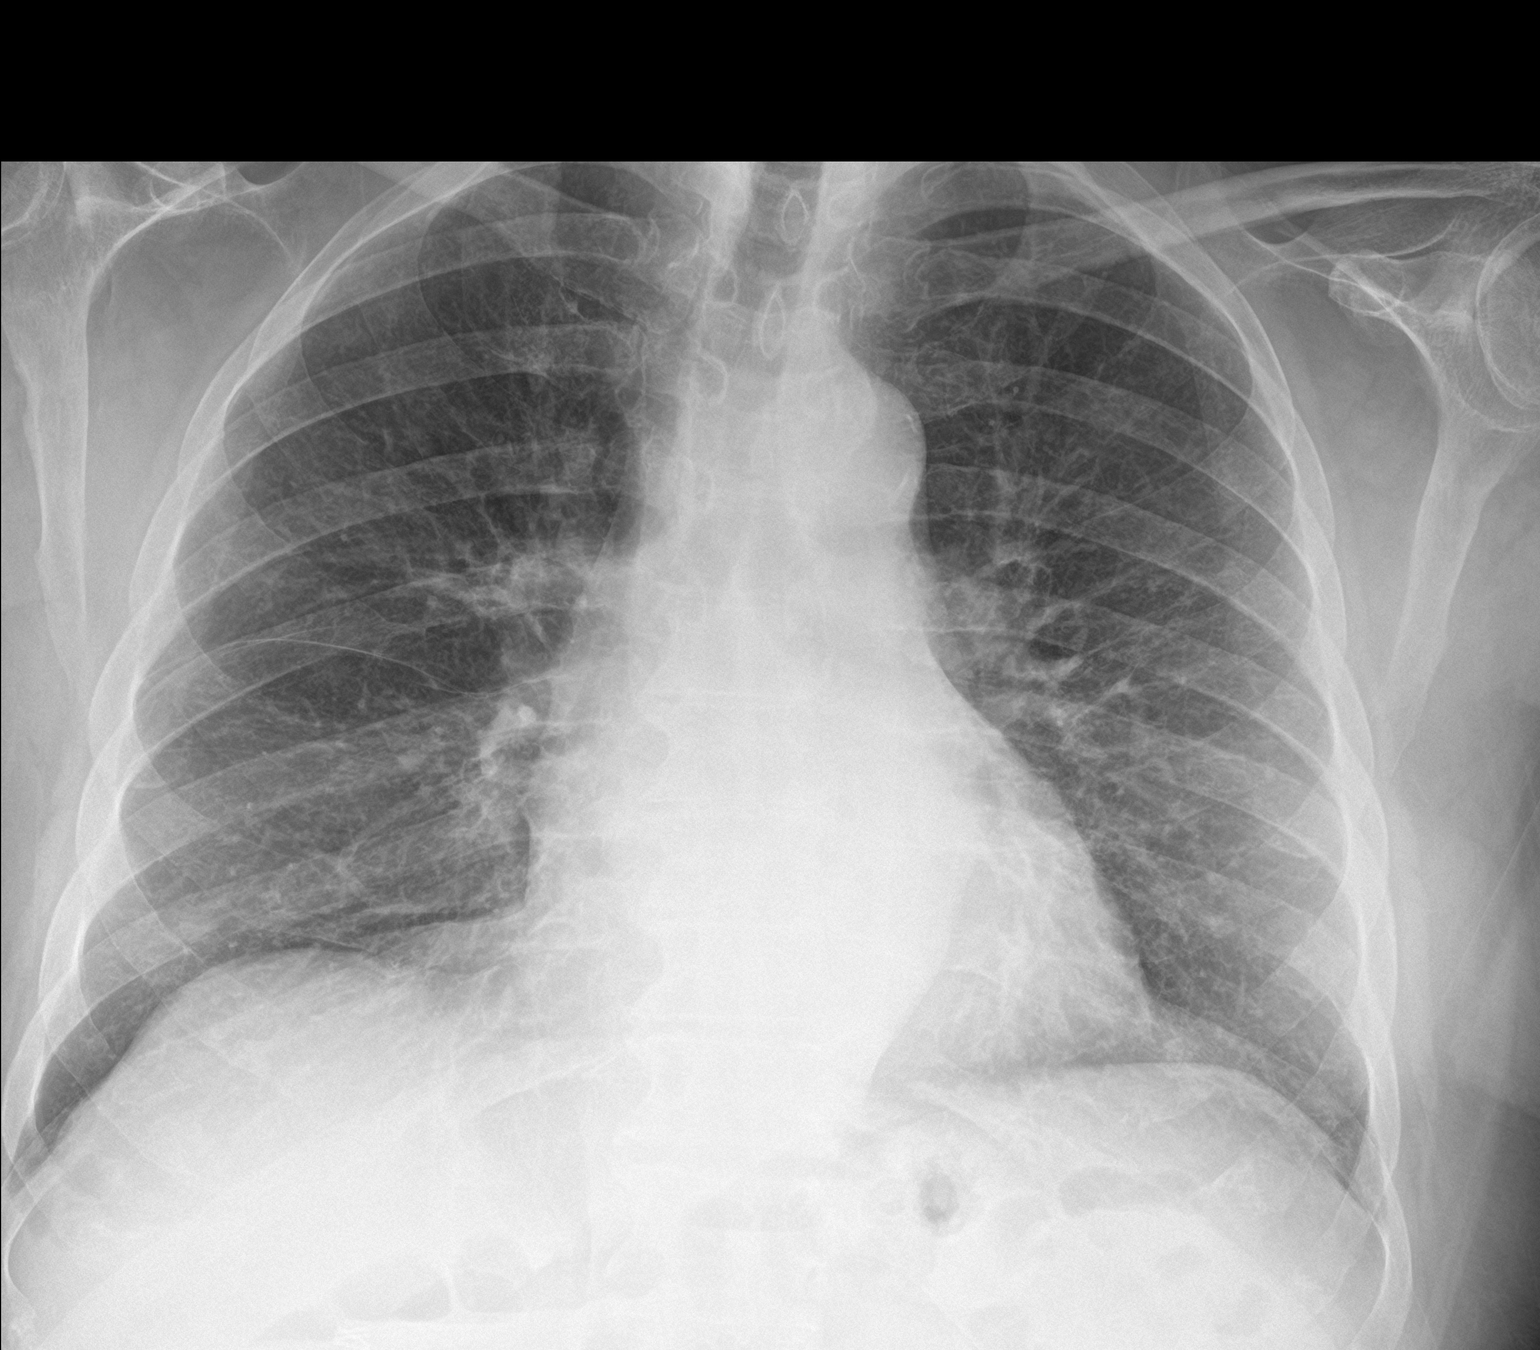

[chest lat]
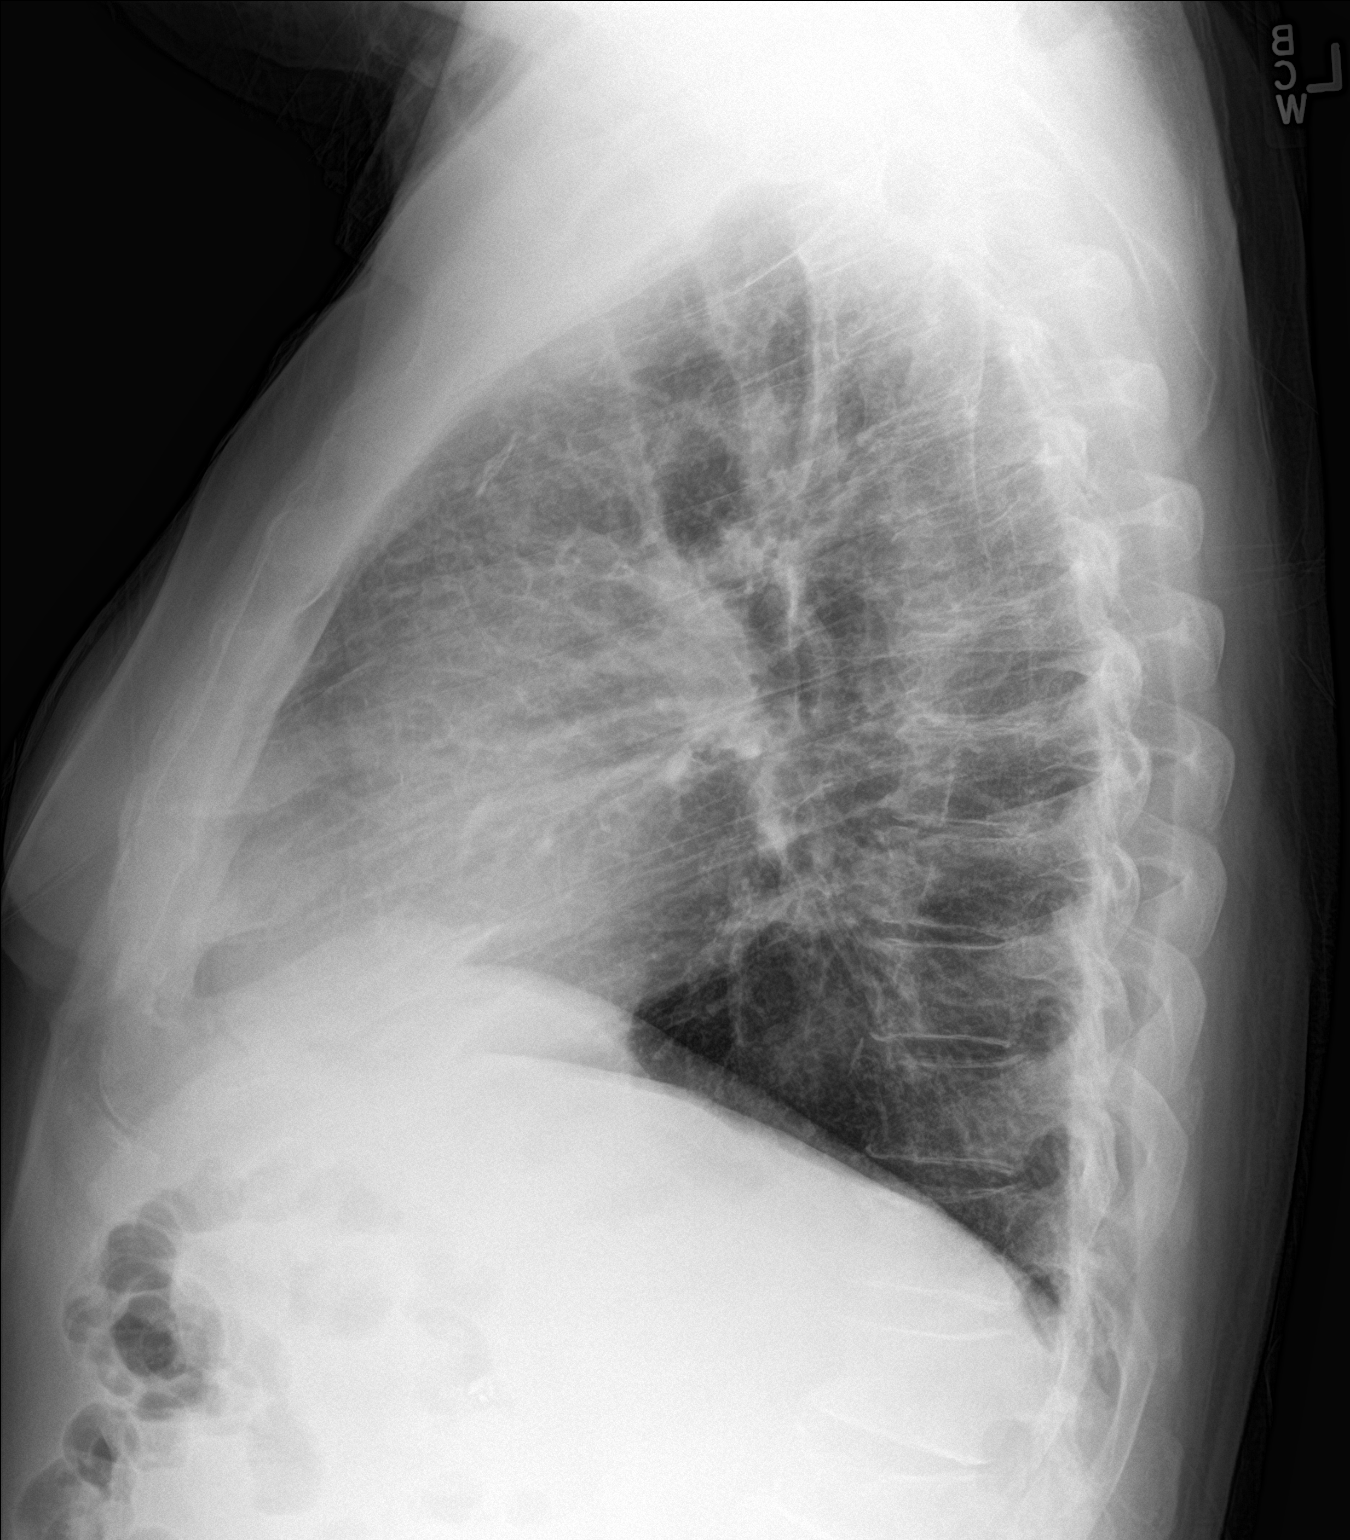

[2 of 2 positions shown; findings below may reference images not displayed]

FINDINGS: The lungs are adequately inflated. The interstitial markings are
coarse. There is no alveolar infiltrate. There is no pleural
effusion. The heart is normal in size. The pulmonary vascularity is
not engorged. The mediastinum is normal in width. There is
calcification in the wall of the aortic arch. There is multilevel
degenerative disc disease of the thoracic spine.
IMPRESSION: Chronic bronchitic changes.  There is no alveolar pneumonia nor CHF.

Aortic atherosclerosis.

## 2017-11-17 DIAGNOSIS — R634 Abnormal weight loss: Secondary | ICD-10-CM

## 2017-11-17 DIAGNOSIS — D649 Anemia, unspecified: Secondary | ICD-10-CM | POA: Insufficient documentation

## 2017-11-17 DIAGNOSIS — R042 Hemoptysis: Secondary | ICD-10-CM

## 2017-11-17 DIAGNOSIS — A0472 Enterocolitis due to Clostridium difficile, not specified as recurrent: Secondary | ICD-10-CM | POA: Insufficient documentation

## 2017-11-17 HISTORY — DX: Enterocolitis due to Clostridium difficile, not specified as recurrent: A04.72

## 2017-11-17 HISTORY — DX: Hemoptysis: R04.2

## 2017-11-17 HISTORY — DX: Anemia, unspecified: D64.9

## 2017-11-17 HISTORY — DX: Abnormal weight loss: R63.4

## 2017-11-21 DIAGNOSIS — N183 Chronic kidney disease, stage 3 unspecified: Secondary | ICD-10-CM

## 2017-11-21 DIAGNOSIS — R748 Abnormal levels of other serum enzymes: Secondary | ICD-10-CM | POA: Insufficient documentation

## 2017-11-21 HISTORY — DX: Abnormal levels of other serum enzymes: R74.8

## 2017-11-21 HISTORY — DX: Chronic kidney disease, stage 3 unspecified: N18.30

## 2017-11-30 DIAGNOSIS — I10 Essential (primary) hypertension: Secondary | ICD-10-CM | POA: Diagnosis not present

## 2017-11-30 DIAGNOSIS — A0472 Enterocolitis due to Clostridium difficile, not specified as recurrent: Secondary | ICD-10-CM

## 2017-11-30 DIAGNOSIS — I482 Chronic atrial fibrillation: Secondary | ICD-10-CM

## 2017-11-30 DIAGNOSIS — E785 Hyperlipidemia, unspecified: Secondary | ICD-10-CM

## 2017-11-30 DIAGNOSIS — E876 Hypokalemia: Secondary | ICD-10-CM | POA: Diagnosis not present

## 2017-12-01 ENCOUNTER — Ambulatory Visit: Payer: Medicare PPO | Admitting: Podiatry

## 2017-12-01 DIAGNOSIS — I10 Essential (primary) hypertension: Secondary | ICD-10-CM | POA: Diagnosis not present

## 2017-12-01 DIAGNOSIS — A0472 Enterocolitis due to Clostridium difficile, not specified as recurrent: Secondary | ICD-10-CM | POA: Diagnosis not present

## 2017-12-01 DIAGNOSIS — E876 Hypokalemia: Secondary | ICD-10-CM | POA: Diagnosis not present

## 2017-12-01 DIAGNOSIS — I482 Chronic atrial fibrillation: Secondary | ICD-10-CM | POA: Diagnosis not present

## 2017-12-01 DIAGNOSIS — E785 Hyperlipidemia, unspecified: Secondary | ICD-10-CM | POA: Diagnosis not present

## 2017-12-02 DIAGNOSIS — I482 Chronic atrial fibrillation: Secondary | ICD-10-CM | POA: Diagnosis not present

## 2017-12-02 DIAGNOSIS — I10 Essential (primary) hypertension: Secondary | ICD-10-CM | POA: Diagnosis not present

## 2017-12-02 DIAGNOSIS — E785 Hyperlipidemia, unspecified: Secondary | ICD-10-CM | POA: Diagnosis not present

## 2017-12-02 DIAGNOSIS — A0472 Enterocolitis due to Clostridium difficile, not specified as recurrent: Secondary | ICD-10-CM | POA: Diagnosis not present

## 2017-12-03 DIAGNOSIS — E785 Hyperlipidemia, unspecified: Secondary | ICD-10-CM | POA: Diagnosis not present

## 2017-12-03 DIAGNOSIS — I482 Chronic atrial fibrillation: Secondary | ICD-10-CM | POA: Diagnosis not present

## 2017-12-03 DIAGNOSIS — E876 Hypokalemia: Secondary | ICD-10-CM | POA: Diagnosis not present

## 2017-12-03 DIAGNOSIS — A0472 Enterocolitis due to Clostridium difficile, not specified as recurrent: Secondary | ICD-10-CM | POA: Diagnosis not present

## 2017-12-03 DIAGNOSIS — I10 Essential (primary) hypertension: Secondary | ICD-10-CM | POA: Diagnosis not present

## 2017-12-04 DIAGNOSIS — E785 Hyperlipidemia, unspecified: Secondary | ICD-10-CM | POA: Diagnosis not present

## 2017-12-04 DIAGNOSIS — I482 Chronic atrial fibrillation: Secondary | ICD-10-CM | POA: Diagnosis not present

## 2017-12-04 DIAGNOSIS — A0472 Enterocolitis due to Clostridium difficile, not specified as recurrent: Secondary | ICD-10-CM | POA: Diagnosis not present

## 2017-12-04 DIAGNOSIS — I10 Essential (primary) hypertension: Secondary | ICD-10-CM | POA: Diagnosis not present

## 2017-12-10 DIAGNOSIS — E1122 Type 2 diabetes mellitus with diabetic chronic kidney disease: Secondary | ICD-10-CM

## 2017-12-10 DIAGNOSIS — N183 Chronic kidney disease, stage 3 (moderate): Secondary | ICD-10-CM

## 2017-12-10 HISTORY — DX: Type 2 diabetes mellitus with diabetic chronic kidney disease: E11.22

## 2017-12-16 ENCOUNTER — Other Ambulatory Visit: Payer: Self-pay | Admitting: Internal Medicine

## 2018-01-20 ENCOUNTER — Ambulatory Visit (INDEPENDENT_AMBULATORY_CARE_PROVIDER_SITE_OTHER): Payer: Medicare PPO | Admitting: Podiatry

## 2018-01-20 DIAGNOSIS — M79676 Pain in unspecified toe(s): Secondary | ICD-10-CM | POA: Diagnosis not present

## 2018-01-20 DIAGNOSIS — B351 Tinea unguium: Secondary | ICD-10-CM

## 2018-01-20 DIAGNOSIS — D689 Coagulation defect, unspecified: Secondary | ICD-10-CM

## 2018-01-20 NOTE — Progress Notes (Signed)
1   Subjective:  Patient ID: Eric [name], male    DOB: [birth year],  MRN: [id number]  Chief Complaint  Patient presents with  . debride    B/L nail trimming -not diabetic   84 y.o. male returns for the above complaint. Reports elongated toenails. On coumadin. Missed his last appointment.  Objective:  There were no vitals filed for this visit. General AA&O x3. Normal mood and affect.  Vascular Pedal pulses palpable.  Neurologic Epicritic sensation grossly intact.  Dermatologic No open lesions. Skin normal texture and turgor. Toenails x 10 elongated, thickened, dystrophic.  Orthopedic: Pain to palpation about the toenails.   Assessment & Plan:  Patient was evaluated and treated and all questions answered.  Onychomycosis with pain  -Nails palliatively debrided as below. -Educated on self-care  Procedure: Nail Debridement Rationale: coagulation defect. Type of Debridement: manual, sharp debridement. Instrumentation: Nail nipper, rotary burr. Number of Nails: 10     No follow-ups on file.

## 2018-02-26 ENCOUNTER — Other Ambulatory Visit: Payer: Self-pay | Admitting: Internal Medicine

## 2018-02-27 NOTE — Telephone Encounter (Signed)
Eric [name]

## 2018-03-24 ENCOUNTER — Ambulatory Visit: Payer: Medicare PPO | Admitting: Podiatry

## 2018-06-23 ENCOUNTER — Ambulatory Visit: Payer: Medicare PPO | Admitting: Cardiology

## 2018-06-24 ENCOUNTER — Encounter: Payer: Self-pay | Admitting: *Deleted

## 2018-06-24 ENCOUNTER — Encounter: Payer: Self-pay | Admitting: Cardiology

## 2018-06-24 ENCOUNTER — Ambulatory Visit (INDEPENDENT_AMBULATORY_CARE_PROVIDER_SITE_OTHER): Payer: Medicare PPO | Admitting: Cardiology

## 2018-06-24 VITALS — BP 100/58 | HR 65 | Ht 74.0 in | Wt 210.0 lb

## 2018-06-24 DIAGNOSIS — I119 Hypertensive heart disease without heart failure: Secondary | ICD-10-CM | POA: Diagnosis not present

## 2018-06-24 DIAGNOSIS — I482 Chronic atrial fibrillation, unspecified: Secondary | ICD-10-CM | POA: Diagnosis not present

## 2018-06-24 DIAGNOSIS — Z7901 Long term (current) use of anticoagulants: Secondary | ICD-10-CM | POA: Insufficient documentation

## 2018-06-24 HISTORY — DX: Long term (current) use of anticoagulants: Z79.01

## 2018-06-24 MED ORDER — METOPROLOL SUCCINATE ER 25 MG PO TB24: 12.5000 mg | ORAL_TABLET | Freq: Every day | ORAL | Status: DC

## 2018-06-24 NOTE — Patient Instructions (Signed)
Medication Instructions:  Your physician has recommended you make the following change in your medication:   DECREASE Toprolol XL 25mg  to 1/2 tablet daily = 12.5mg  daily   Labwork: NONE  Testing/Procedures: You had an EKG  Follow-Up: Your physician wants you to follow-up in: 6 months You will receive a reminder letter in the mail two months in advance. If you don't receive a letter, please call our office to schedule the follow-up appointment.   Any Other Special Instructions Will Be Listed Below (If Applicable).     If you need a refill on your cardiac medications before your next appointment, please call your pharmacy.

## 2018-06-24 NOTE — Progress Notes (Signed)
Cardiology Office Note:    Date:  06/24/2018   ID:  Eric [name], DOB 08-27-1931, MRN [id number]  PCP:  Raina Mina., MD  Cardiologist:  Shirlee More, MD    Referring MD: Raina Mina., MD    ASSESSMENT:    1. Chronic atrial fibrillation   2. Chronic anticoagulation   3. Hypertensive heart disease without heart failure    PLAN:    In order of problems listed above:  1. His rate is relatively slow 65 bpm decrease the dose of his beta-blocker 50% and asked him to monitor home heart rates contact me if it remains less than 60 and continue anticoagulation with warfarin managed with his PCP goal INR is 2.5 2. Stable continue his anticoagulant 3. Stable he has no edema he is compensated continue his minimum dose of loop diuretic and beta-blocker.   Next appointment: 6 months after changing medications for heart rate control of atrial fibrillation   Medication Adjustments/Labs and Tests Ordered: Current medicines are reviewed at length with the patient today.  Concerns regarding medicines are outlined above.  No orders of the defined types were placed in this encounter.  No orders of the defined types were placed in this encounter.   Chief Complaint  Patient presents with  . Follow-up  . Atrial Fibrillation    History of Present Illness:    Eric [name] is a 84 y.o. male with a hx of chronic AF on warfarin, non anginal chest pain, hypertension and COPD last seen 12/17/16. Compliance with diet, lifestyle and medications: Yes  In the interim he was admitted with C. difficile colitis and lost 50 pounds body weight total.  He has took him a long time but he thinks he is finally recovered to his previous level of function he has exertional shortness of breath more than mild activities related to COPD and has intermittent fleeting sharp chest discomfort which is been a chronic problem and normal ischemia evaluation.  He has had no edema orthopnea no palpitations  syncope TIA and no bleeding complications of his current anticoagulant Past Medical History:  Diagnosis Date  . Atrial fibrillation ([place])   . Blood transfusion without reported diagnosis   . CAD (coronary artery disease)   . CHF (congestive heart failure) ([place])   . GERD (gastroesophageal reflux disease)   . GI bleed   . Hyperlipidemia   . Hypertension   . NSAID-induced gastric ulcer   . Trigeminal neuralgia     Past Surgical History:  Procedure Laterality Date  . CHOLECYSTECTOMY    . ESOPHAGOGASTRODUODENOSCOPY  2011  . FLEXIBLE SIGMOIDOSCOPY N/A 06/04/2013   Procedure: FLEXIBLE SIGMOIDOSCOPY;  Surgeon: Winfield Cunas., MD;  Location: [facility] ENDOSCOPY;  Service: Endoscopy;  Laterality: N/A;  . HIP SURGERY  August 2008   Right total hip replacement  . SHOULDER SURGERY     Left  . TOTAL KNEE ARTHROPLASTY  June 2009   Left    Current Medications: Current Meds  Medication Sig  . acetaminophen (TYLENOL) 325 MG tablet Take 1 tablet by mouth every 6 (six) hours as needed.   Marland Kitchen albuterol (PROAIR HFA) 108 (90 BASE) MCG/ACT inhaler Inhale 2 puffs into the lungs every 6 (six) hours as needed for wheezing or shortness of breath.  Marland Kitchen atorvastatin (LIPITOR) 40 MG tablet TAKE 1 TABLET AT BEDTIME  . benzonatate (TESSALON) 100 MG capsule Take 1 capsule (100 mg total) by mouth 2 (two) times daily as needed for cough.  Marland Kitchen  Bimatoprost (LUMIGAN) 0.01 % SOLN Place 1 drop into both eyes at bedtime. Both eyes at hour of sleep  . bisacodyl (DULCOLAX) 5 MG EC tablet Take 1 tablet by mouth as needed.  . Calcium Carb-Cholecalciferol (CALCIUM-VITAMIN D) 500-200 MG-UNIT tablet Take 1 tablet by mouth 2 (two) times daily.  . clorazepate (TRANXENE) 3.75 MG tablet Take 1 tablet (3.75 mg total) by mouth 3 (three) times daily as needed for anxiety.  . cycloSPORINE (RESTASIS) 0.05 % ophthalmic emulsion Place 1 drop into both eyes 2 (two) times daily.   . Difluprednate (DUREZOL) 0.05 % EMUL Place 1 drop into both eyes  daily.   Marland Kitchen docusate sodium (COLACE) 100 MG capsule Take 100 mg by mouth 2 (two) times daily.   Marland Kitchen escitalopram (LEXAPRO) 5 MG tablet Take 1 tablet by mouth daily.  . ferrous sulfate 325 (65 FE) MG tablet Take 1 tablet by mouth daily.  . fexofenadine (ALLEGRA) 180 MG tablet Take 180 mg by mouth daily.   . furosemide (LASIX) 20 MG tablet TAKE 2 TABLETS EVERY DAY  . LACTOBACILLUS PO Take 1 capsule by mouth daily.  . metFORMIN (GLUCOPHAGE-XR) 500 MG 24 hr tablet TAKE 1 TABLET(500 MG) BY MOUTH DAILY WITH BREAKFAST  . metoprolol succinate (TOPROL-XL) 25 MG 24 hr tablet Take 1 tablet by mouth daily.  . multivitamin (THERAGRAN) per tablet Take 1 tablet by mouth daily.   . nitroGLYCERIN (NITROSTAT) 0.4 MG SL tablet Place 0.4 mg under the tongue every 5 (five) minutes as needed for chest pain.  . pantoprazole (PROTONIX) 40 MG tablet TAKE 1 TABLET BY MOUTH EVERY DAY  . vitamin C (ASCORBIC ACID) 500 MG tablet Take 1,000 mg by mouth daily.   Marland Kitchen warfarin (COUMADIN) 5 MG tablet TAKE AS DIRECTED BY ANTICOAGULATION CLINIC (Patient taking differently: Currently taking 2.5mg  everyday except 5 mg on M, W, and Fri)     Allergies:   Enoxaparin sodium [enoxaparin sodium]; Codeine; Oxycodone hcl; Vicodin [hydrocodone-acetaminophen]; Sulfonamide derivatives; and Tramadol   Social History   Socioeconomic History  . Marital status: Married    Spouse name: Not on file  . Number of children: Not on file  . Years of education: Not on file  . Highest education level: Not on file  Occupational History  . Not on file  Social Needs  . Financial resource strain: Not on file  . Food insecurity:    Worry: Not on file    Inability: Not on file  . Transportation needs:    Medical: Not on file    Non-medical: Not on file  Tobacco Use  . Smoking status: Former Smoker    Last attempt to quit: 09/23/1958    Years since quitting: 59.7  . Smokeless tobacco: Former Systems developer  . Tobacco comment: quit smoking1980-quit chewing 6  months  Substance and Sexual Activity  . Alcohol use: No  . Drug use: No  . Sexual activity: Not on file  Lifestyle  . Physical activity:    Days per week: Not on file    Minutes per session: Not on file  . Stress: Not on file  Relationships  . Social connections:    Talks on phone: Not on file    Gets together: Not on file    Attends religious service: Not on file    Active member of club or organization: Not on file    Attends meetings of clubs or organizations: Not on file    Relationship status: Not on file  Other Topics Concern  .  Not on file  Social History Narrative  . Not on file     Family History: The patient's family history includes Alzheimer's disease in his mother; Diabetes in his sister; Heart attack in his father. ROS:   Please see the history of present illness.    All other systems reviewed and are negative.  EKGs/Labs/Other Studies Reviewed:    The following studies were reviewed today:  EKG:  EKG ordered today.  The ekg ordered today demonstrates A. fib heart rate relatively slow 65 bpm right bundle branch block  Recent Labs: Recent INR 3.1 cholesterol 181 HDL 46 LDL 94 creatinine normal 1.0 No results found for requested labs within last 8760 hours.  Recent Lipid Panel    Component Value Date/Time   CHOL 151 09/03/2012 0804   TRIG 126.0 09/03/2012 0804   HDL 40.10 09/03/2012 0804   CHOLHDL 4 09/03/2012 0804   VLDL 25.2 09/03/2012 0804   LDLCALC 86 09/03/2012 0804    Physical Exam:    VS:  BP (!) 100/58 (BP Location: Right Arm, Patient Position: Sitting, Cuff Size: Normal)   Pulse 65   Ht 6\' 2"  (1.88 m)   Wt 210 lb (95.3 kg)   SpO2 96%   BMI 26.96 kg/m     Wt Readings from Last 3 Encounters:  06/24/18 210 lb (95.3 kg)  08/05/16 208 lb (94.3 kg)  06/10/16 208 lb (94.3 kg)     GEN: He is a COPD appearance well nourished, well developed in no acute distress HEENT: Normal NECK: No JVD; No carotid bruits LYMPHATICS: No  lymphadenopathy CARDIAC: Irregular rhythm variable first heart sound distant heart sounds, no murmurs, rubs, gallops RESPIRATORY:  Clear to auscultation without rales, wheezing or rhonchi  ABDOMEN: Soft, non-tender, non-distended MUSCULOSKELETAL:  No edema; No deformity  SKIN: Warm and dry NEUROLOGIC:  Alert and oriented x 3 PSYCHIATRIC:  Normal affect    Signed, Shirlee More, MD  06/24/2018 1:55 PM    [facility]

## 2018-07-10 ENCOUNTER — Telehealth: Payer: Self-pay | Admitting: Cardiology

## 2018-07-10 MED ORDER — METOPROLOL SUCCINATE ER 25 MG PO TB24: 12.5000 mg | ORAL_TABLET | Freq: Every day | ORAL | 2 refills | Status: DC

## 2018-07-10 NOTE — Telephone Encounter (Signed)
Patient s Metoprolol did not get called in and needs called to the [facility] on [facility]

## 2018-10-02 ENCOUNTER — Ambulatory Visit (INDEPENDENT_AMBULATORY_CARE_PROVIDER_SITE_OTHER): Payer: Medicare PPO | Admitting: Sports Medicine

## 2018-10-02 ENCOUNTER — Encounter: Payer: Self-pay | Admitting: Sports Medicine

## 2018-10-02 VITALS — BP 98/52 | HR 69 | Resp 16

## 2018-10-02 DIAGNOSIS — I739 Peripheral vascular disease, unspecified: Secondary | ICD-10-CM

## 2018-10-02 DIAGNOSIS — Z7901 Long term (current) use of anticoagulants: Secondary | ICD-10-CM

## 2018-10-02 DIAGNOSIS — B351 Tinea unguium: Secondary | ICD-10-CM

## 2018-10-02 DIAGNOSIS — D689 Coagulation defect, unspecified: Secondary | ICD-10-CM | POA: Diagnosis not present

## 2018-10-02 DIAGNOSIS — M79676 Pain in unspecified toe(s): Secondary | ICD-10-CM

## 2018-10-02 NOTE — Progress Notes (Signed)
Subjective: Eric [name] is a 85 y.o. male patient seen today in office with complaint of painful thickened and elongated toenails; unable to trim. Patient denies any changes with medications or medical history since last visit size being considered diabetic again however does not monitor.  Reports that he has not has not been able to have his nails trimmed in a while because of being in the hospital.  Patient has no other pedal complaints at this time.   Patient Active Problem List   Diagnosis Date Noted  . Chronic anticoagulation 06/24/2018  . Type 2 diabetes mellitus with stage 3 chronic kidney disease, without long-term current use of insulin ([place]) 12/10/2017  . Abnormal liver enzymes 11/21/2017  . CKD (chronic kidney disease) stage 3, GFR 30-59 ml/min (HCC) 11/21/2017  . Anemia of unknown etiology 11/17/2017  . C. difficile colitis 11/17/2017  . Unexplained weight loss 11/17/2017  . Hemoptysis 11/17/2017  . SOB (shortness of breath) 12/10/2016  . Multiple pulmonary nodules 11/12/2016  . Chronic obstructive pulmonary disease ([place]) 07/01/2016  . Mixed hyperlipidemia 07/01/2016  . Status post total left knee replacement 09/14/2015  . Elevated PSA 12/07/2012  . Chest pain 07/28/2011  . HYPOTENSION, ORTHOSTATIC 05/15/2010  . Congestive heart failure ([place]) 05/11/2010  . CHEST PAIN UNSPECIFIED 03/20/2010  . Coronary artery disease involving native coronary artery of native heart without angina pectoris 10/23/2007  . Chronic atrial fibrillation 07/23/2007  . Hypertensive heart disease 07/21/2007  . GERD 07/21/2007  . CEREBROVASCULAR ACCIDENT, HX OF 07/21/2007  . Essential hypertension 07/21/2007    Current Outpatient Medications on File Prior to Visit  Medication Sig Dispense Refill  . acetaminophen (TYLENOL) 325 MG tablet Take 1 tablet by mouth every 6 (six) hours as needed.     Marland Kitchen albuterol (PROAIR HFA) 108 (90 BASE) MCG/ACT inhaler Inhale 2 puffs into the lungs every 6 (six)  hours as needed for wheezing or shortness of breath. 1 Inhaler 5  . atorvastatin (LIPITOR) 40 MG tablet TAKE 1 TABLET AT BEDTIME 90 tablet 1  . benzonatate (TESSALON) 100 MG capsule Take 1 capsule (100 mg total) by mouth 2 (two) times daily as needed for cough. 20 capsule 0  . Bimatoprost (LUMIGAN) 0.01 % SOLN Place 1 drop into both eyes at bedtime. Both eyes at hour of sleep    . bisacodyl (DULCOLAX) 5 MG EC tablet Take 1 tablet by mouth as needed.    . Calcium Carb-Cholecalciferol (CALCIUM-VITAMIN D) 500-200 MG-UNIT tablet Take 1 tablet by mouth 2 (two) times daily.    . carvedilol (COREG) 3.125 MG tablet Take by mouth.    . clorazepate (TRANXENE) 3.75 MG tablet Take 1 tablet (3.75 mg total) by mouth 3 (three) times daily as needed for anxiety. 90 tablet 0  . cycloSPORINE (RESTASIS) 0.05 % ophthalmic emulsion Place 1 drop into both eyes 2 (two) times daily.     . Difluprednate (DUREZOL) 0.05 % EMUL Place 1 drop into both eyes daily.     Marland Kitchen docusate sodium (COLACE) 100 MG capsule Take 100 mg by mouth 2 (two) times daily.     Marland Kitchen escitalopram (LEXAPRO) 5 MG tablet Take 1 tablet by mouth daily.  5  . ferrous sulfate 325 (65 FE) MG tablet Take 1 tablet by mouth daily.    . fexofenadine (ALLEGRA) 180 MG tablet Take 180 mg by mouth daily.     . furosemide (LASIX) 20 MG tablet TAKE 2 TABLETS EVERY DAY 180 tablet 1  . LACTOBACILLUS PO Take 1  capsule by mouth daily.    . metFORMIN (GLUCOPHAGE-XR) 500 MG 24 hr tablet TAKE 1 TABLET(500 MG) BY MOUTH DAILY WITH BREAKFAST 30 tablet 3  . metoprolol succinate (TOPROL-XL) 25 MG 24 hr tablet Take 0.5 tablets (12.5 mg total) by mouth daily. 45 tablet 2  . metoprolol tartrate (LOPRESSOR) 50 MG tablet Take by mouth.    . multivitamin (THERAGRAN) per tablet Take 1 tablet by mouth daily.     . mupirocin ointment (BACTROBAN) 2 % Apply topically.    . nitroGLYCERIN (NITROSTAT) 0.4 MG SL tablet Place 0.4 mg under the tongue every 5 (five) minutes as needed for chest pain.     Marland Kitchen omeprazole (PRILOSEC) 20 MG capsule Take by mouth.    . pantoprazole (PROTONIX) 40 MG tablet TAKE 1 TABLET BY MOUTH EVERY DAY 90 tablet 0  . sucralfate (CARAFATE) 1 g tablet Take by mouth.    . tamsulosin (FLOMAX) 0.4 MG CAPS capsule Take by mouth.    . vitamin C (ASCORBIC ACID) 500 MG tablet Take 1,000 mg by mouth daily.     Marland Kitchen warfarin (COUMADIN) 5 MG tablet TAKE AS DIRECTED BY ANTICOAGULATION CLINIC (Patient taking differently: Currently taking 2.5mg  everyday except 5 mg on M, W, and Fri) 90 tablet 2   No current facility-administered medications on file prior to visit.     Allergies  Allergen Reactions  . Enoxaparin Sodium [Enoxaparin Sodium] Anaphylaxis and Other (See Comments)    angioedema  . Codeine Other (See Comments)    hallucinations hallucinations  . Oxycodone Hcl Other (See Comments)    hallucinations  . Sulfa Antibiotics Other (See Comments)    Unknown.  . Sulfamethoxazole Other (See Comments)    Unknown.  . Vicodin [Hydrocodone-Acetaminophen] Other (See Comments)    hallucinations  . Sulfonamide Derivatives Rash  . Tramadol Rash    Objective: Physical Exam  General: Well developed, nourished, no acute distress, awake, alert and oriented x 3  Vascular: Dorsalis pedis artery 1/4 bilateral, Posterior tibial artery 1/4 bilateral, skin temperature warm to warm proximal to distal bilateral lower extremities, + varicosities, scant pedal hair present bilateral.  Neurological: Gross sensation present via light touch bilateral.   Dermatological: Skin is warm, dry, and supple bilateral, Nails 1-10 are tender, long, thick, and discolored with mild subungal debris, no webspace macerations present bilateral, no open lesions present bilateral, no callus/corns/hyperkeratotic tissue present bilateral. No signs of infection bilateral.  Musculoskeletal: No symptomatic boney deformities noted bilateral. Muscular strength within normal limits without painon range of motion. No  pain with calf compression bilateral.  Assessment and Plan:  Problem List Items Addressed This Visit    None    Visit Diagnoses    Pain due to onychomycosis of toenail    -  Primary   Relevant Medications   mupirocin ointment (BACTROBAN) 2 %   Coagulation defect (HCC)       PVD (peripheral vascular disease) (HCC)       Relevant Medications   carvedilol (COREG) 3.125 MG tablet   metoprolol tartrate (LOPRESSOR) 50 MG tablet   Anticoagulant long-term use          -Examined patient.  -Discussed treatment options for painful mycotic nails. -Mechanically debrided and reduced mycotic nails with sterile nail nipper and dremel nail file without incident. -Patient to return in 3 months for follow up evaluation or sooner if symptoms worsen.  Landis Martins, DPM

## 2018-11-10 ENCOUNTER — Telehealth: Payer: Self-pay | Admitting: Cardiology

## 2018-11-10 NOTE — Telephone Encounter (Signed)
°*  STAT* If patient is at the pharmacy, call can be transferred to refill team.   1. Which medications need to be refilled? (please list name of each medication and dose if known) Metoprolol 25mg  takes twice daily  2. Which pharmacy/location (including street and city if local pharmacy) is medication to be sent to?[facility]  3. Do they need a 30 day or 90 day supply? 90  Pt just wants 25mg  not 50mg .. hard for him to cut.

## 2018-11-10 NOTE — Telephone Encounter (Signed)
Left message for patient to return call to verify medication name, dosage, and frequency before refill will be provided.

## 2018-11-11 MED ORDER — METOPROLOL SUCCINATE ER 25 MG PO TB24: 25.0000 mg | ORAL_TABLET | Freq: Every day | ORAL | 0 refills | Status: DC

## 2018-11-11 NOTE — Telephone Encounter (Signed)
When patient was seen on 06/24/2018, his metoprolol succinate dose was decreased to 12.5 mg daily.   Refill for metoprolol was requested. Called patient to verify the correct medication, dosage, and frequency since metoprolol tartrate and metoprolol succinate are both listed on his medication list. Patient is currently taking metoprolol tartrate 25 mg twice daily. Dr. Bettina Gavia recommends patient take metoprolol succinate 25 mg daily. Patient verbalized understanding.    Refill sent to [facility] as requested. No further questions.

## 2018-11-11 NOTE — Telephone Encounter (Signed)
Pt called back and said he wants the Metoprolol 25 mg that he takes bid. Has been getting the 50 mg but he has to cut those in half so would like to switch to the 25 and he does take that twice daily. Please send in, thanks

## 2018-12-17 ENCOUNTER — Telehealth: Payer: Self-pay | Admitting: *Deleted

## 2018-12-17 NOTE — Telephone Encounter (Signed)
   Cardiac Questionnaire:      1. Have you been having new or worsening chest pain? No   2. Have you been having new or worsening shortness of breath? No 3. Have you been having new or worsening leg swelling, wt gain, or increase in abdominal girth (pants fitting more tightly)? No   4. Have you had any passing out spells? No  Per Dr. Bettina Gavia, patient informed that his appointment should be rescheduled for June 2020 due to COVID-19. Patient is agreeable and appointment has been rescheduled. Patient advised to contact our office with any questions or concerns.

## 2018-12-21 ENCOUNTER — Ambulatory Visit: Payer: Medicare PPO | Admitting: Cardiology

## 2018-12-23 ENCOUNTER — Telehealth: Payer: Self-pay | Admitting: *Deleted

## 2018-12-23 MED ORDER — ISOSORBIDE MONONITRATE ER 30 MG PO TB24: 30.0000 mg | ORAL_TABLET | Freq: Every day | ORAL | 3 refills | Status: DC

## 2018-12-23 NOTE — Telephone Encounter (Signed)
This is a difficult situation he has a history of normal coronary arteriography years ago and has had atypical chest pain felt to be due to costo chondral pain syndrome.  I really do not want to bring him to the office or to the hospital right now, let us place him on Imdur 30 mg daily at the symptoms persist he may require a quick emergency room visit check a troponin and EKG and those test were normal I think he could be discharged home.  I did see him socially in the last few weeks and he relayed to me at that time that he had fallen please ask him if he had struck his chest at that time.  Let us put him in for a virtual visit next week

## 2018-12-23 NOTE — Addendum Note (Signed)
Addended by: Austin Miles on: 12/23/2018 02:59 PM   Modules accepted: Orders

## 2018-12-23 NOTE — Telephone Encounter (Signed)
Patient is agreeable to Dr. Joya Gaskins plan below and will start taking imdur 30 mg today. Prescription has been sent to [facility] in [place] as requested. Patient will contact our office if his pain does not improve before his scheduled televist on Wednesday, 12/30/2018, at 10:00 am.   Patient reports that he does not think he hit his chest during his fall a few weeks ago but cannot be certain. Will make Dr. Bettina Gavia aware.   Patient verbalized understanding and is agreeable to plan. No further questions.    YOUR CARDIOLOGY TEAM HAS ARRANGED FOR AN E-VISIT FOR YOUR APPOINTMENT - PLEASE REVIEW IMPORTANT INFORMATION BELOW SEVERAL DAYS PRIOR TO YOUR APPOINTMENT  Due to the recent COVID-19 pandemic, we are transitioning in-person office visits to tele-medicine visits in an effort to decrease unnecessary exposure to our patients and staff. Medicare and most insurances are covering these visits without a copay needed. We also encourage you to sign up for MyChart if you have not already done so. You will need a smartphone if possible. For patients that do not have this, we can still complete the visit using a regular telephone but do prefer a smartphone to enable video when possible. You may have a close family member that lives with you that can help. If possible, we also ask that you have a blood pressure cuff and scale at home to measure your blood pressure, heart rate and weight prior to your scheduled appointment. Patients with clinical needs that need an in-person evaluation and testing will still be able to come to the office if absolutely necessary. If you have any questions, feel free to call our office.    IF YOU HAVE A SMARTPHONE, PLEASE DOWNLOAD THE WEBEX APP TO YOUR SMARTPHONE  - If Apple, go to [employer] and type in WebEx in the search bar. [place] [employer], the blue/green circle. The app is free but as with any other app download, your phone may require you to verify saved payment  information or Apple password. You do NOT have to create a WebEx account.  - If Android, go to [employer] and type in [employer] in the search bar. [place] [employer], the blue/green circle. The app is free but as with any other app download, your phone may require you to verify saved payment information or Android password. You do NOT have to create a WebEx account.  It is very helpful to have this downloaded before your visit.    2-3 DAYS BEFORE YOUR APPOINTMENT  You will receive a telephone call from one of our [facility] team members - your caller ID may say "Unknown caller." If this is a video visit, we will confirm that you have been able to download the WebEx app. We will remind you check your blood pressure, heart rate and weight prior to your scheduled appointment. If you have an Apple Watch or Kardia, please upload any pertinent ECG strips the day before or morning of your appointment to [place]. Our staff will also make sure you have reviewed the consent and agree to move forward with your scheduled tele-health visit.     THE DAY OF YOUR APPOINTMENT  Approximately 15 minutes prior to your scheduled appointment, you will receive a telephone call from one of [facility] team - your caller ID may say "Unknown caller."  Our staff will confirm medications, vital signs for the day and any symptoms you may be experiencing. Please have this information available prior to the time  of visit start. It may also be helpful for you to have a pad of paper and pen handy for any instructions given during your visit. They will also walk you through joining the WebEx smartphone meeting if this is a video visit.    CONSENT FOR TELE-HEALTH VISIT - PLEASE REVIEW  I hereby voluntarily request, consent and authorize [facility] and its employed or contracted physicians, physician assistants, nurse practitioners or other licensed health care professionals (the Practitioner), to provide me with  telemedicine health care services (the "Services") as deemed necessary by the treating Practitioner. I acknowledge and consent to receive the Services by the Practitioner via telemedicine. I understand that the telemedicine visit will involve communicating with the Practitioner through live audiovisual communication technology and the disclosure of certain medical information by electronic transmission. I acknowledge that I have been given the opportunity to request an in-person assessment or other available alternative prior to the telemedicine visit and am voluntarily participating in the telemedicine visit.  I understand that I have the right to withhold or withdraw my consent to the use of telemedicine in the course of my care at any time, without affecting my right to future care or treatment, and that the Practitioner or I may terminate the telemedicine visit at any time. I understand that I have the right to inspect all information obtained and/or recorded in the course of the telemedicine visit and may receive copies of available information for a reasonable fee.  I understand that some of the potential risks of receiving the Services via telemedicine include:  Marland Kitchen Delay or interruption in medical evaluation due to technological equipment failure or disruption; . Information transmitted may not be sufficient (e.g. poor resolution of images) to allow for appropriate medical decision making by the Practitioner; and/or  . In rare instances, security protocols could fail, causing a breach of personal health information.  Furthermore, I acknowledge that it is my responsibility to provide information about my medical history, conditions and care that is complete and accurate to the best of my ability. I acknowledge that Practitioner's advice, recommendations, and/or decision may be based on factors not within their control, such as incomplete or inaccurate data provided by me or distortions of diagnostic  images or specimens that may result from electronic transmissions. I understand that the practice of medicine is not an exact science and that Practitioner makes no warranties or guarantees regarding treatment outcomes. I acknowledge that I will receive a copy of this consent concurrently upon execution via email to the email address I last provided but may also request a printed copy by calling the office of [facility].    I understand that my insurance will be billed for this visit.   I have read or had this consent read to me. . I understand the contents of this consent, which adequately explains the benefits and risks of the Services being provided via telemedicine.  . I have been provided ample opportunity to ask questions regarding this consent and the Services and have had my questions answered to my satisfaction. . I give my informed consent for the services to be provided through the use of telemedicine in my medical care  By participating in this telemedicine visit I agree to the above. Patient verbally consented to televisit. CPL, RN      Cardiac Questionnaire:    Since your last visit or hospitalization:    1. Have you been having new or worsening chest pain? Yes   2.  Have you been having new or worsening shortness of breath? No 3. Have you been having new or worsening leg swelling, wt gain, or increase in abdominal girth (pants fitting more tightly)? No   4. Have you had any passing out spells? No    Patient has been scheduled for a televisit with Dr. Bettina Gavia next Wednesday, 12/30/2018, at 10:00 am for further evaluation of symptoms.     _____________   [id number] Pre-Screening Questions:  . Do you currently have a fever? No . Have you recently travelled on a cruise, internationally, or to [place], [place], [place], [place], [place], or [place], [place] [employer]) ? No . Have you been in contact with someone that is currently pending confirmation of Covid19 testing or has been confirmed to have  the [place] virus?  No Are you currently experiencing fatigue or cough? No

## 2018-12-23 NOTE — Telephone Encounter (Signed)
Patient called with complaints of pain in middle of his abdomen. Patient reports "it hurts between my chest and my stomach."  This pain started yesterday. Patient states he took a nitroglycerin tablet yesterday and it resolved. Patient states the same thing happened today with resolution after taking nitroglycerin. Patient is sitting down in his chair when pain onsets. Patient denies any pain at this time and reports that he takes all his medications as prescribed. His BP now is 117/70 and HR is 58.  Patient was seen by Dr. Willette Pa office yesterday due to right side pain. Dr. Willette Pa office thinks this is related to gas. Patient was advised to contact our office for further evaluation of pain as described above.   Will have Dr. Bettina Gavia advise.

## 2018-12-24 ENCOUNTER — Other Ambulatory Visit: Payer: Self-pay

## 2018-12-24 ENCOUNTER — Emergency Department (HOSPITAL_BASED_OUTPATIENT_CLINIC_OR_DEPARTMENT_OTHER)
Admission: EM | Admit: 2018-12-24 | Discharge: 2018-12-24 | Disposition: A | Discharge status: Home / Self Care | Payer: Medicare PPO | Attending: Emergency Medicine | Admitting: Emergency Medicine

## 2018-12-24 ENCOUNTER — Encounter (HOSPITAL_BASED_OUTPATIENT_CLINIC_OR_DEPARTMENT_OTHER): Payer: Self-pay | Admitting: *Deleted

## 2018-12-24 ENCOUNTER — Emergency Department (HOSPITAL_BASED_OUTPATIENT_CLINIC_OR_DEPARTMENT_OTHER): Payer: Medicare PPO

## 2018-12-24 DIAGNOSIS — Z79899 Other long term (current) drug therapy: Secondary | ICD-10-CM | POA: Diagnosis not present

## 2018-12-24 DIAGNOSIS — Z87891 Personal history of nicotine dependence: Secondary | ICD-10-CM | POA: Insufficient documentation

## 2018-12-24 DIAGNOSIS — N183 Chronic kidney disease, stage 3 (moderate): Secondary | ICD-10-CM | POA: Insufficient documentation

## 2018-12-24 DIAGNOSIS — I2510 Atherosclerotic heart disease of native coronary artery without angina pectoris: Secondary | ICD-10-CM | POA: Diagnosis not present

## 2018-12-24 DIAGNOSIS — R634 Abnormal weight loss: Secondary | ICD-10-CM | POA: Insufficient documentation

## 2018-12-24 DIAGNOSIS — E1122 Type 2 diabetes mellitus with diabetic chronic kidney disease: Secondary | ICD-10-CM | POA: Insufficient documentation

## 2018-12-24 DIAGNOSIS — I130 Hypertensive heart and chronic kidney disease with heart failure and stage 1 through stage 4 chronic kidney disease, or unspecified chronic kidney disease: Secondary | ICD-10-CM | POA: Insufficient documentation

## 2018-12-24 DIAGNOSIS — Z96652 Presence of left artificial knee joint: Secondary | ICD-10-CM | POA: Diagnosis not present

## 2018-12-24 DIAGNOSIS — D696 Thrombocytopenia, unspecified: Secondary | ICD-10-CM | POA: Diagnosis not present

## 2018-12-24 DIAGNOSIS — R109 Unspecified abdominal pain: Secondary | ICD-10-CM

## 2018-12-24 DIAGNOSIS — R0789 Other chest pain: Secondary | ICD-10-CM | POA: Diagnosis not present

## 2018-12-24 DIAGNOSIS — I509 Heart failure, unspecified: Secondary | ICD-10-CM | POA: Diagnosis not present

## 2018-12-24 DIAGNOSIS — R1013 Epigastric pain: Secondary | ICD-10-CM | POA: Diagnosis present

## 2018-12-24 DIAGNOSIS — R748 Abnormal levels of other serum enzymes: Secondary | ICD-10-CM | POA: Diagnosis not present

## 2018-12-24 DIAGNOSIS — Z7984 Long term (current) use of oral hypoglycemic drugs: Secondary | ICD-10-CM | POA: Diagnosis not present

## 2018-12-24 DIAGNOSIS — Z7901 Long term (current) use of anticoagulants: Secondary | ICD-10-CM | POA: Insufficient documentation

## 2018-12-24 LAB — URINALYSIS, ROUTINE W REFLEX MICROSCOPIC
Bilirubin Urine: NEGATIVE
Glucose, UA: NEGATIVE mg/dL
Hgb urine dipstick: NEGATIVE
Ketones, ur: NEGATIVE mg/dL
Leukocytes,Ua: NEGATIVE
Nitrite: NEGATIVE
Protein, ur: NEGATIVE mg/dL
Specific Gravity, Urine: <1.005 — ABNORMAL LOW (ref 1.005–1.030)
pH: 6 (ref 5.0–8.0)

## 2018-12-24 LAB — CBC WITH DIFFERENTIAL/PLATELET
Abs Immature Granulocytes: 0.02 10*3/uL (ref 0.00–0.07)
Basophils Absolute: 0 10*3/uL (ref 0.0–0.1)
Basophils Relative: 0 %
Eosinophils Absolute: 0.1 10*3/uL (ref 0.0–0.5)
Eosinophils Relative: 2 %
HCT: 36.1 % — ABNORMAL LOW (ref 39.0–52.0)
Hemoglobin: 11.6 g/dL — ABNORMAL LOW (ref 13.0–17.0)
Immature Granulocytes: 0 %
Lymphocytes Relative: 13 %
Lymphs Abs: 0.9 10*3/uL (ref 0.7–4.0)
MCH: 30.1 pg (ref 26.0–34.0)
MCHC: 32.1 g/dL (ref 30.0–36.0)
MCV: 93.5 fL (ref 80.0–100.0)
Monocytes Absolute: 0.8 10*3/uL (ref 0.1–1.0)
Monocytes Relative: 11 %
Neutro Abs: 5.4 10*3/uL (ref 1.7–7.7)
Neutrophils Relative %: 74 %
Platelets: 94 10*3/uL — ABNORMAL LOW (ref 150–400)
RBC: 3.86 MIL/uL — ABNORMAL LOW (ref 4.22–5.81)
RDW: 13.7 % (ref 11.5–15.5)
WBC: 7.3 10*3/uL (ref 4.0–10.5)
nRBC: 0 % (ref 0.0–0.2)

## 2018-12-24 LAB — COMPREHENSIVE METABOLIC PANEL
ALT: 27 U/L (ref 0–44)
AST: 38 U/L (ref 15–41)
Albumin: 4 g/dL (ref 3.5–5.0)
Alkaline Phosphatase: 80 U/L (ref 38–126)
Anion gap: 8 (ref 5–15)
BUN: 15 mg/dL (ref 8–23)
CO2: 28 mmol/L (ref 22–32)
Calcium: 8.8 mg/dL — ABNORMAL LOW (ref 8.9–10.3)
Chloride: 98 mmol/L (ref 98–111)
Creatinine, Ser: 0.85 mg/dL (ref 0.61–1.24)
GFR calc Af Amer: >60 mL/min (ref >60)
GFR calc non Af Amer: >60 mL/min (ref >60)
Glucose, Bld: 112 mg/dL — ABNORMAL HIGH (ref 70–99)
Potassium: 4.2 mmol/L (ref 3.5–5.1)
Sodium: 134 mmol/L — ABNORMAL LOW (ref 135–145)
Total Bilirubin: 0.7 mg/dL (ref 0.3–1.2)
Total Protein: 6.4 g/dL — ABNORMAL LOW (ref 6.5–8.1)

## 2018-12-24 LAB — TROPONIN I: Troponin I: <0.03 ng/mL (ref <0.03)

## 2018-12-24 LAB — LIPASE, BLOOD: Lipase: 85 U/L — ABNORMAL HIGH (ref 11–51)

## 2018-12-24 MED ORDER — ACETAMINOPHEN 500 MG PO TABS
1000.0000 mg | ORAL_TABLET | Freq: Once | ORAL | Status: AC
  Administered 2018-12-24: 1000 mg via ORAL
  Filled 2018-12-24: qty 2

## 2018-12-24 NOTE — ED Notes (Signed)
pts son Mitchell Iwanicki can be contacted at [phone]. He is waiting in the car.

## 2018-12-24 NOTE — ED Notes (Signed)
Pt. Reports at times he has lower extremity swelling but not today

## 2018-12-24 NOTE — ED Notes (Signed)
Pt. Reports he has been hurting in the R flank area for a week and then the pain started to go around his back around to the shoulders and neck and now everywhere. Pt. Has no cough no shortness of breath reported by Pt.

## 2018-12-24 NOTE — Discharge Instructions (Signed)
Follow-up closely with your primary doctor/cardiologist for appointment and return for worsening or new symptoms.  Take Tylenol and your nitro as needed for pain.  You will need follow-up for your elevated pancreas enzyme and low platelets.

## 2018-12-24 NOTE — Telephone Encounter (Signed)
Patient called with concern of reoccurring intermittent chest pain today. He has had three episodes of 10 out of 10 chest pain. He took a nitroglycerin this morning during the first episode at 10:00 am and the pain resolved but eventually came back. Patient also complains of pain in his back and both sides. He knows he fell on his right side a couple of weeks ago but states the pain just started on Friday, 12/18/2018. Patient's BP is 118/69 and HR is 61. He confirms that he started taking imdur as prescribed yesterday.   Spoke with Dr. Bettina Gavia who advised patient needs to go to the emergency department at [facility] as soon as possible for further evaluation. Patient is agreeable and verbalized understanding. Address and directions provided. Patient is calling his son to take him. No further questions.

## 2018-12-24 NOTE — ED Provider Notes (Signed)
[facility] EMERGENCY DEPARTMENT Provider Note   CSN: [id number] Arrival date & time: 12/24/18  1612    History   Chief Complaint Chief Complaint  Patient presents with   Abdominal Pain   Cough    HPI Eric [name] is a 85 y.o. male.     Patient presents with multiple different concerns.  Patient's had intermittent epigastric/lower chest pain no specific associations for the past 3 days.  No exertional component or vomiting.  No radiation.  Patient is also had intermittent right flank pain for the past 6 days without association.  Patient had history of gallbladder removal however still has his appendix.  No fevers or chills.  Mild chronic cough for years.  Patient has had mild weight loss no history of cancer known.  Patient's had mild paraspinal back pain lower no direct trauma or injury.  Patient denies neurologic symptoms.  No urinary symptoms.  Patient is followed by local cardiology group and recommended for screening cardiac work-up in the ER today.     Past Medical History:  Diagnosis Date   Atrial fibrillation ([facility] [place])    Blood transfusion without reported diagnosis    CAD (coronary artery disease)    CHF (congestive heart failure) (HCC)    GERD (gastroesophageal reflux disease)    GI bleed    Hyperlipidemia    Hypertension    NSAID-induced gastric ulcer    Trigeminal neuralgia     Patient Active Problem List   Diagnosis Date Noted   Chronic anticoagulation 06/24/2018   Type 2 diabetes mellitus with stage 3 chronic kidney disease, without long-term current use of insulin ([place]) 12/10/2017   Abnormal liver enzymes 11/21/2017   CKD (chronic kidney disease) stage 3, GFR 30-59 ml/min (HCC) 11/21/2017   Anemia of unknown etiology 11/17/2017   C. difficile colitis 11/17/2017   Unexplained weight loss 11/17/2017   Hemoptysis 11/17/2017   SOB (shortness of breath) 12/10/2016   Multiple pulmonary nodules 11/12/2016   Chronic  obstructive pulmonary disease ([place]) 07/01/2016   Mixed hyperlipidemia 07/01/2016   Status post total left knee replacement 09/14/2015   Elevated PSA 12/07/2012   Chest pain 07/28/2011   HYPOTENSION, ORTHOSTATIC 05/15/2010   Congestive heart failure ([place]) 05/11/2010   CHEST PAIN UNSPECIFIED 03/20/2010   Coronary artery disease involving native coronary artery of native heart without angina pectoris 10/23/2007   Chronic atrial fibrillation 07/23/2007   Hypertensive heart disease 07/21/2007   GERD 07/21/2007   CEREBROVASCULAR ACCIDENT, HX OF 07/21/2007   Essential hypertension 07/21/2007    Past Surgical History:  Procedure Laterality Date   CHOLECYSTECTOMY     ESOPHAGOGASTRODUODENOSCOPY  2011   FLEXIBLE SIGMOIDOSCOPY N/A 06/04/2013   Procedure: FLEXIBLE SIGMOIDOSCOPY;  Surgeon: Winfield Cunas., MD;  Location: [facility] ENDOSCOPY;  Service: Endoscopy;  Laterality: N/A;   HIP SURGERY  August 2008   Right total hip replacement   SHOULDER SURGERY     Left   TOTAL KNEE ARTHROPLASTY  June 2009   Left        Home Medications    Prior to Admission medications   Medication Sig Start Date End Date Taking? Authorizing Provider  acetaminophen (TYLENOL) 325 MG tablet Take 1 tablet by mouth every 6 (six) hours as needed.     [provider]  albuterol (PROAIR HFA) 108 (90 BASE) MCG/ACT inhaler Inhale 2 puffs into the lungs every 6 (six) hours as needed for wheezing or shortness of breath. 11/05/13   Marletta Lor, MD  atorvastatin (LIPITOR) 40 MG tablet TAKE 1 TABLET AT BEDTIME 08/11/14   Marletta Lor, MD  benzonatate (TESSALON) 100 MG capsule Take 1 capsule (100 mg total) by mouth 2 (two) times daily as needed for cough. 03/29/16   Nafziger, Tommi Rumps, NP  Bimatoprost (LUMIGAN) 0.01 % SOLN Place 1 drop into both eyes at bedtime. Both eyes at hour of sleep    [provider]  bisacodyl (DULCOLAX) 5 MG EC tablet Take 1 tablet by mouth as needed.     [provider]  Calcium Carb-Cholecalciferol (CALCIUM-VITAMIN D) 500-200 MG-UNIT tablet Take 1 tablet by mouth 2 (two) times daily.    [provider]  carvedilol (COREG) 3.125 MG tablet Take by mouth. 08/21/18 11/19/18  [provider]  clorazepate (TRANXENE) 3.75 MG tablet Take 1 tablet (3.75 mg total) by mouth 3 (three) times daily as needed for anxiety. 07/19/13   Marletta Lor, MD  cycloSPORINE (RESTASIS) 0.05 % ophthalmic emulsion Place 1 drop into both eyes 2 (two) times daily.     [provider]  Difluprednate (DUREZOL) 0.05 % EMUL Place 1 drop into both eyes daily.     [provider]  docusate sodium (COLACE) 100 MG capsule Take 100 mg by mouth 2 (two) times daily.     [provider]  escitalopram (LEXAPRO) 5 MG tablet Take 1 tablet by mouth daily. 05/27/18   [provider]  ferrous sulfate 325 (65 FE) MG tablet Take 1 tablet by mouth daily.    [provider]  fexofenadine (ALLEGRA) 180 MG tablet Take 180 mg by mouth daily.     [provider]  furosemide (LASIX) 20 MG tablet TAKE 2 TABLETS EVERY DAY 04/17/17   Marletta Lor, MD  isosorbide mononitrate (IMDUR) 30 MG 24 hr tablet Take 1 tablet (30 mg total) by mouth daily. 12/23/18 03/23/19  Richardo Priest, MD  LACTOBACILLUS PO Take 1 capsule by mouth daily.    [provider]  metFORMIN (GLUCOPHAGE-XR) 500 MG 24 hr tablet TAKE 1 TABLET(500 MG) BY MOUTH DAILY WITH BREAKFAST 03/21/16   Laurey Morale, MD  metoprolol succinate (TOPROL-XL) 25 MG 24 hr tablet Take 1 tablet (25 mg total) by mouth daily. 11/11/18   Richardo Priest, MD  multivitamin [facility]) per tablet Take 1 tablet by mouth daily.     [provider]  mupirocin ointment (BACTROBAN) 2 % Apply topically. 07/02/18   [provider]  nitroGLYCERIN (NITROSTAT) 0.4 MG SL tablet Place 0.4 mg under the tongue every 5 (five) minutes as needed for chest pain.     [provider]  omeprazole (PRILOSEC) 20 MG capsule Take by mouth. 09/28/18   [provider]  pantoprazole (PROTONIX) 40 MG tablet TAKE 1 TABLET BY MOUTH EVERY DAY 10/24/14   Marletta Lor, MD  sucralfate (CARAFATE) 1 g tablet Take by mouth.    [provider]  tamsulosin (FLOMAX) 0.4 MG CAPS capsule Take by mouth. 09/18/18   [provider]  vitamin C (ASCORBIC ACID) 500 MG tablet Take 1,000 mg by mouth daily.     [provider]  warfarin (COUMADIN) 5 MG tablet TAKE AS DIRECTED BY ANTICOAGULATION CLINIC Patient taking differently: Currently taking 2.5mg  everyday except 5 mg on M, W, and Fri 12/16/16   Marletta Lor, MD    Family History Family History  Problem Relation Age of Onset   Heart attack Father    Alzheimer's disease Mother    Diabetes  Sister     Social History Social History   Tobacco Use   Smoking status: Former Smoker    Last attempt to quit: 09/23/1958    Years since quitting: 60.2   Smokeless tobacco: Former Systems developer   Tobacco comment: quit smoking1980-quit chewing 6 months  Substance Use Topics   Alcohol use: No   Drug use: No     Allergies   Enoxaparin sodium [enoxaparin sodium]; Codeine; Oxycodone hcl; Sulfa antibiotics; Sulfamethoxazole; Vicodin [hydrocodone-acetaminophen]; Sulfonamide derivatives; and Tramadol   Review of Systems Review of Systems  Constitutional: Negative for chills and fever.  HENT: Negative for congestion.   Eyes: Negative for visual disturbance.  Respiratory: Positive for cough. Negative for shortness of breath.   Cardiovascular: Positive for chest pain. Negative for leg swelling.  Gastrointestinal: Negative for abdominal pain and vomiting.  Genitourinary: Positive for flank pain. Negative for dysuria.  Musculoskeletal: Positive for back pain. Negative for neck pain and neck stiffness.  Skin: Negative for rash.  Neurological: Negative for light-headedness and headaches.       Physical Exam Updated Vital Signs BP 118/62    Pulse 81    Temp 98.3 F (36.8 C) (Oral)    Resp 17    Ht 6\' 2"  (1.88 m)    Wt 90.3 kg    SpO2 99%    BMI 25.55 kg/m   Physical Exam Vitals signs and nursing note reviewed.  Constitutional:      Appearance: He is well-developed.  HENT:     Head: Normocephalic and atraumatic.  Eyes:     General:        Right eye: No discharge.        Left eye: No discharge.     Conjunctiva/sclera: Conjunctivae normal.  Neck:     Musculoskeletal: Normal range of motion and neck supple.     Trachea: No tracheal deviation.  Cardiovascular:     Rate and Rhythm: Normal rate and regular rhythm.  Pulmonary:     Effort: Pulmonary effort is normal.     Breath sounds: Normal breath sounds.  Abdominal:     General: There is no distension.     Palpations: Abdomen is soft.     Tenderness: There is abdominal tenderness (mild right mid flank/ abdomen). There is no guarding.  Musculoskeletal:     Comments: Nl strength/. Sensation lower extremities.  Mild paraspinal lumbar tenderness, no midline tenderness  Skin:    General: Skin is warm.     Findings: No rash.  Neurological:     General: No focal deficit present.     Mental Status: He is alert and oriented to person, place, and time.      ED Treatments / Results  Labs (all labs ordered are listed, but only abnormal results are displayed) Labs Reviewed  COMPREHENSIVE METABOLIC PANEL - Abnormal; Notable for the following components:      Result Value   Sodium 134 (*)    Glucose, Bld 112 (*)    Calcium 8.8 (*)    Total Protein 6.4 (*)    All other components within normal limits  LIPASE, BLOOD - Abnormal; Notable for the following components:   Lipase 85 (*)    All other components within normal limits  URINALYSIS, ROUTINE W REFLEX MICROSCOPIC - Abnormal; Notable for the following components:   Specific Gravity, Urine <1.005 (*)    All other components within normal limits  CBC WITH  DIFFERENTIAL/PLATELET - Abnormal; Notable for the following components:   RBC  3.86 (*)    Hemoglobin 11.6 (*)    HCT 36.1 (*)    Platelets 94 (*)    All other components within normal limits  TROPONIN I  CBC WITH DIFFERENTIAL/PLATELET    EKG EKG Interpretation  Date/Time:  Thursday December 24 2018 16:20:42 EDT Ventricular Rate:  68 PR Interval:    QRS Duration: 104 QT Interval:  406 QTC Calculation: 431 R Axis:   67 Text Interpretation:  Atrial fibrillation Incomplete right bundle branch block Abnormal ECG Confirmed by Elnora Morrison [phone]) on 12/24/2018 4:22:59 PM   Radiology Dg Chest Portable 1 View  Result Date: 12/24/2018 CLINICAL DATA:  Cough. EXAM: PORTABLE CHEST 1 VIEW COMPARISON:  Radiographs of May 20, 2018. FINDINGS: The heart size and mediastinal contours are within normal limits. Both lungs are clear. No pneumothorax or pleural effusion is noted. The visualized skeletal structures are unremarkable. IMPRESSION: No active disease. Electronically Signed   By: Marijo Conception, M.D.   On: 12/24/2018 17:12   Ct Renal Stone Study  Result Date: 12/24/2018 CLINICAL DATA:  Intermittent right flank pain for the past week. EXAM: CT ABDOMEN AND PELVIS WITHOUT CONTRAST TECHNIQUE: Multidetector CT imaging of the abdomen and pelvis was performed following the standard protocol without IV contrast. COMPARISON:  CT abdomen pelvis dated November 29, 2017. FINDINGS: Lower chest: No acute abnormality. Hepatobiliary: Stable small hepatic cysts. No new focal liver abnormality. Prior cholecystectomy. No biliary dilatation. Pancreas: Mild atrophy. No ductal dilatation or surrounding inflammatory changes. Spleen: Normal in size without focal abnormality. Adrenals/Urinary Tract: Adrenal glands are unremarkable. Unchanged bilateral renal cysts. No renal or ureteral calculi. No hydronephrosis. Mild circumferential bladder wall thickening. Stomach/Bowel: The stomach is within normal limits. Unchanged duodenal  diverticulum. No bowel wall thickening, distention, or surrounding inflammatory changes. Mild sigmoid diverticulosis. Mild stool burden throughout the colon. Normal appendix. Vascular/Lymphatic: Aortic atherosclerosis. No enlarged abdominal or pelvic lymph nodes. Reproductive: Unchanged prostatomegaly. Other: Unchanged small fat containing bilateral inguinal hernias. No free fluid or pneumoperitoneum. Musculoskeletal: No acute or significant osseous findings. Chronic L2 compression deformity. Old healed prior bilateral total hip arthroplasties. IMPRESSION: 1.  No acute intra-abdominal process. 2. Mild circumferential bladder wall thickening, favored related to chronic outlet obstruction from prostatomegaly given negative urinalysis. 3.  Aortic atherosclerosis (ICD10-I70.0). Electronically Signed   By: Titus Dubin M.D.   On: 12/24/2018 19:56    Procedures Procedures (including critical care time)  Medications Ordered in ED Medications  acetaminophen (TYLENOL) tablet 1,000 mg (1,000 mg Oral Given 12/24/18 1729)     Initial Impression / Assessment and Plan / ED Course  I have reviewed the triage vital signs and the nursing notes.  Pertinent labs & imaging results that were available during my care of the patient were reviewed by me and considered in my medical decision making (see chart for details).       Patient with multiple medical history presents with epigastric pain, patient has A. fib history and followed by local cardiologist.  Patient has atypical presentation and 3 days of intermittent symptoms.  Plan for screening blood work, troponin and chest x-ray.  Patient's had intermittent right flank pain, minimal tenderness on exam however with age and recurrent symptoms plan for CT stone study for screening to look for pathology/kidney stone.  Patient is well-appearing otherwise in the ER.  Urinalysis pending.  Blood work reviewed minimal decreased sodium, minimal lipase elevation without  central abdominal tenderness.  Troponin negative and patient said symptoms since Monday so I do not  feel a second troponin is warranted. Urinalysis no sign of infection.  CT scan no kidney stone or other acute abnormality.  Patient well-appearing in the ER and stable for outpatient follow-up.   Results and differential diagnosis were discussed with the patient/parent/guardian. Xrays were independently reviewed by myself.  Close follow up outpatient was discussed, comfortable with the plan.   Medications  acetaminophen (TYLENOL) tablet 1,000 mg (1,000 mg Oral Given 12/24/18 1729)    Vitals:   12/24/18 1618 12/24/18 1620 12/24/18 1651  BP:   118/62  Pulse:  (!) 55 81  Resp:  (!) 22 17  Temp:  98.3 F (36.8 C)   TempSrc:  Oral   SpO2:  97% 99%  Weight: 90.3 kg    Height: 6\' 2"  (1.88 m)      Final diagnoses:  Chest pain, atypical  Thrombocytopenia (HCC)  Serum lipase elevation  Right flank pain      Final Clinical Impressions(s) / ED Diagnoses   Final diagnoses:  Chest pain, atypical  Thrombocytopenia (HCC)  Serum lipase elevation  Right flank pain    ED Discharge Orders    None       Elnora Morrison, MD 12/24/18 2035

## 2018-12-24 NOTE — ED Notes (Signed)
Pt. Reports he took Ntg sub lingual x 2 today

## 2018-12-24 NOTE — ED Triage Notes (Signed)
Cough off and on x 6 months. Abdominal pain for a week. Epigastric pain for a week.

## 2018-12-25 DIAGNOSIS — D696 Thrombocytopenia, unspecified: Secondary | ICD-10-CM | POA: Insufficient documentation

## 2018-12-25 HISTORY — DX: Thrombocytopenia, unspecified: D69.6

## 2018-12-28 ENCOUNTER — Telehealth: Payer: Self-pay

## 2018-12-28 ENCOUNTER — Telehealth: Payer: Self-pay | Admitting: Cardiology

## 2018-12-28 NOTE — Telephone Encounter (Signed)
Pt calling reporting 5/10 CP, worse when taking a deep breath. States this is like the chest pain he felt when he went to the ER on 12/24/2018. BP now 97/63, HR 73. Has nitroglycerin but has not taken any. States that tylenol usually helps when he has this pain  pls advise, tx

## 2018-12-28 NOTE — Telephone Encounter (Signed)
Please advise. Thanks.  

## 2018-12-28 NOTE — Telephone Encounter (Signed)
Although I think it is unlikely to be due to isosorbide best to stop

## 2018-12-28 NOTE — Telephone Encounter (Signed)
Patient  reports a rash on stomach that is "thicker than fleas on a dog". He is not having any shortness of breath or other issues. He started taking Isosorbide mononitrate and the rash started. Please advise.

## 2018-12-28 NOTE — Telephone Encounter (Signed)
Stop isosorbide

## 2018-12-28 NOTE — Telephone Encounter (Signed)
Patient notified to stop isosorbide per Dr Bettina Gavia.  Patient agreed to plan and verbalized understanding. Patient will have virtual visit on 12-30-2018 with Dr Bettina Gavia.

## 2018-12-28 NOTE — Telephone Encounter (Signed)
Attempted to contact patient on home phone and cell phone with no answer, unable to leave message. Will continue efforts.

## 2018-12-28 NOTE — Telephone Encounter (Signed)
Patient calling to report slight vertigo today with BP 98/66 and HR 78. Yesterday BP 99/65, HR 60. His right side has also broken out in a rash since starting isosorbide 30 mg daily on 12/23/2018. States he feels more short of breath since isosorbide initiated. His weight today is 195.5 pounds. Yesterday it was 197.5. S/p fall on right side 3 weeks ago with subsequent ER visit, no acute injuries.  pls advise tx!

## 2018-12-28 NOTE — Addendum Note (Signed)
Addended by: Stevan Born on: 12/28/2018 04:24 PM   Modules accepted: Orders

## 2018-12-28 NOTE — Telephone Encounter (Signed)
Reviewed patient's chart, called at instructed to take tylenol since it usually relieves this exact type of pain and call us if it hasn't improved in the morning. Instructed to go to Urgent Care if pain becomes worse or is not relieved by tylenol.

## 2018-12-29 ENCOUNTER — Other Ambulatory Visit: Payer: Self-pay | Admitting: Cardiology

## 2018-12-29 NOTE — Telephone Encounter (Signed)
Called and informed patient of Dr. Joya Gaskins recommendations to go to the [facility] ED for further evaluation of recurring symptoms. Patient states, "I am not going up there. My pain is gone and I don't want to catch that virus." Patient reports that he is going to call Dr. Willette Pa office to be seen tomorrow. Informed patient that we have exhausted all options of treatment from an outpatient standpoint. Patient verbalized understanding but insisted that he feels "better than ever now" and will not go to the ED. He reports his BP is 134/64 and HR is 71. He agreed if symptoms return, he will go to the emergency department for further evaluation. No further questions.

## 2018-12-29 NOTE — Telephone Encounter (Signed)
I see no alternative, should go to [facility] ED and may need admission and cardiac cath, I will call the cardmaster.

## 2018-12-29 NOTE — Telephone Encounter (Signed)
Phoned patient to see how he's feeling this morning. He reports sharp chest pain he was having yesterday went away after taking tylenol but he's having 5/10 burning chest discomfort now and states rash on right side has bumps the size of a dime which were red yesterday and are now blue. BP 123/89, HR 81. Instructed sit down and take 1 sublingual nitroglycerin. Reports no chest pain after 5 minutes. States that his son can take him to doctor's office today.    Please Eric [name]

## 2018-12-30 ENCOUNTER — Telehealth (INDEPENDENT_AMBULATORY_CARE_PROVIDER_SITE_OTHER): Payer: Medicare PPO | Admitting: Cardiology

## 2018-12-30 ENCOUNTER — Encounter: Payer: Self-pay | Admitting: Cardiology

## 2018-12-30 VITALS — BP 133/83 | HR 90 | Ht 74.0 in | Wt 195.5 lb

## 2018-12-30 DIAGNOSIS — I119 Hypertensive heart disease without heart failure: Secondary | ICD-10-CM

## 2018-12-30 DIAGNOSIS — Z7901 Long term (current) use of anticoagulants: Secondary | ICD-10-CM

## 2018-12-30 DIAGNOSIS — R079 Chest pain, unspecified: Secondary | ICD-10-CM

## 2018-12-30 DIAGNOSIS — I482 Chronic atrial fibrillation, unspecified: Secondary | ICD-10-CM

## 2018-12-30 NOTE — Patient Instructions (Addendum)
Medication Instructions:  NONE If you need a refill on your cardiac medications before your next appointment, please call your pharmacy.   Lab work: NONE If you have labs (blood work) drawn today and your tests are completely normal, you will receive your results only by: Marland Kitchen MyChart Message (if you have MyChart) OR . A paper copy in the mail If you have any lab test that is abnormal or we need to change your treatment, we will call you to review the results.  Testing/Procedures: NONE  Follow-Up: At [facility], you and your health needs are our priority.  As part of our continuing mission to provide you with exceptional heart care, we have created designated Provider Care Teams.  These Care Teams include your primary Cardiologist (physician) and Advanced Practice Providers (APPs -  Physician Assistants and Nurse Practitioners) who all work together to provide you with the care you need, when you need it. You will need a follow up appointment in 6 months on 07/05/2019 @ 2:00pm.  Please arrive at 1:40pm

## 2018-12-30 NOTE — Progress Notes (Signed)
Virtual Visit via Telephone Note   This visit type was conducted due to national recommendations for restrictions regarding the COVID-19 Pandemic (e.g. social distancing) in an effort to limit this patient's exposure and mitigate transmission in our community.  Due to his co-morbid illnesses, this patient is at least at moderate risk for complications without adequate follow up.  This format is felt to be most appropriate for this patient at this time.  The patient did not have access to video technology/had technical difficulties with video requiring transitioning to audio format only (telephone).  All issues noted in this document were discussed and addressed.  No physical exam could be performed with this format.  Please refer to the patient's chart for his  consent to telehealth for [facility].   Evaluation Performed:  Follow-up visit  Date:  12/30/2018   ID:  ILLIAS [name], DOB Feb 12, 1931, MRN [id number]  Patient Location: Home  Provider Location: Home  PCP:  Raina Mina., MD  Cardiologist:  No primary care provider on file. Dr Bettina Gavia Electrophysiologist:  None   Chief Complaint:  Chest pain  History of Present Illness:    Eric [name] is a 85 y.o. male who presents via audio/video conferencing for a telehealth visit today.    This visit is in follow-up to multiple phone calls and an ED visit for chest pain.  He does not have a computer or video phone at home.  He told me he was seen earlier today I think by his PCP diagnosed with herpes zoster and is having upper abdominal lower chest constant burning where there is the rash.  He is not having angina when he took nitroglycerin he had no relief and in retrospect that is what this series of phone calls and emergency room visit has been about.  He is not having shortness of breath edema no fever cough shortness of breath and no bleeding from his anticoagulant managed through his primary care physician.  He follows blood  pressure and heart rate at home and his atrial fibrillation has been rates of 60 to 80 bpm.  The patient does not have symptoms concerning for COVID-19 infection (fever, chills, cough, or new shortness of breath).    Past Medical History:  Diagnosis Date   Atrial fibrillation ([place])    Blood transfusion without reported diagnosis    CAD (coronary artery disease)    CHF (congestive heart failure) (HCC)    GERD (gastroesophageal reflux disease)    GI bleed    Hyperlipidemia    Hypertension    NSAID-induced gastric ulcer    Trigeminal neuralgia    Past Surgical History:  Procedure Laterality Date   CHOLECYSTECTOMY     ESOPHAGOGASTRODUODENOSCOPY  2011   FLEXIBLE SIGMOIDOSCOPY N/A 06/04/2013   Procedure: FLEXIBLE SIGMOIDOSCOPY;  Surgeon: Winfield Cunas., MD;  [facility] ENDOSCOPY;  Service: Endoscopy;  Laterality: N/A;   HIP SURGERY  August 2008   Right total hip replacement   SHOULDER SURGERY     Left   TOTAL KNEE ARTHROPLASTY  June 2009   Left     Current Meds  Medication Sig   acetaminophen (TYLENOL) 325 MG tablet Take 1 tablet by mouth every 6 (six) hours as needed.    albuterol (PROAIR HFA) 108 (90 BASE) MCG/ACT inhaler Inhale 2 puffs into the lungs every 6 (six) hours as needed for wheezing or shortness of breath.   atorvastatin (LIPITOR) 40 MG tablet TAKE 1 TABLET AT BEDTIME  benzonatate (TESSALON) 100 MG capsule Take 1 capsule (100 mg total) by mouth 2 (two) times daily as needed for cough.   Bimatoprost (LUMIGAN) 0.01 % SOLN Place 1 drop into both eyes at bedtime. Both eyes at hour of sleep   bisacodyl (DULCOLAX) 5 MG EC tablet Take 1 tablet by mouth as needed.   Calcium Carb-Cholecalciferol (CALCIUM-VITAMIN D) 500-200 MG-UNIT tablet Take 1 tablet by mouth 2 (two) times daily.   carvedilol (COREG) 3.125 MG tablet Take by mouth.   clorazepate (TRANXENE) 3.75 MG tablet Take 1 tablet (3.75 mg total) by mouth 3 (three) times daily as needed for  anxiety.   cycloSPORINE (RESTASIS) 0.05 % ophthalmic emulsion Place 1 drop into both eyes 2 (two) times daily.    Difluprednate (DUREZOL) 0.05 % EMUL Place 1 drop into both eyes daily.    docusate sodium (COLACE) 100 MG capsule Take 100 mg by mouth 2 (two) times daily.    escitalopram (LEXAPRO) 5 MG tablet Take 1 tablet by mouth daily.   ferrous sulfate 325 (65 FE) MG tablet Take 1 tablet by mouth daily.   fexofenadine (ALLEGRA) 180 MG tablet Take 180 mg by mouth daily.    furosemide (LASIX) 20 MG tablet TAKE 2 TABLETS EVERY DAY   LACTOBACILLUS PO Take 1 capsule by mouth daily.   metFORMIN (GLUCOPHAGE-XR) 500 MG 24 hr tablet TAKE 1 TABLET(500 MG) BY MOUTH DAILY WITH BREAKFAST   metoprolol succinate (TOPROL-XL) 25 MG 24 hr tablet Take 1 tablet (25 mg total) by mouth daily.   multivitamin (THERAGRAN) per tablet Take 1 tablet by mouth daily.    mupirocin ointment (BACTROBAN) 2 % Apply topically.   nitroGLYCERIN (NITROSTAT) 0.4 MG SL tablet Place 0.4 mg under the tongue every 5 (five) minutes as needed for chest pain.   pantoprazole (PROTONIX) 40 MG tablet TAKE 1 TABLET BY MOUTH EVERY DAY   sucralfate (CARAFATE) 1 g tablet Take 1 g by mouth daily.    tamsulosin (FLOMAX) 0.4 MG CAPS capsule Take 0.4 mg by mouth daily.    vitamin C (ASCORBIC ACID) 500 MG tablet Take 1,000 mg by mouth daily.      Allergies:   Enoxaparin sodium [enoxaparin sodium]; Codeine; Oxycodone hcl; Sulfa antibiotics; Sulfamethoxazole; Vicodin [hydrocodone-acetaminophen]; Sulfonamide derivatives; and Tramadol   Social History   Tobacco Use   Smoking status: Former Smoker    Last attempt to quit: 09/23/1958    Years since quitting: 60.3   Smokeless tobacco: Former Systems developer   Tobacco comment: quit smoking1980-quit chewing 6 months  Substance Use Topics   Alcohol use: No   Drug use: No     Family Hx: The patient's family history includes Alzheimer's disease in his mother; Diabetes in his sister; Heart  attack in his father.  ROS:   Please see the history of present illness.     All other systems reviewed and are negative.   Prior CV studies:   The following studies were reviewed today:  I reviewed the emergency room visit note from 12/24/2018 prompted by concern of acute coronary syndrome his EKG did not show new ischemic changes his troponin was 0 and it was quite uncertain what the etiology is abdominal and chest pain once as he did not have a rash at that time his lipase was mildly elevated.  Labs/Other Tests and Data Reviewed:    EKG:  An ECG dated 12/24/2018 was personally reviewed today and demonstrated:  Stable pattern rate controlled atrial fibrillation right bundle branch block no ischemic  changes  Recent Labs: 12/24/2018: ALT 27; BUN 15; Creatinine, Ser 0.85; Hemoglobin 11.6; Platelets 94; Potassium 4.2; Sodium 134   Recent Lipid Panel Lab Results  Component Value Date/Time   CHOL 151 09/03/2012 08:04 AM   TRIG 126.0 09/03/2012 08:04 AM   HDL 40.10 09/03/2012 08:04 AM   CHOLHDL 4 09/03/2012 08:04 AM   LDLCALC 86 09/03/2012 08:04 AM    Wt Readings from Last 3 Encounters:  12/30/18 195 lb 8 oz (88.7 kg)  12/24/18 199 lb (90.3 kg)  06/24/18 210 lb (95.3 kg)     Objective:    Vital Signs:  BP 133/83 (BP Location: Left Arm, Patient Position: Sitting)    Pulse 90    Ht 6\' 2"  (1.88 m)    Wt 195 lb 8 oz (88.7 kg)    BMI 25.10 kg/m    Well nourished, well developed male in no acute distress.   ASSESSMENT & PLAN:    1. Chest pain in adult, in retrospect this was all very atypical right upper abdomen right lower chest burning neuropathic pain and he is developed typical herpes zoster.  Care is been directed through his PCP.  I told him to stop taking nitroglycerin for this.  He has no indication of acute coronary syndrome at this time I would not schedule an ischemic evaluation. 2. Atrial fibrillation chronic rate is controlled continue metoprolol and warfarin directed  by his PCP goal INR is 2.5 3. Chronic anticoagulation continue warfarin managed by his PCP his last INR 1.6 goal 2.5 4. Stable hypertension blood pressure target continue furosemide beta-blocker 5. Hyperlipidemia stable continue with statin his last lipid profile 12/21/2018 showed a cholesterol 99 LDL 58 HDL 37 at target 6. COPD stable continue his bronchodilators  COVID-19 Education: The signs and symptoms of COVID-19 were discussed with the patient and how to seek care for testing (follow up with PCP or arrange E-visit).  The importance of social distancing was discussed today.  Time:   Today, I have spent 40 minutes with the patient with telehealth technology discussing the above problems.  This was a very complicated visit including reviewing the emergency room records note from our nurse who triaged him on multiple occasions review of records from his primary care physician and discussion of his interaction earlier today with a new diagnosis of herpes zoster.  I cautioned his wife to avoid skin contact   Medication Adjustments/Labs and Tests Ordered: Current medicines are reviewed at length with the patient today.  Concerns regarding medicines are outlined above.  Tests Ordered: No orders of the defined types were placed in this encounter.  Medication Changes: No orders of the defined types were placed in this encounter.   Disposition:  Follow up in 6 month(s)  Signed, Shirlee More, MD  12/30/2018 10:20 AM    [place]

## 2019-01-08 ENCOUNTER — Telehealth: Payer: Self-pay | Admitting: Cardiology

## 2019-01-08 NOTE — Telephone Encounter (Signed)
Patient called to ask Dr. Bettina Gavia if he can go outside while taking shingles medication. Informed patient that medication was not prescribed by Dr. Bettina Gavia and that he should contact the ordering physician for direction on that question. Patient states Dr. Willette Pa office is closed and there was no way to leave a message. Called and left message for on-call MD to call us back.

## 2019-01-08 NOTE — Telephone Encounter (Signed)
Patient aware per Dr Joya Gaskins order that he can go out in the sun while taking acyclovir, but he should apply sunscreen for long periods of sun exposure. Patient agreed to plan and verbalized understanding.

## 2019-01-08 NOTE — Telephone Encounter (Signed)
Patient called wanting to know if he has to stay in with the medicine Dr Bettina Gavia put him on last Friday for shingles

## 2019-01-18 ENCOUNTER — Telehealth: Payer: Self-pay | Admitting: *Deleted

## 2019-01-18 NOTE — Telephone Encounter (Signed)
Pt has shingles for almost 3 weeks. Is getting better but still there. Experiencing pain with these. Pain in left shoulder onset last night and 3 or 4 times today. Stomach pain also. Burning where shingles are. Called PCP this am but hasn't heard back from them. Please advise.

## 2019-01-18 NOTE — Telephone Encounter (Signed)
Patient advised to contact his PCP's office again to follow up for further advisement as this is not a cardiac issue. Patient verbalized understanding. No further questions.

## 2019-01-29 ENCOUNTER — Telehealth: Payer: Self-pay | Admitting: Cardiology

## 2019-01-29 NOTE — Telephone Encounter (Signed)
Patient phoned, explained that we're treating him for his heart, we do not treat shingles. Instructed to call Dr. Willette Pa office, gave patient his office number.

## 2019-01-29 NOTE — Telephone Encounter (Signed)
Patient wants to speak to a nurse about his shingles.

## 2019-02-04 DIAGNOSIS — B0229 Other postherpetic nervous system involvement: Secondary | ICD-10-CM | POA: Insufficient documentation

## 2019-02-04 HISTORY — DX: Other postherpetic nervous system involvement: B02.29

## 2019-02-05 ENCOUNTER — Ambulatory Visit: Payer: Medicare PPO | Admitting: Sports Medicine

## 2019-02-18 ENCOUNTER — Ambulatory Visit: Payer: Medicare PPO | Admitting: Cardiology

## 2019-03-15 ENCOUNTER — Ambulatory Visit: Payer: Medicare PPO | Admitting: Cardiology

## 2019-03-15 DIAGNOSIS — F321 Major depressive disorder, single episode, moderate: Secondary | ICD-10-CM

## 2019-03-15 HISTORY — DX: Major depressive disorder, single episode, moderate: F32.1

## 2019-05-17 ENCOUNTER — Telehealth: Payer: Self-pay | Admitting: Cardiology

## 2019-05-17 ENCOUNTER — Other Ambulatory Visit: Payer: Self-pay

## 2019-05-17 NOTE — Telephone Encounter (Signed)
I would not refill it if it is not being prescribed at the time of his last visit.

## 2019-05-17 NOTE — Telephone Encounter (Signed)
Reviewed patient's chart, as it appeared he was no longer on Isosorbide. He was contacted in April by Elmyra Ricks to stop this medication. I have contacted [facility] and they let me know that he had a refill on December 23, 2018 prior to being advised to stop and then appears to have randomly decided to get it refilled last month. Please advise.

## 2019-05-17 NOTE — Telephone Encounter (Signed)
Left message for patient to call back  

## 2019-05-17 NOTE — Telephone Encounter (Signed)
°*  STAT* If patient is at the pharmacy, call can be transferred to refill team.   1. Which medications need to be refilled? (please list name of each medication and dose if known) isosorbide mononitrate (IMDUR) 30 MG 24 hr tablet  2. Which pharmacy/location (including street and city if local pharmacy) is medication to be sent to?  [facility] [place], [place] AT [address] [phone] (Phone) [phone] (Fax)    3. Do they need a 30 day or 90 day supply?

## 2019-05-18 NOTE — Telephone Encounter (Signed)
Spoke with patient, advised him that he had been contacted in April about stopping his Isosorbide. I let him know that he needed to make sure he need to stop it and that we would not be refilling it at this time. He expressed understanding.

## 2019-06-30 ENCOUNTER — Telehealth: Payer: Self-pay | Admitting: *Deleted

## 2019-06-30 NOTE — Telephone Encounter (Signed)
Pt is no longer on this medication. Sent fax back letting pharmacy know this.   *STAT* If patient is at the pharmacy, call can be transferred to refill team.   1. Which medications need to be refilled? (please list name of each medication and dose if known) Isosorbide 30 mg  2. Which pharmacy/location (including street and city if local pharmacy) is medication to be sent to?[address]  3. Do they need a 30 day or 90 day supply? [place]

## 2019-07-05 ENCOUNTER — Ambulatory Visit: Payer: Medicare PPO | Admitting: Cardiology

## 2019-07-29 DIAGNOSIS — B029 Zoster without complications: Secondary | ICD-10-CM

## 2019-07-29 HISTORY — DX: Zoster without complications: B02.9

## 2019-08-01 NOTE — Progress Notes (Signed)
Cardiology Office Note:    Date:  08/03/2019   ID:  [name], DOB 1931-04-08, MRN [id number]  PCP:  Raina Mina., MD  Cardiologist:  Shirlee More, MD    Referring MD: Raina Mina., MD   Barnetta Chapel will you add ASSESSMENT:   And pro time on him an INR 1. Chronic atrial fibrillation (Eric [name])   2. Chronic anticoagulation   3. Hypertensive heart disease without heart failure   4. Coronary artery disease involving native coronary artery of native heart without angina pectoris   5. Amaurosis fugax, both eyes    PLAN:    In order of problems listed above:  1. Continue beta-blocker warfarin and aspirin with his visual TIA 7-day ZIO to assess heart rate response 2. Check INR today he was suboptimal and his last had a recent TIA 3. Stable continue current antihypertensives he is no longer taking a diuretic 4. Mild CAD continue medical therapy including high intensity statin his lipids are ideal 5. Recent TIA check INR carotid duplex CT of the head and 7-day monitor.  And add aspirin to his warfarin.   Next appointment: 4 weeks   Medication Adjustments/Labs and Tests Ordered: Current medicines are reviewed at length with the patient today.  Concerns regarding medicines are outlined above.  No orders of the defined types were placed in this encounter.  No orders of the defined types were placed in this encounter.   Chief Complaint  Patient presents with  . Follow-up  . Atrial Fibrillation  . Anticoagulation  . Hypertension     and COPD    History of Present Illness:    Eric [name] is a 85 y.o. male with a hx of chronic AF on warfarin, non anginal chest pain, hypertension and COPD  last seen 12/30/2018. Compliance with diet, lifestyle and medications: In general he has done well and has been compliant.  He has his usual shortness of breath then as the visit goes on surprises me by telling me an episode recently where he had visual loss total in the right eye  portion of the left eye that lasted 5 or 10 minutes.  He had no palpitation did lose consciousness and says he has had previous carotid duplex[facility] done at [facility] was unaware of the results.  His warfarin and that is managed by his primary care physician his last INR was 1.55.  For further evaluation of liver drawn INR today add aspirin low-dose 81 mg daily carotid duplex and CT scan of his head.  We will do an EKG to confirm his heart rate response to atrial fibrillation 7-day ZIO monitor and see back in my office in several weeks.  Other labs from that date include hemoglobin 11.9 normal indices TSH normal 2.07 cholesterol 89 LDL 50 triglycerides 58 HDL 33 potassium 3.8 creatinine 0.86 Past Medical History:  Diagnosis Date  . Atrial fibrillation ([place])   . Blood transfusion without reported diagnosis   . CAD (coronary artery disease)   . CHF (congestive heart failure) ([place])   . GERD (gastroesophageal reflux disease)   . GI bleed   . Hyperlipidemia   . Hypertension   . NSAID-induced gastric ulcer   . Trigeminal neuralgia     Past Surgical History:  Procedure Laterality Date  . CHOLECYSTECTOMY    . ESOPHAGOGASTRODUODENOSCOPY  2011  . FLEXIBLE SIGMOIDOSCOPY N/A 06/04/2013   Procedure: FLEXIBLE SIGMOIDOSCOPY;  Surgeon: Winfield Cunas., MD;  Location: [facility] ENDOSCOPY;  Service: Endoscopy;  Laterality: N/A;  . HIP SURGERY  August 2008   Right total hip replacement  . SHOULDER SURGERY     Left  . TOTAL KNEE ARTHROPLASTY  June 2009   Left    Current Medications: Current Meds  Medication Sig  . albuterol (PROAIR HFA) 108 (90 BASE) MCG/ACT inhaler Inhale 2 puffs into the lungs every 6 (six) hours as needed for wheezing or shortness of breath.  Marland Kitchen atorvastatin (LIPITOR) 40 MG tablet TAKE 1 TABLET AT BEDTIME  . bethanechol (URECHOLINE) 25 MG tablet Take 1 tablet by mouth 2 (two) times daily.  . Bimatoprost (LUMIGAN) 0.01 % SOLN Place 1 drop into both eyes at bedtime. Both eyes at  hour of sleep  . bisacodyl (DULCOLAX) 5 MG EC tablet Take 1 tablet by mouth as needed.  . Cholecalciferol (VITAMIN D3) 25 MCG (1000 UT) CAPS Take 1 capsule by mouth daily.  . cycloSPORINE (RESTASIS) 0.05 % ophthalmic emulsion Place 1 drop into both eyes 2 (two) times daily.   . Difluprednate (DUREZOL) 0.05 % EMUL Place 1 drop into both eyes daily.   Marland Kitchen docusate sodium (COLACE) 100 MG capsule Take 100 mg by mouth 2 (two) times daily.   Marland Kitchen escitalopram (LEXAPRO) 5 MG tablet Take 1 tablet by mouth daily.  . fexofenadine (ALLEGRA) 180 MG tablet Take 180 mg by mouth daily.   . furosemide (LASIX) 20 MG tablet Take 20 mg by mouth daily.  Marland Kitchen gabapentin (NEURONTIN) 100 MG capsule Take 200 mg by mouth 2 (two) times daily.  Marland Kitchen lidocaine (LIDODERM) 5 % 1 patch as directed.  . metoprolol succinate (TOPROL-XL) 25 MG 24 hr tablet Take 12.5 mg by mouth daily.  . multivitamin (THERAGRAN) per tablet Take 1 tablet by mouth daily.   . mupirocin ointment (BACTROBAN) 2 % Apply topically.  . nitroGLYCERIN (NITROSTAT) 0.4 MG SL tablet Place 0.4 mg under the tongue every 5 (five) minutes as needed for chest pain.  Marland Kitchen omeprazole (PRILOSEC) 20 MG capsule Take 20 mg by mouth daily.  . tamsulosin (FLOMAX) 0.4 MG CAPS capsule Take 0.4 mg by mouth daily.   . vitamin C (ASCORBIC ACID) 500 MG tablet Take 1,000 mg by mouth daily.   Marland Kitchen warfarin (COUMADIN) 5 MG tablet Take 5 mg by mouth as directed. Takes 1 tablet daily except 0.5 tablet on Mon, Wed, and Fri     Allergies:   Enoxaparin sodium [enoxaparin sodium], Codeine, Oxycodone hcl, Simvastatin, Sulfa antibiotics, Sulfamethoxazole, Vicodin [hydrocodone-acetaminophen], Gemfibrozil, Sulfonamide derivatives, and Tramadol   Social History   Socioeconomic History  . Marital status: Married    Spouse name: Not on file  . Number of children: Not on file  . Years of education: Not on file  . Highest education level: Not on file  Occupational History  . Not on file  Social Needs   . Financial resource strain: Not on file  . Food insecurity    Worry: Not on file    Inability: Not on file  . Transportation needs    Medical: Not on file    Non-medical: Not on file  Tobacco Use  . Smoking status: Former Smoker    Quit date: 09/23/1958    Years since quitting: 60.9  . Smokeless tobacco: Former Systems developer  . Tobacco comment: quit smoking1980-quit chewing 6 months  Substance and Sexual Activity  . Alcohol use: No  . Drug use: No  . Sexual activity: Not on file  Lifestyle  . Physical activity    Days per week:  Not on file    Minutes per session: Not on file  . Stress: Not on file  Relationships  . Social Herbalist on phone: Not on file    Gets together: Not on file    Attends religious service: Not on file    Active member of club or organization: Not on file    Attends meetings of clubs or organizations: Not on file    Relationship status: Not on file  Other Topics Concern  . Not on file  Social History Narrative  . Not on file     Family History: The patient's family history includes Alzheimer's disease in his mother; Diabetes in his sister; Heart attack in his father. ROS:   Please see the history of present illness.    All other systems reviewed and are negative.  EKGs/Labs/Other Studies Reviewed:    The following studies were reviewed today:  EKG:  EKG ordered today and personally reviewed.  The ekg ordered today demonstrates atrial fibrillation controlled ventricular rate incomplete right bundle branch block  Recent Labs: 12/24/2018: ALT 27; BUN 15; Creatinine, Ser 0.85; Hemoglobin 11.6; Platelets 94; Potassium 4.2; Sodium 134  Recent Lipid Panel    Component Value Date/Time   CHOL 151 09/03/2012 0804   TRIG 126.0 09/03/2012 0804   HDL 40.10 09/03/2012 0804   CHOLHDL 4 09/03/2012 0804   VLDL 25.2 09/03/2012 0804   LDLCALC 86 09/03/2012 0804    Physical Exam:    VS:  BP 120/60 (BP Location: Left Arm, Patient Position: Sitting, Cuff  Size: Normal)   Pulse 80   Ht 6\' 2"  (1.88 m)   Wt 199 lb 6.4 oz (90.4 kg)   SpO2 93%   BMI 25.60 kg/m     Wt Readings from Last 3 Encounters:  08/03/19 199 lb 6.4 oz (90.4 kg)  12/30/18 195 lb 8 oz (88.7 kg)  12/24/18 199 lb (90.3 kg)     GEN: He appears quite frail he is now using a walker with gait dysfunction and falls Well nourished, well developed in no acute distress HEENT: Normal NECK: No JVD; No carotid bruits LYMPHATICS: No lymphadenopathy CARDIAC: Irregular variable first heart sound RRR, no murmurs, rubs, gallops RESPIRATORY:  Clear to auscultation without rales, wheezing or rhonchi  ABDOMEN: Soft, non-tender, non-distended MUSCULOSKELETAL: 1+ bilateral lower extremity pitting edema; No deformity  SKIN: Warm and dry NEUROLOGIC:  Alert and oriented x 3 PSYCHIATRIC:  Normal affect    Signed, Shirlee More, MD  08/03/2019 2:08 PM    [facility]

## 2019-08-03 ENCOUNTER — Other Ambulatory Visit: Payer: Self-pay

## 2019-08-03 ENCOUNTER — Ambulatory Visit (INDEPENDENT_AMBULATORY_CARE_PROVIDER_SITE_OTHER): Payer: Medicare PPO | Admitting: Cardiology

## 2019-08-03 ENCOUNTER — Encounter: Payer: Self-pay | Admitting: Cardiology

## 2019-08-03 ENCOUNTER — Ambulatory Visit (INDEPENDENT_AMBULATORY_CARE_PROVIDER_SITE_OTHER): Payer: Medicare PPO

## 2019-08-03 VITALS — BP 120/60 | HR 80 | Ht 74.0 in | Wt 199.4 lb

## 2019-08-03 DIAGNOSIS — Z7901 Long term (current) use of anticoagulants: Secondary | ICD-10-CM

## 2019-08-03 DIAGNOSIS — I482 Chronic atrial fibrillation, unspecified: Secondary | ICD-10-CM | POA: Diagnosis not present

## 2019-08-03 DIAGNOSIS — I119 Hypertensive heart disease without heart failure: Secondary | ICD-10-CM

## 2019-08-03 DIAGNOSIS — G459 Transient cerebral ischemic attack, unspecified: Secondary | ICD-10-CM | POA: Diagnosis not present

## 2019-08-03 DIAGNOSIS — I2510 Atherosclerotic heart disease of native coronary artery without angina pectoris: Secondary | ICD-10-CM

## 2019-08-03 DIAGNOSIS — G453 Amaurosis fugax: Secondary | ICD-10-CM

## 2019-08-03 MED ORDER — ASPIRIN EC 81 MG PO TBEC: 81.0000 mg | DELAYED_RELEASE_TABLET | Freq: Every day | ORAL | 3 refills | Status: DC

## 2019-08-03 NOTE — Patient Instructions (Addendum)
Medication Instructions:  Your physician has recommended you make the following change in your medication:   START aspirin 81 mg: Take 1 tablet daily  *If you need a refill on your cardiac medications before your next appointment, please call your pharmacy*  Lab Work: Your physician recommends that you return for lab work today: PT/INR.   If you have labs (blood work) drawn today and your tests are completely normal, you will receive your results only by: Marland Kitchen MyChart Message (if you have MyChart) OR . A paper copy in the mail If you have any lab test that is abnormal or we need to change your treatment, we will call you to review the results.  Testing/Procedures: You had an EKG today.   Your physician has recommended that you wear a ZIO monitor. ZIO monitors are medical devices that record the heart's electrical activity. Doctors most often use these monitors to diagnose arrhythmias. Arrhythmias are problems with the speed or rhythm of the heartbeat. The monitor is a small, portable device. You can wear one while you do your normal daily activities. This is usually used to diagnose what is causing palpitations/syncope (passing out). Wear for 7 days.   Follow-Up: At [facility], you and your health needs are our priority.  As part of our continuing mission to provide you with exceptional heart care, we have created designated Provider Care Teams.  These Care Teams include your primary Cardiologist (physician) and Advanced Practice Providers (APPs -  Physician Assistants and Nurse Practitioners) who all work together to provide you with the care you need, when you need it.  Your next appointment:   4 weeks  The format for your next appointment:   In Person  Provider:   Shirlee More, MD    Aspirin and Your Heart  Aspirin is a medicine that prevents the cells in the blood that are used for clotting, called platelets, from sticking together. Aspirin can be used to help reduce the risk  of blood clots, heart attacks, and other heart-related problems. Can I take aspirin? Your health care provider will help you determine whether it is safe and beneficial for you to take aspirin daily. Taking aspirin daily may be helpful if you:  Have had a heart attack or chest pain.  Are at risk for a heart attack.  Have undergone open-heart surgery, such as coronary artery bypass surgery (CABG).  Have had coronary angioplasty or a stent.  Have had certain types of stroke or transient ischemic attack (TIA).  Have peripheral artery disease (PAD).  Have chronic heart rhythm problems such as atrial fibrillation and cannot take an anticoagulant.  Have valve disease or have had surgery on a valve. What are the risks? Daily use of aspirin can cause side effects. Some of these include:  Bleeding. Bleeding problems can be minor or serious. An example of a minor problem is a cut that does not stop bleeding. An example of a more serious problem is stomach bleeding or, rarely, bleeding into the brain. Your risk of bleeding is increased if you are also taking non-steroidal anti-inflammatory drugs (NSAIDs).  Increased bruising.  Upset stomach.  An allergic reaction. People who have nasal polyps have an increased risk of developing an aspirin allergy. General guidelines  Take aspirin only as told by your health care provider. Make sure that you understand how much you should take and what form you should take. The two forms of aspirin are: ? Non-enteric-coated.This type of aspirin does not have a  coating and is absorbed quickly. This type of aspirin also comes in a chewable form. ? Enteric-coated. This type of aspirin has a coating that releases the medicine very slowly. Enteric-coated aspirin might cause less stomach upset than non-enteric-coated aspirin. This type of aspirin should not be chewed or crushed.  Limit alcohol intake to no more than 1 drink a day for nonpregnant women and 2 drinks  a day for men. Drinking alcohol increases your risk of bleeding. One drink equals 12 oz of beer, 5 oz of wine, or 1 oz of hard liquor. Contact a health care provider if you:  Have unusual bleeding or bruising.  Have stomach pain or nausea.  Have ringing in your ears.  Have an allergic reaction that causes: ? Hives. ? Itchy skin. ? Swelling of the lips, tongue, or face. Get help right away if you:  Notice that your bowel movements are bloody, dark red, or black in color.  Vomit or cough up blood.  Have blood in your urine.  Cough, have noisy breathing (wheeze), or feel short of breath.  Have chest pain, especially if the pain spreads to the arms, back, neck, or jaw.  Have a severe headache, or a headache with confusion, or dizziness. These symptoms may represent a serious problem that is an emergency. Do not wait to see if the symptoms will go away. Get medical help right away. Call your local emergency services (911 in the [place] Do not drive yourself to the hospital. Summary  Aspirin can be used to help reduce the risk of blood clots, heart attacks, and other heart-related problems.  Daily use of aspirin can increase your risk of side effects. Your health care provider will help you determine whether it is safe and beneficial for you to take aspirin daily.  Take aspirin only as told by your health care provider. Make sure that you understand how much you can take and what form you can take. This information is not intended to replace advice given to you by your health care provider. Make sure you discuss any questions you have with your health care provider. Document Released: 08/22/2008 Document Revised: 07/10/2017 Document Reviewed: 07/10/2017 Elsevier Patient Education  2020 [employer].

## 2019-08-04 ENCOUNTER — Telehealth: Payer: Self-pay

## 2019-08-04 LAB — PROTIME-INR
INR: 1.4 — ABNORMAL HIGH (ref 0.9–1.2)
Prothrombin Time: 15 s — ABNORMAL HIGH (ref 9.1–12.0)

## 2019-08-04 MED ORDER — APIXABAN 5 MG PO TABS: 5.0000 mg | ORAL_TABLET | Freq: Two times a day (BID) | ORAL | 1 refills | Status: DC

## 2019-08-04 NOTE — Telephone Encounter (Signed)
Patient informed of lab results and advised of Dr. Joya Gaskins recommendations. Patient is agreeable to starting eliquis 5 mg twice daily. Prescription has been sent to [facility] in [place] as requested. Patient will stop by the [place] office in the morning to pick up a 30 day free trial eliquis coupon before going to pick up the prescription. Patient advised to not take his coumadin tonight but to take aspirin 81 mg. He understands that he will no longer have to go to Dr. Willette Pa office for INR checks. Patient is agreeable and verbalized understanding. No further questions.

## 2019-08-04 NOTE — Telephone Encounter (Signed)
-----   Message from Richardo Priest, MD sent at 08/04/2019  9:03 AM EST ----- Normal or stable result  This is very dangerous he had a recent TIA with visual loss he has no effective anticoagulation with warfarin.  I would like him today to stop warfarin and start Eliquis 5 mg twice daily or start Lovenox 1 mg/kg every 12 hours for 5 days and he needs to decide if his warfarin is managed in our office through the clinic or contact Dr. Rayetta Pigg office and send a copy of this test to him.

## 2019-08-10 DIAGNOSIS — I482 Chronic atrial fibrillation, unspecified: Secondary | ICD-10-CM

## 2019-08-10 DIAGNOSIS — G459 Transient cerebral ischemic attack, unspecified: Secondary | ICD-10-CM

## 2019-09-01 ENCOUNTER — Telehealth: Payer: Self-pay | Admitting: *Deleted

## 2019-09-01 DIAGNOSIS — I472 Ventricular tachycardia: Secondary | ICD-10-CM

## 2019-09-01 DIAGNOSIS — I4729 Other ventricular tachycardia: Secondary | ICD-10-CM

## 2019-09-01 NOTE — Telephone Encounter (Signed)
Patient informed of monitor results and advised that Dr. Bettina Gavia recommends patient see Dr. Curt Bears for further evaluation of non-sustained ventricular tachycardia. Patient is agreeable and appointment has been scheduled with Dr. Curt Bears on Monday, 10/11/2019, at 11:45 am in the [place] office. Patient verbalized understanding. No further questions.

## 2019-09-01 NOTE — Progress Notes (Signed)
Cardiology Office Note:    Date:  09/02/2019   ID:  Eric [name], DOB 1930-11-23, MRN [id number]  PCP:  Eric [name]., MD  Cardiologist:  Eric More, MD    Referring MD: Eric [name]., MD    ASSESSMENT:    1. Chronic anticoagulation   2. Chronic atrial fibrillation (HCC)   3. Non-sustained ventricular tachycardia (Eric [name])   4. Hypertensive heart disease without heart failure   5. Night sweat    PLAN:    In order of problems listed above: 1.  Continue Eliquis low-dose aspirin recheck CBC today and platelet count 2.  Rate is controlled he had a recent TIA continue low-dose aspirin Eliquis and beta-blocker.  I am concerned he requires an antiarrhythmic drug with his nonsustained VT and prior EP evaluation we will check echocardiogram for ejection fraction and a myocardial perfusion study.  Unfortunately he is a poor candidate for cardiac interventions 3 referral to EP for decision about antiarrhythmic drug 4 continue his diuretic check proBNP level I suspect gabapentin is the etiology of his edema 5 I do not have a discrete answer for his night sweats he has an abnormal CBC he is at risk for hematologic abnormalities and asked him to do his temperature twice daily potential for localizing symptoms specially COVID-19 I will recheck a CBC on him today.   Next appointment: 3 months with me he has an appointment to be seen by EP I have asked him to have both echocardiogram and a myocardial perfusion study done prior to that   Medication Adjustments/Labs and Tests Ordered: Current medicines are reviewed at length with the patient today.  Concerns regarding medicines are outlined above.  No orders of the defined types were placed in this encounter.  No orders of the defined types were placed in this encounter.   Chief Complaint  Patient presents with  . 1 month follow up    History of Present Illness:    Eric [name] is a 85 y.o. male with a hx of chronic AF on  warfarin, non anginal chest pain, hypertension and COPD  last seen 08/03/2019 after TIA with bilateral occipital visual loss with inadequate INR. He was transitioned to direct anticoagulant and a subsequent ambulatory heart rhythm monitor showed nonsustained ventricular tachycardia.  I have discussed the case verbally with the EP at 1 seen in consultation and I am concerned that he requires antiarrhythmic drug therapy.  He has a history of remote normal coronary angiography from record review in care everywhere.  2015 myocardial perfusion study at [facility] showed mild inferior lateral ischemia EF 52% Compliance with diet, lifestyle and medications: Yes  Reviewed the report below with the patient and his wife I printed a prescription for the nonsustained ventricular tachycardia highlighted with a yellow marker I explained to them the significance is a potentially dangerous heart rhythm and the need for further evaluation.  It was a difficult discussion they appeared to understand and voiced agreement to be seen by EP and further testing  Study Highlights A Zio  Monitor was performed for 6 days and 20 hours beginning 08/03/2019 to assess recent TIA with visual loss in the setting of atrial fibrillation. The rhythm throughout was continuously atrial fibrillation with minimum average and maximum heart rates of 39 66, and 76 bpm.  Both day and nighttime heart rate distributions were 99% in range 50 to 110 bpm. There is a single pause of greater than 3 seconds 3.2 seconds noted. There  is one triggered event associated with atrial fibrillation.  There were no diary events. Ventricular ectopy was rare with PVCs and couplets.  There were 4 brief episodes of nonsustained ventricular tachycardia the longest 7 complexes at a rate of 124 bpm. There was an instance at 08/10/2019 4:52 AM where there were 3 episodes of nonsustained ventricular tachycardia interspersed between sent a single conducted complex  of atrial fibrillation.  This was asymptomatic.  Conclusion: 1.  Atrial fibrillation with a controlled ventricular rate 2.  Although ventricular ectopy is rare there is an instance of 3 clustered episodes of nonsustained ventricular tachycardia.    He has to come the office and brought his wife and wants to know why he needs to be seen by EP.  I reviewed his records he has heart failure he has an abnormal perfusion study in the past chronic A. fib and had recurrent episodes of nonsustained ventricular tachycardia that was clustered.  It would be seen by EP in consultation prior that we will repeat an echo and a perfusion study.  The question is whether he requires antiarrhythmic drug therapy like amiodarone.  He has had no recurrent visual loss since he transition from warfarin to Eliquis and is not having any shortness of breath orthopnea chest pain palpitation or syncope but he notices edema and he takes gabapentin that can cause sodium retention.  1026 had labs in his PCP office hemoglobin is 11.9 platelets 109,000 cholesterol 89 LDL 50 TSH normal and CMP normal potassium 3.8 normal liver function and creatinine.  As a final note he tells me he is having intense night sweats but no fever or chills.  He has no respiratory symptoms or Covid exposure and was afebrile at screening at the at the office entrance Past Medical History:  Diagnosis Date  . Atrial fibrillation (Eric [name])   . Blood transfusion without reported diagnosis   . CAD (coronary artery disease)   . CHF (congestive heart failure) (Eric [name])   . GERD (gastroesophageal reflux disease)   . GI bleed   . Hyperlipidemia   . Hypertension   . NSAID-induced gastric ulcer   . Trigeminal neuralgia     Past Surgical History:  Procedure Laterality Date  . CHOLECYSTECTOMY    . ESOPHAGOGASTRODUODENOSCOPY  2011  . FLEXIBLE SIGMOIDOSCOPY N/A 06/04/2013   Procedure: FLEXIBLE SIGMOIDOSCOPY;  Surgeon: Eric [name]., MD;  Location: [facility] ENDOSCOPY;   Service: Endoscopy;  Laterality: N/A;  . HIP SURGERY  August 2008   Right total hip replacement  . SHOULDER SURGERY     Left  . TOTAL KNEE ARTHROPLASTY  June 2009   Left    Current Medications: Current Meds  Medication Sig  . albuterol (PROAIR HFA) 108 (90 BASE) MCG/ACT inhaler Inhale 2 puffs into the lungs every 6 (six) hours as needed for wheezing or shortness of breath.  Marland Kitchen apixaban (ELIQUIS) 5 MG TABS tablet Take 1 tablet (5 mg total) by mouth 2 (two) times daily.  Marland Kitchen aspirin EC 81 MG tablet Take 1 tablet (81 mg total) by mouth daily.  Marland Kitchen atorvastatin (LIPITOR) 40 MG tablet TAKE 1 TABLET AT BEDTIME  . bethanechol (URECHOLINE) 25 MG tablet Take 1 tablet by mouth 2 (two) times daily.  . Bimatoprost (LUMIGAN) 0.01 % SOLN Place 1 drop into both eyes at bedtime. Both eyes at hour of sleep  . bisacodyl (DULCOLAX) 5 MG EC tablet Take 1 tablet by mouth as needed.  . Cholecalciferol (VITAMIN D3) 25 MCG (1000 UT) CAPS Take  1 capsule by mouth daily.  . clorazepate (TRANXENE) 3.75 MG tablet Take 1 tablet (3.75 mg total) by mouth 3 (three) times daily as needed for anxiety.  . cycloSPORINE (RESTASIS) 0.05 % ophthalmic emulsion Place 1 drop into both eyes 2 (two) times daily.   . Difluprednate (DUREZOL) 0.05 % EMUL Place 1 drop into both eyes daily.   Marland Kitchen docusate sodium (COLACE) 100 MG capsule Take 100 mg by mouth 2 (two) times daily.   Marland Kitchen escitalopram (LEXAPRO) 5 MG tablet Take 1 tablet by mouth daily.  . fexofenadine (ALLEGRA) 180 MG tablet Take 180 mg by mouth daily.   . furosemide (LASIX) 20 MG tablet Take 20 mg by mouth daily.  Marland Kitchen gabapentin (NEURONTIN) 100 MG capsule Take 200 mg by mouth 2 (two) times daily.  Marland Kitchen lidocaine (LIDODERM) 5 % 1 patch as directed.  . metoprolol succinate (TOPROL-XL) 25 MG 24 hr tablet Take 12.5 mg by mouth daily.  . multivitamin (THERAGRAN) per tablet Take 1 tablet by mouth daily.   . mupirocin ointment (BACTROBAN) 2 % Apply topically.  . nitroGLYCERIN (NITROSTAT) 0.4  MG SL tablet Place 0.4 mg under the tongue every 5 (five) minutes as needed for chest pain.  Marland Kitchen omeprazole (PRILOSEC) 20 MG capsule Take 20 mg by mouth daily.  . tamsulosin (FLOMAX) 0.4 MG CAPS capsule Take 0.4 mg by mouth daily.   . vitamin C (ASCORBIC ACID) 500 MG tablet Take 1,000 mg by mouth daily.      Allergies:   Enoxaparin sodium [enoxaparin sodium], Codeine, Oxycodone hcl, Simvastatin, Sulfa antibiotics, Sulfamethoxazole, Vicodin [hydrocodone-acetaminophen], Gemfibrozil, Sulfonamide derivatives, and Tramadol   Social History   Socioeconomic History  . Marital status: Married    Spouse name: Not on file  . Number of children: Not on file  . Years of education: Not on file  . Highest education level: Not on file  Occupational History  . Not on file  Tobacco Use  . Smoking status: Former Smoker    Quit date: 09/23/1958    Years since quitting: 60.9  . Smokeless tobacco: Former Systems developer  . Tobacco comment: quit smoking1980-quit chewing 6 months  Substance and Sexual Activity  . Alcohol use: No  . Drug use: No  . Sexual activity: Not on file  Other Topics Concern  . Not on file  Social History Narrative  . Not on file   Social Determinants of Health   Financial Resource Strain:   . Difficulty of Paying Living Expenses: Not on file  Food Insecurity:   . Worried About Charity fundraiser in the Last Year: Not on file  . Ran Out of Food in the Last Year: Not on file  Transportation Needs:   . Lack of Transportation (Medical): Not on file  . Lack of Transportation (Non-Medical): Not on file  Physical Activity:   . Days of Exercise per Week: Not on file  . Minutes of Exercise per Session: Not on file  Stress:   . Feeling of Stress : Not on file  Social Connections:   . Frequency of Communication with Friends and Family: Not on file  . Frequency of Social Gatherings with Friends and Family: Not on file  . Attends Religious Services: Not on file  . Active Member of Clubs or  Organizations: Not on file  . Attends Archivist Meetings: Not on file  . Marital Status: Not on file     Family History: The patient's family history includes Alzheimer's disease in his  mother; Diabetes in his sister; Heart attack in his father. ROS:   Please see the history of present illness.    All other systems reviewed and are negative.  EKGs/Labs/Other Studies Reviewed:    The following studies were reviewed today:   Recent Labs: 12/24/2018: ALT 27; BUN 15; Creatinine, Ser 0.85; Hemoglobin 11.6; Platelets 94; Potassium 4.2; Sodium 134  Recent Lipid Panel    Component Value Date/Time   CHOL 151 09/03/2012 0804   TRIG 126.0 09/03/2012 0804   HDL 40.10 09/03/2012 0804   CHOLHDL 4 09/03/2012 0804   VLDL 25.2 09/03/2012 0804   LDLCALC 86 09/03/2012 0804    Physical Exam:    VS:  BP 140/76   Pulse 86   Ht 6\' 2"  (1.88 m)   Wt 214 lb (97.1 kg)   SpO2 96%   BMI 27.48 kg/m     Wt Readings from Last 3 Encounters:  09/02/19 214 lb (97.1 kg)  08/03/19 199 lb 6.4 oz (90.4 kg)  12/30/18 195 lb 8 oz (88.7 kg)     GEN: Elderly looking man he is somewhat frail he uses a walker in no acute distress HEENT: Normal NECK: No JVD; No carotid bruits LYMPHATICS: No lymphadenopathy CARDIAC irregular variable first heart sound RESPIRATORY: Diminished breath sounds no wheezing ABDOMEN: Soft, non-tender, non-distended MUSCULOSKELETAL: 1-2+ pitting lower extremity it was all good edema; No deformity  SKIN: Warm and dry NEUROLOGIC:  Alert and oriented x 3 PSYCHIATRIC:  Normal affect    Signed, Eric More, MD  09/02/2019 10:52 AM    [place]

## 2019-09-01 NOTE — Telephone Encounter (Signed)
-----   Message from Richardo Priest, MD sent at 08/25/2019  7:30 AM EST ----- Normal or stable result  Please have him see Dre camnitz in [place] re monitor

## 2019-09-02 ENCOUNTER — Encounter: Payer: Self-pay | Admitting: *Deleted

## 2019-09-02 ENCOUNTER — Ambulatory Visit (INDEPENDENT_AMBULATORY_CARE_PROVIDER_SITE_OTHER): Payer: Medicare PPO | Admitting: Cardiology

## 2019-09-02 ENCOUNTER — Encounter: Payer: Self-pay | Admitting: Cardiology

## 2019-09-02 ENCOUNTER — Other Ambulatory Visit: Payer: Self-pay

## 2019-09-02 VITALS — BP 140/76 | HR 86 | Ht 74.0 in | Wt 214.0 lb

## 2019-09-02 DIAGNOSIS — R61 Generalized hyperhidrosis: Secondary | ICD-10-CM

## 2019-09-02 DIAGNOSIS — M7989 Other specified soft tissue disorders: Secondary | ICD-10-CM

## 2019-09-02 DIAGNOSIS — I482 Chronic atrial fibrillation, unspecified: Secondary | ICD-10-CM | POA: Diagnosis not present

## 2019-09-02 DIAGNOSIS — I4729 Other ventricular tachycardia: Secondary | ICD-10-CM

## 2019-09-02 DIAGNOSIS — Z7901 Long term (current) use of anticoagulants: Secondary | ICD-10-CM | POA: Diagnosis not present

## 2019-09-02 DIAGNOSIS — I472 Ventricular tachycardia: Secondary | ICD-10-CM | POA: Diagnosis not present

## 2019-09-02 DIAGNOSIS — I119 Hypertensive heart disease without heart failure: Secondary | ICD-10-CM | POA: Diagnosis not present

## 2019-09-02 NOTE — Patient Instructions (Addendum)
Medication Instructions:  Your physician recommends that you continue on your current medications as directed. Please refer to the Current Medication list given to you today.  *If you need a refill on your cardiac medications before your next appointment, please call your pharmacy*  Lab Work: Your physician recommends that you return for lab work today: CBC, BMP, ProBNP.   If you have labs (blood work) drawn today and your tests are completely normal, you will receive your results only by: Marland Kitchen MyChart Message (if you have MyChart) OR . A paper copy in the mail If you have any lab test that is abnormal or we need to change your treatment, we will call you to review the results.  Testing/Procedures: Your physician has requested that you have an echocardiogram. Echocardiography is a painless test that uses sound waves to create images of your heart. It provides your doctor with information about the size and shape of your heart and how well your heart's chambers and valves are working. This procedure takes approximately one hour. There are no restrictions for this procedure.  Your physician has requested that you have a lexiscan myoview. For further information please visit [email or web address]. Please follow instruction sheet, as given.  Follow-Up: At [facility], you and your health needs are our priority.  As part of our continuing mission to provide you with exceptional heart care, we have created designated Provider Care Teams.  These Care Teams include your primary Cardiologist (physician) and Advanced Practice Providers (APPs -  Physician Assistants and Nurse Practitioners) who all work together to provide you with the care you need, when you need it.  Your next appointment:   3 month(s)  The format for your next appointment:   In Person  Provider:   Shirlee More, MD    **Check your temperature twice daily. Call our office if your temperature is greater than 100.      Echocardiogram An echocardiogram is a procedure that uses painless sound waves (ultrasound) to produce an image of the heart. Images from an echocardiogram can provide important information about:  Signs of coronary artery disease (CAD).  Aneurysm detection. An aneurysm is a weak or damaged part of an artery wall that bulges out from the normal force of blood pumping through the body.  Heart size and shape. Changes in the size or shape of the heart can be associated with certain conditions, including heart failure, aneurysm, and CAD.  Heart muscle function.  Heart valve function.  Signs of a past heart attack.  Fluid buildup around the heart.  Thickening of the heart muscle.  A tumor or infectious growth around the heart valves. Tell a health care provider about:  Any allergies you have.  All medicines you are taking, including vitamins, herbs, eye drops, creams, and over-the-counter medicines.  Any blood disorders you have.  Any surgeries you have had.  Any medical conditions you have.  Whether you are pregnant or may be pregnant. What are the risks? Generally, this is a safe procedure. However, problems may occur, including:  Allergic reaction to dye (contrast) that may be used during the procedure. What happens before the procedure? No specific preparation is needed. You may eat and drink normally. What happens during the procedure?   An IV tube may be inserted into one of your veins.  You may receive contrast through this tube. A contrast is an injection that improves the quality of the pictures from your heart.  A gel will be applied to your  chest.  A wand-like tool (transducer) will be moved over your chest. The gel will help to transmit the sound waves from the transducer.  The sound waves will harmlessly bounce off of your heart to allow the heart images to be captured in real-time motion. The images will be recorded on a computer. The procedure may vary  among health care providers and hospitals. What happens after the procedure?  You may return to your normal, everyday life, including diet, activities, and medicines, unless your health care provider tells you not to do that. Summary  An echocardiogram is a procedure that uses painless sound waves (ultrasound) to produce an image of the heart.  Images from an echocardiogram can provide important information about the size and shape of your heart, heart muscle function, heart valve function, and fluid buildup around your heart.  You do not need to do anything to prepare before this procedure. You may eat and drink normally.  After the echocardiogram is completed, you may return to your normal, everyday life, unless your health care provider tells you not to do that. This information is not intended to replace advice given to you by your health care provider. Make sure you discuss any questions you have with your health care provider. Document Released: 09/06/2000 Document Revised: 12/31/2018 Document Reviewed: 10/12/2016 Elsevier Patient Education  2020 [employer].     Cardiac Nuclear Scan A cardiac nuclear scan is a test that is done to check the flow of blood to your heart. It is done when you are resting and when you are exercising. The test looks for problems such as:  Not enough blood reaching a portion of the heart.  The heart muscle not working as it should. You may need this test if:  You have heart disease.  You have had lab results that are not normal.  You have had heart surgery or a balloon procedure to open up blocked arteries (angioplasty).  You have chest pain.  You have shortness of breath. In this test, a special dye (tracer) is put into your bloodstream. The tracer will travel to your heart. A camera will then take pictures of your heart to see how the tracer moves through your heart. This test is usually done at a hospital and takes 2-4 hours. Tell a doctor  about:  Any allergies you have.  All medicines you are taking, including vitamins, herbs, eye drops, creams, and over-the-counter medicines.  Any problems you or family members have had with anesthetic medicines.  Any blood disorders you have.  Any surgeries you have had.  Any medical conditions you have.  Whether you are pregnant or may be pregnant. What are the risks? Generally, this is a safe test. However, problems may occur, such as:  Serious chest pain and heart attack. This is only a risk if the stress portion of the test is done.  Rapid heartbeat.  A feeling of warmth in your chest. This feeling usually does not last long.  Allergic reaction to the tracer. What happens before the test?  Ask your doctor about changing or stopping your normal medicines. This is important.  Follow instructions from your doctor about what you cannot eat or drink.  Remove your jewelry on the day of the test. What happens during the test?  An IV tube will be inserted into one of your veins.  Your doctor will give you a small amount of tracer through the IV tube.  You will wait for 20-40 minutes  while the tracer moves through your bloodstream.  Your heart will be monitored with an electrocardiogram (ECG).  You will lie down on an exam table.  Pictures of your heart will be taken for about 15-20 minutes.  You may also have a stress test. For this test, one of these things may be done: ? You will be asked to exercise on a treadmill or a stationary bike. ? You will be given medicines that will make your heart work harder. This is done if you are unable to exercise.  When blood flow to your heart has peaked, a tracer will again be given through the IV tube.  After 20-40 minutes, you will get back on the exam table. More pictures will be taken of your heart.  Depending on the tracer that is used, more pictures may need to be taken 3-4 hours later.  Your IV tube will be removed when  the test is over. The test may vary among doctors and hospitals. What happens after the test?  Ask your doctor: ? Whether you can return to your normal schedule, including diet, activities, and medicines. ? Whether you should drink more fluids. This will help to remove the tracer from your body. Drink enough fluid to keep your pee (urine) pale yellow.  Ask your doctor, or the department that is doing the test: ? When will my results be ready? ? How will I get my results? Summary  A cardiac nuclear scan is a test that is done to check the flow of blood to your heart.  Tell your doctor whether you are pregnant or may be pregnant.  Before the test, ask your doctor about changing or stopping your normal medicines. This is important.  Ask your doctor whether you can return to your normal activities. You may be asked to drink more fluids. This information is not intended to replace advice given to you by your health care provider. Make sure you discuss any questions you have with your health care provider. Document Released: 02/23/2018 Document Revised: 12/30/2018 Document Reviewed: 02/23/2018 Elsevier Patient Education  2020 [employer].

## 2019-09-03 LAB — CBC
Hematocrit: 32.9 % — ABNORMAL LOW (ref 37.5–51.0)
Hemoglobin: 10.8 g/dL — ABNORMAL LOW (ref 13.0–17.7)
MCH: 30.9 pg (ref 26.6–33.0)
MCHC: 32.8 g/dL (ref 31.5–35.7)
MCV: 94 fL (ref 79–97)
Platelets: 104 10*3/uL — ABNORMAL LOW (ref 150–450)
RBC: 3.49 x10E6/uL — ABNORMAL LOW (ref 4.14–5.80)
RDW: 14.9 % (ref 11.6–15.4)
WBC: 6.6 10*3/uL (ref 3.4–10.8)

## 2019-09-03 LAB — BASIC METABOLIC PANEL
BUN/Creatinine Ratio: 20 (ref 10–24)
BUN: 17 mg/dL (ref 8–27)
CO2: 27 mmol/L (ref 20–29)
Calcium: 8.2 mg/dL — ABNORMAL LOW (ref 8.6–10.2)
Chloride: 107 mmol/L — ABNORMAL HIGH (ref 96–106)
Creatinine, Ser: 0.87 mg/dL (ref 0.76–1.27)
GFR calc Af Amer: 90 mL/min/{1.73_m2} (ref >59)
GFR calc non Af Amer: 78 mL/min/{1.73_m2} (ref >59)
Glucose: 102 mg/dL — ABNORMAL HIGH (ref 65–99)
Potassium: 4 mmol/L (ref 3.5–5.2)
Sodium: 145 mmol/L — ABNORMAL HIGH (ref 134–144)

## 2019-09-03 LAB — PRO B NATRIURETIC PEPTIDE: NT-Pro BNP: 3001 pg/mL — ABNORMAL HIGH (ref 0–486)

## 2019-09-06 DIAGNOSIS — J3089 Other allergic rhinitis: Secondary | ICD-10-CM

## 2019-09-06 HISTORY — DX: Other allergic rhinitis: J30.89

## 2019-09-20 DIAGNOSIS — H8113 Benign paroxysmal vertigo, bilateral: Secondary | ICD-10-CM | POA: Insufficient documentation

## 2019-09-20 HISTORY — DX: Benign paroxysmal vertigo, bilateral: H81.13

## 2019-09-22 ENCOUNTER — Telehealth: Payer: Self-pay | Admitting: *Deleted

## 2019-09-22 ENCOUNTER — Other Ambulatory Visit: Payer: Self-pay

## 2019-09-22 MED ORDER — APIXABAN 5 MG PO TABS: 5.0000 mg | ORAL_TABLET | Freq: Two times a day (BID) | ORAL | 1 refills | Status: DC

## 2019-09-22 NOTE — Telephone Encounter (Signed)
Attempted to leave a message on voicemail in reference to upcoming appointment scheduled for 09/28/2019 but no answer or voicemail availabe.No my chart available. Mykelle Cockerell, Ranae Palms

## 2019-09-27 ENCOUNTER — Telehealth (HOSPITAL_COMMUNITY): Payer: Self-pay | Admitting: *Deleted

## 2019-09-27 NOTE — Telephone Encounter (Signed)
Patient given detailed instructions per Myocardial Perfusion Study Information Sheet for the test on 09/27/18. Patient notified to arrive 15 minutes early and that it is imperative to arrive on time for appointment to keep from having the test rescheduled.  If you need to cancel or reschedule your appointment, please call the office within 24 hours of your appointment. . Patient verbalized understanding. [name]

## 2019-09-28 ENCOUNTER — Other Ambulatory Visit: Payer: Self-pay

## 2019-09-28 ENCOUNTER — Ambulatory Visit (INDEPENDENT_AMBULATORY_CARE_PROVIDER_SITE_OTHER): Payer: Medicare PPO

## 2019-09-28 VITALS — Ht 74.0 in | Wt 214.0 lb

## 2019-09-28 DIAGNOSIS — R0602 Shortness of breath: Secondary | ICD-10-CM

## 2019-09-28 DIAGNOSIS — I482 Chronic atrial fibrillation, unspecified: Secondary | ICD-10-CM

## 2019-09-28 DIAGNOSIS — Z7901 Long term (current) use of anticoagulants: Secondary | ICD-10-CM | POA: Diagnosis not present

## 2019-09-28 DIAGNOSIS — I2510 Atherosclerotic heart disease of native coronary artery without angina pectoris: Secondary | ICD-10-CM | POA: Diagnosis not present

## 2019-09-28 DIAGNOSIS — I472 Ventricular tachycardia: Secondary | ICD-10-CM

## 2019-09-28 DIAGNOSIS — I119 Hypertensive heart disease without heart failure: Secondary | ICD-10-CM | POA: Diagnosis not present

## 2019-09-28 DIAGNOSIS — M7989 Other specified soft tissue disorders: Secondary | ICD-10-CM

## 2019-09-28 MED ORDER — REGADENOSON 0.4 MG/5ML IV SOLN
0.4000 mg | Freq: Once | INTRAVENOUS | Status: AC
  Administered 2019-09-28: 0.4 mg via INTRAVENOUS

## 2019-09-28 MED ORDER — TECHNETIUM TC 99M TETROFOSMIN IV KIT
10.8000 | PACK | Freq: Once | INTRAVENOUS | Status: AC | PRN
  Administered 2019-09-28: 10.8 via INTRAVENOUS

## 2019-09-28 MED ORDER — TECHNETIUM TC 99M TETROFOSMIN IV KIT
30.9000 | PACK | Freq: Once | INTRAVENOUS | Status: AC | PRN
  Administered 2019-09-28: 30.9 via INTRAVENOUS

## 2019-09-29 ENCOUNTER — Telehealth: Payer: Self-pay | Admitting: Emergency Medicine

## 2019-09-29 LAB — MYOCARDIAL PERFUSION IMAGING
LV dias vol: 87 mL (ref 62–150)
LV sys vol: 28 mL
Peak HR: 73 {beats}/min
Rest HR: 61 {beats}/min
SDS: 0
SRS: 0
SSS: 0
TID: 0.95

## 2019-09-29 NOTE — Telephone Encounter (Signed)
Left message for patient to return cal regarding results.

## 2019-09-30 ENCOUNTER — Other Ambulatory Visit: Payer: Self-pay

## 2019-09-30 MED ORDER — APIXABAN 5 MG PO TABS: 5.0000 mg | ORAL_TABLET | Freq: Two times a day (BID) | ORAL | 3 refills | Status: DC

## 2019-10-01 ENCOUNTER — Telehealth: Payer: Self-pay | Admitting: Cardiology

## 2019-10-01 NOTE — Telephone Encounter (Signed)
States his eliquis needs prior auth called to [employer]

## 2019-10-05 ENCOUNTER — Other Ambulatory Visit: Payer: Self-pay | Admitting: *Deleted

## 2019-10-05 ENCOUNTER — Telehealth: Payer: Self-pay

## 2019-10-05 ENCOUNTER — Telehealth: Payer: Self-pay | Admitting: Cardiology

## 2019-10-05 MED ORDER — APIXABAN 5 MG PO TABS: 5.0000 mg | ORAL_TABLET | Freq: Two times a day (BID) | ORAL | 1 refills | Status: DC

## 2019-10-05 NOTE — Telephone Encounter (Signed)
[facility] called to verify if patient is supposed to be on two anticoag's. Patient is currently taking Eliquis 5 mg BID but also has a script for coumadin 5 mg ready for pickup (rx by [facility]). RN spoke with patient and verified that he is not taking coumadin. Information relayed back to [facility]. Patient is in donut hole and can not afford refill. Samples pulled along with pt assistance paperwork for patient pickup.

## 2019-10-05 NOTE — Telephone Encounter (Signed)
Refill sent.

## 2019-10-05 NOTE — Telephone Encounter (Signed)
Please recall Eliquis to the [facility] on [address], they state they do not have script. HE IS OUT

## 2019-10-10 NOTE — Progress Notes (Signed)
Electrophysiology Office Note   Date:  10/11/2019   ID:  Eric [name], DOB 23-Aug-1931, MRN [id number]  PCP:  Raina Mina., MD  Cardiologist:  Bettina Gavia Primary Electrophysiologist:  Ewart Carrera Meredith Leeds, MD    Chief Complaint: AF   History of Present Illness: Eric [name] is a 86 y.o. male who is being seen today for the evaluation of AF, VT at the request of Bettina Gavia Hilton Cork, MD. Presenting today for electrophysiology evaluation.  He has a history of permanent atrial fibrillation, hypertension, COPD, and TIA.  He wore a cardiac monitor which showed episodes of nonsustained VT. he currently feels well without issue.  He is unaware of any arrhythmia.  He says that he has been in atrial fibrillation for many many years.  Today, he denies symptoms of palpitations, chest pain, shortness of breath, orthopnea, PND, lower extremity edema, claudication, dizziness, presyncope, syncope, bleeding, or neurologic sequela. The patient is tolerating medications without difficulties.    Past Medical History:  Diagnosis Date  . Atrial fibrillation ([place])   . Blood transfusion without reported diagnosis   . CAD (coronary artery disease)   . CHF (congestive heart failure) ([place])   . GERD (gastroesophageal reflux disease)   . GI bleed   . Hyperlipidemia   . Hypertension   . NSAID-induced gastric ulcer   . Trigeminal neuralgia    Past Surgical History:  Procedure Laterality Date  . CHOLECYSTECTOMY    . ESOPHAGOGASTRODUODENOSCOPY  2011  . FLEXIBLE SIGMOIDOSCOPY N/A 06/04/2013   Procedure: FLEXIBLE SIGMOIDOSCOPY;  Surgeon: Winfield Cunas., MD;  Location: [facility] ENDOSCOPY;  Service: Endoscopy;  Laterality: N/A;  . HIP SURGERY  August 2008   Right total hip replacement  . SHOULDER SURGERY     Left  . TOTAL KNEE ARTHROPLASTY  June 2009   Left     Current Outpatient Medications  Medication Sig Dispense Refill  . albuterol (PROAIR HFA) 108 (90 BASE) MCG/ACT inhaler Inhale 2 puffs into  the lungs every 6 (six) hours as needed for wheezing or shortness of breath. 1 Inhaler 5  . apixaban (ELIQUIS) 5 MG TABS tablet Take 1 tablet (5 mg total) by mouth 2 (two) times daily. 180 tablet 1  . aspirin EC 81 MG tablet Take 1 tablet (81 mg total) by mouth daily. 90 tablet 3  . atorvastatin (LIPITOR) 40 MG tablet TAKE 1 TABLET AT BEDTIME 90 tablet 1  . bethanechol (URECHOLINE) 25 MG tablet Take 1 tablet by mouth 2 (two) times daily.    . Bimatoprost (LUMIGAN) 0.01 % SOLN Place 1 drop into both eyes at bedtime. Both eyes at hour of sleep    . bisacodyl (DULCOLAX) 5 MG EC tablet Take 1 tablet by mouth as needed.    . Cholecalciferol (VITAMIN D3) 25 MCG (1000 UT) CAPS Take 1 capsule by mouth daily.    . clorazepate (TRANXENE) 3.75 MG tablet Take 1 tablet (3.75 mg total) by mouth 3 (three) times daily as needed for anxiety. 90 tablet 0  . cycloSPORINE (RESTASIS) 0.05 % ophthalmic emulsion Place 1 drop into both eyes 2 (two) times daily.     . Difluprednate (DUREZOL) 0.05 % EMUL Place 1 drop into both eyes daily.     Marland Kitchen docusate sodium (COLACE) 100 MG capsule Take 100 mg by mouth 2 (two) times daily.     Marland Kitchen escitalopram (LEXAPRO) 5 MG tablet Take 1 tablet by mouth daily.  5  . fexofenadine (ALLEGRA) 180 MG tablet Take 180  mg by mouth daily.     . furosemide (LASIX) 20 MG tablet Take 20 mg by mouth daily.    Marland Kitchen gabapentin (NEURONTIN) 100 MG capsule Take 200 mg by mouth 2 (two) times daily.    Marland Kitchen lidocaine (LIDODERM) 5 % 1 patch as directed.    . metoprolol succinate (TOPROL-XL) 25 MG 24 hr tablet Take 12.5 mg by mouth daily.    . multivitamin (THERAGRAN) per tablet Take 1 tablet by mouth daily.     . mupirocin ointment (BACTROBAN) 2 % Apply topically.    . nitroGLYCERIN (NITROSTAT) 0.4 MG SL tablet Place 0.4 mg under the tongue every 5 (five) minutes as needed for chest pain.    Marland Kitchen omeprazole (PRILOSEC) 20 MG capsule Take 20 mg by mouth daily.    . tamsulosin (FLOMAX) 0.4 MG CAPS capsule Take 0.4 mg  by mouth daily.     . vitamin C (ASCORBIC ACID) 500 MG tablet Take 1,000 mg by mouth daily.      No current facility-administered medications for this visit.    Allergies:   Enoxaparin sodium [enoxaparin sodium], Codeine, Oxycodone hcl, Simvastatin, Sulfa antibiotics, Sulfamethoxazole, Vicodin [hydrocodone-acetaminophen], Gemfibrozil, Sulfonamide derivatives, and Tramadol   Social History:  The patient  reports that he quit smoking about 61 years ago. He has quit using smokeless tobacco. He reports that he does not drink alcohol or use drugs.   Family History:  The patient's family history includes Alzheimer's disease in his mother; Diabetes in his sister; Heart attack in his father.    ROS:  Please see the history of present illness.   Otherwise, review of systems is positive for none.   All other systems are reviewed and negative.    PHYSICAL EXAM: VS:  BP 134/68   Pulse 74   Ht 6\' 2"  (1.88 m)   Wt 219 lb 9.6 oz (99.6 kg)   SpO2 97%   BMI 28.19 kg/m  , BMI Body mass index is 28.19 kg/m. GEN: Well nourished, well developed, in no acute distress  HEENT: normal  Neck: no JVD, carotid bruits, or masses Cardiac: irregular; no murmurs, rubs, or gallops,no edema  Respiratory:  clear to auscultation bilaterally, normal work of breathing GI: soft, nontender, nondistended, + BS MS: no deformity or atrophy  Skin: warm and dry Neuro:  Strength and sensation are intact Psych: euthymic mood, full affect  EKG:  EKG is ordered today. Personal review of the ekg ordered shows atrial fibrillation, rate 74   Recent Labs: 12/24/2018: ALT 27 09/02/2019: BUN 17; Creatinine, Ser 0.87; Hemoglobin 10.8; NT-Pro BNP 3,001; Platelets 104; Potassium 4.0; Sodium 145    Lipid Panel     Component Value Date/Time   CHOL 151 09/03/2012 0804   TRIG 126.0 09/03/2012 0804   HDL 40.10 09/03/2012 0804   CHOLHDL 4 09/03/2012 0804   VLDL 25.2 09/03/2012 0804   LDLCALC 86 09/03/2012 0804     Wt  Readings from Last 3 Encounters:  10/11/19 219 lb 9.6 oz (99.6 kg)  09/28/19 214 lb (97.1 kg)  09/02/19 214 lb (97.1 kg)      Other studies Reviewed: Additional studies/ records that were reviewed today include: SPECT 09/28/18  Review of the above records today demonstrates:   Nuclear stress EF: 67%.  There was no ST segment deviation noted during stress.  The study is normal.  This is a low risk study.  The left ventricular ejection fraction is normal (55-65%).  Monitor 08/24/19 personally reviewed The rhythm throughout  was continuously atrial fibrillation with minimum average and maximum heart rates of 39 66, and 76 bpm.  Both day and nighttime heart rate distributions were 99% in range 50 to 110 bpm.  There is a single pause of greater than 3 seconds 3.2 seconds noted.  There is one triggered event associated with atrial fibrillation.  There were no diary events.  Ventricular ectopy was rare with PVCs and couplets.  There were 4 brief episodes of nonsustained ventricular tachycardia the longest 7 complexes at a rate of 124 bpm.  There was an instance at 08/10/2019 4:52 AM where there were 3 episodes of nonsustained ventricular tachycardia interspersed between sent a single conducted complex of atrial fibrillation.  This was asymptomatic.  ASSESSMENT AND PLAN:  1.  Permanent atrial fibrillation: Currently on Eliquis.  CHA2DS2-VASc of at least 5.  2.  Nonsustained VT: Had 3 episodes of nonsustained VT in a row interspersed with a single conducted beat in atrial fibrillation.  This was all nocturnal.  He had a Myoview that fortunately showed no evidence of coronary artery disease and a normal ejection fraction.  He has an echo pending.  If his echo returns normal without low ejection fraction, would continue with current management and Charelle Petrakis Toprol-XL to 25 mg.  3.  Hypertension: Currently well controlled  Case discussed with referring cardiologist  Current medicines are  reviewed at length with the patient today.   The patient does not have concerns regarding his medicines.  The following changes were made today: Increase Toprol-XL  Labs/ tests ordered today include:  Orders Placed This Encounter  Procedures  . EKG 12-Lead  . EKG 12-Lead     Disposition:   FU with Bronda Alfred as needed  Signed, Dorien Mayotte Meredith Leeds, MD  10/11/2019 12:03 PM     [place] [address] [place] [address] [phone] (office) [phone] (fax)

## 2019-10-11 ENCOUNTER — Encounter: Payer: Self-pay | Admitting: Cardiology

## 2019-10-11 ENCOUNTER — Other Ambulatory Visit: Payer: Self-pay

## 2019-10-11 ENCOUNTER — Ambulatory Visit (INDEPENDENT_AMBULATORY_CARE_PROVIDER_SITE_OTHER): Payer: Medicare PPO | Admitting: Cardiology

## 2019-10-11 VITALS — BP 134/68 | HR 74 | Ht 74.0 in | Wt 219.6 lb

## 2019-10-11 DIAGNOSIS — I472 Ventricular tachycardia, unspecified: Secondary | ICD-10-CM

## 2019-10-11 MED ORDER — METOPROLOL SUCCINATE ER 25 MG PO TB24: 25.0000 mg | ORAL_TABLET | Freq: Every day | ORAL | 1 refills | Status: DC

## 2019-10-11 NOTE — Patient Instructions (Addendum)
Medication Instructions:  Your physician has recommended you make the following change in your medication:  1. INCREASE Metoprolol (Toprol) to 25 mg once daily  * If you need a refill on your cardiac medications before your next appointment, please call your pharmacy.   Labwork: None ordered  Testing/Procedures: None ordered  Follow-Up: No follow up is needed at this time with Dr. Curt Bears.  He will see you on an as needed basis.   Thank you for choosing [facility]!!   Trinidad Curet, RN [phone]

## 2019-10-14 ENCOUNTER — Ambulatory Visit: Payer: Medicare PPO | Admitting: Sports Medicine

## 2019-10-15 ENCOUNTER — Ambulatory Visit: Payer: Medicare PPO | Admitting: Sports Medicine

## 2019-10-15 ENCOUNTER — Encounter: Payer: Self-pay | Admitting: Sports Medicine

## 2019-10-15 ENCOUNTER — Other Ambulatory Visit: Payer: Self-pay

## 2019-10-15 DIAGNOSIS — I739 Peripheral vascular disease, unspecified: Secondary | ICD-10-CM | POA: Diagnosis not present

## 2019-10-15 DIAGNOSIS — B351 Tinea unguium: Secondary | ICD-10-CM | POA: Diagnosis not present

## 2019-10-15 DIAGNOSIS — D689 Coagulation defect, unspecified: Secondary | ICD-10-CM

## 2019-10-15 DIAGNOSIS — M79676 Pain in unspecified toe(s): Secondary | ICD-10-CM

## 2019-10-15 NOTE — Progress Notes (Signed)
Subjective: Eric [name] is a 86 y.o. male patient seen today in office with complaint of painful thickened and elongated toenails; unable to trim. Patient denies any changes with medications or medical history, NOT DIABETIC but is on Eliquis.  Patient has no other pedal complaints at this time.   Patient Active Problem List   Diagnosis Date Noted  . Benign paroxysmal positional vertigo due to bilateral vestibular disorder 09/20/2019  . Non-seasonal allergic rhinitis 09/06/2019  . Herpes zoster without complication 99991111  . Moderate major depression ([place]) 03/15/2019  . Postherpetic neuralgia 02/04/2019  . Thrombocytopenia ([place]) 12/25/2018  . Chronic anticoagulation 06/24/2018  . Type 2 diabetes mellitus with stage 3 chronic kidney disease, without long-term current use of insulin ([place]) 12/10/2017  . Abnormal liver enzymes 11/21/2017  . CKD (chronic kidney disease) stage 3, GFR 30-59 ml/min 11/21/2017  . Anemia of unknown etiology 11/17/2017  . C. difficile colitis 11/17/2017  . Unexplained weight loss 11/17/2017  . Hemoptysis 11/17/2017  . SOB (shortness of breath) 12/10/2016  . Multiple pulmonary nodules 11/12/2016  . Chronic obstructive pulmonary disease ([place]) 07/01/2016  . Mixed hyperlipidemia 07/01/2016  . Status post total left knee replacement 09/14/2015  . Elevated PSA 12/07/2012  . Chest pain in adult 07/28/2011  . HYPOTENSION, ORTHOSTATIC 05/15/2010  . Congestive heart failure ([place]) 05/11/2010  . CHEST PAIN UNSPECIFIED 03/20/2010  . Coronary artery disease involving native coronary artery of native heart without angina pectoris 10/23/2007  . Chronic atrial fibrillation 07/23/2007  . Hypertensive heart disease 07/21/2007  . GERD 07/21/2007  . CEREBROVASCULAR ACCIDENT, HX OF 07/21/2007  . Essential hypertension 07/21/2007    Current Outpatient Medications on File Prior to Visit  Medication Sig Dispense Refill  . albuterol (PROAIR HFA) 108 (90 BASE) MCG/ACT  inhaler Inhale 2 puffs into the lungs every 6 (six) hours as needed for wheezing or shortness of breath. 1 Inhaler 5  . apixaban (ELIQUIS) 5 MG TABS tablet Take 1 tablet (5 mg total) by mouth 2 (two) times daily. 180 tablet 1  . aspirin EC 81 MG tablet Take 1 tablet (81 mg total) by mouth daily. 90 tablet 3  . atorvastatin (LIPITOR) 40 MG tablet TAKE 1 TABLET AT BEDTIME 90 tablet 1  . bethanechol (URECHOLINE) 25 MG tablet Take 1 tablet by mouth 2 (two) times daily.    . Bimatoprost (LUMIGAN) 0.01 % SOLN Place 1 drop into both eyes at bedtime. Both eyes at hour of sleep    . bisacodyl (DULCOLAX) 5 MG EC tablet Take 1 tablet by mouth as needed.    . Cholecalciferol (VITAMIN D3) 25 MCG (1000 UT) CAPS Take 1 capsule by mouth daily.    . clorazepate (TRANXENE) 3.75 MG tablet Take 1 tablet (3.75 mg total) by mouth 3 (three) times daily as needed for anxiety. 90 tablet 0  . cycloSPORINE (RESTASIS) 0.05 % ophthalmic emulsion Place 1 drop into both eyes 2 (two) times daily.     . Difluprednate (DUREZOL) 0.05 % EMUL Place 1 drop into both eyes daily.     Marland Kitchen docusate sodium (COLACE) 100 MG capsule Take 100 mg by mouth 2 (two) times daily.     Marland Kitchen escitalopram (LEXAPRO) 5 MG tablet Take 1 tablet by mouth daily.  5  . fexofenadine (ALLEGRA) 180 MG tablet Take 180 mg by mouth daily.     . furosemide (LASIX) 20 MG tablet Take 20 mg by mouth daily.    Marland Kitchen gabapentin (NEURONTIN) 100 MG capsule Take 200 mg by  mouth 2 (two) times daily.    Marland Kitchen lidocaine (LIDODERM) 5 % 1 patch as directed.    . metoprolol succinate (TOPROL-XL) 25 MG 24 hr tablet Take 1 tablet (25 mg total) by mouth daily. Take with or immediately following a meal. 90 tablet 1  . multivitamin (THERAGRAN) per tablet Take 1 tablet by mouth daily.     . mupirocin ointment (BACTROBAN) 2 % Apply topically.    . nitroGLYCERIN (NITROSTAT) 0.4 MG SL tablet Place 0.4 mg under the tongue every 5 (five) minutes as needed for chest pain.    Marland Kitchen omeprazole (PRILOSEC) 20  MG capsule Take 20 mg by mouth daily.    . tamsulosin (FLOMAX) 0.4 MG CAPS capsule Take 0.4 mg by mouth daily.     . vitamin C (ASCORBIC ACID) 500 MG tablet Take 1,000 mg by mouth daily.      No current facility-administered medications on file prior to visit.    Allergies  Allergen Reactions  . Enoxaparin Sodium [Enoxaparin Sodium] Anaphylaxis and Other (See Comments)    angioedema  . Codeine Other (See Comments)    hallucinations hallucinations  . Oxycodone Hcl Other (See Comments)    hallucinations  . Simvastatin Other (See Comments)  . Sulfa Antibiotics Other (See Comments)    Unknown.  . Sulfamethoxazole Other (See Comments)    Unknown.  . Vicodin [Hydrocodone-Acetaminophen] Other (See Comments)    hallucinations  . Gemfibrozil Rash  . Sulfonamide Derivatives Rash  . Tramadol Rash    Objective: Physical Exam  General: Well developed, nourished, no acute distress, awake, alert and oriented x 3  Vascular: Dorsalis pedis artery 1/4 bilateral, Posterior tibial artery 1/4 bilateral, skin temperature warm to warm proximal to distal bilateral lower extremities, + varicosities, scant pedal hair present bilateral.  Neurological: Gross sensation present via light touch bilateral.   Dermatological: Skin is warm, dry, and supple bilateral, Nails 1-10 are tender, long, thick, and discolored with mild subungal debris, no webspace macerations present bilateral, no open lesions present bilateral, no callus/corns/hyperkeratotic tissue present bilateral. No signs of infection bilateral.  Musculoskeletal: No symptomatic boney deformities noted bilateral. Muscular strength within normal limits without painon range of motion. No pain with calf compression bilateral.  Assessment and Plan:  Problem List Items Addressed This Visit    None    Visit Diagnoses    Pain due to onychomycosis of toenail    -  Primary   Coagulation defect ([place])       PVD (peripheral vascular disease) ([place])           -Examined patient.  -Discussed treatment options for painful mycotic nails. -Mechanically debrided and reduced mycotic nails with sterile nail nipper and dremel nail file without incident. -Patient to return in 3 months for follow up evaluation or sooner if symptoms worsen.  Landis Martins, DPM

## 2019-10-25 ENCOUNTER — Telehealth: Payer: Self-pay | Admitting: Cardiology

## 2019-10-25 NOTE — Telephone Encounter (Signed)
Pt c/o medication issue:  1. Name of Medication: isosorbide mononitrate (IMDUR) 30 mg  2. How are you currently taking this medication (dosage and times per day)?   3. Are you having a reaction (difficulty breathing--STAT)?   4. What is your medication issue? Patient can not remember why Dr. Curt Bears took him off of this medication. He would like the nurse to let him know what he should or should not be taking

## 2019-10-25 NOTE — Telephone Encounter (Signed)
Chart reviewed and this was stopped on 12/28/18 due to possible rash. I spoke with patient and gave him this information. He reports he has not been taking Imdur.  He recently found an old bottle and wanted to check to see if he should take this.   I told patient he should not be taking Imdur.

## 2019-10-28 ENCOUNTER — Other Ambulatory Visit: Payer: Medicare PPO

## 2019-11-29 ENCOUNTER — Telehealth: Payer: Self-pay | Admitting: Cardiology

## 2019-11-29 MED ORDER — FUROSEMIDE 40 MG PO TABS: 40.0000 mg | ORAL_TABLET | Freq: Every day | ORAL | 2 refills | Status: DC

## 2019-11-29 NOTE — Telephone Encounter (Signed)
I spoke with patient. His weight is 222 lbs today. Previous weights--3/7-221 lbs                                3/5-219 lbs                                3/4- 218 lbs. He does not know other weights.  He weighs daily but does not keep record of weights.  I asked him to write down his weights daily.  He thinks he weighed around 206 lbs in January.  Not sure when his weight increased. At 1/18 office visit with Dr Curt Bears weight was 219 lbs.  At 12/10 visit with Dr Bettina Gavia weight was 214 lbs. Patient states swelling in legs started to increase last week.  Also noted large bruise near left knee.  Knee is swollen and painful. He saw primary care and was instructed to get stockings to wear.  Patient is not having any shortness of breath.  Some wheezing and has contacted primary care today regarding this. No fever, chills, aches, sore throat. He is taking lasix 20 mg daily. Pt is scheduled for echo on 3/10 and office visit on 3/17 with Dr Bettina Gavia. I advised him to keep these appointments. I asked him to try and keep legs elevated as much as possible.  I told him I would send message to Dr Bettina Gavia and we would call him back if any changes needed.

## 2019-11-29 NOTE — Telephone Encounter (Signed)
Please ask him in the interim to increase his diuretic to Lasix 40 mg once daily

## 2019-11-29 NOTE — Telephone Encounter (Signed)
Pt c/o swelling: STAT is pt has developed SOB within 24 hours  1) How much weight have you gained and in what time span? 16 pounds since his last OV with Dr. Curt Bears on 10/11/19  2) If swelling, where is the swelling located? legs  3) Are you currently taking a fluid pill? yes  4) Are you currently SOB? no  5) Do you have a log of your daily weights (if so, list)? no  6) Have you gained 3 pounds in a day or 5 pounds in a week? maybe  7) Have you traveled recently? No  Patient initially called in because he states that he has some bruising. He has a really big bruise on his knee. He denies falling. He denies chest pain, headaches, sob. He does say that he feel dizzy at times and did mention that he had some swelling. He states that he was weighing 206-207 at his last OV on 10/11/19 but now weighs 222

## 2019-11-29 NOTE — Telephone Encounter (Signed)
I spoke with patient and gave him information from Dr Bettina Gavia.  Will send prescription for increased dose of furosemide to [facility]

## 2019-12-01 ENCOUNTER — Ambulatory Visit (INDEPENDENT_AMBULATORY_CARE_PROVIDER_SITE_OTHER): Payer: Medicare PPO

## 2019-12-01 ENCOUNTER — Other Ambulatory Visit: Payer: Self-pay

## 2019-12-01 DIAGNOSIS — M7989 Other specified soft tissue disorders: Secondary | ICD-10-CM | POA: Diagnosis not present

## 2019-12-01 DIAGNOSIS — I472 Ventricular tachycardia: Secondary | ICD-10-CM

## 2019-12-01 DIAGNOSIS — I482 Chronic atrial fibrillation, unspecified: Secondary | ICD-10-CM

## 2019-12-01 DIAGNOSIS — I119 Hypertensive heart disease without heart failure: Secondary | ICD-10-CM

## 2019-12-01 NOTE — Progress Notes (Unsigned)
Complete echocardiogram has been performed.  Jimmy Sameena Artus RDCS, RVT 

## 2019-12-02 ENCOUNTER — Telehealth: Payer: Self-pay

## 2019-12-02 NOTE — Telephone Encounter (Signed)
The patient has been notified of the Echo result and verbalized understanding.  All questions (if any) were answered. Frederik Schmidt, RN 12/02/2019 8:59 AM

## 2019-12-02 NOTE — Telephone Encounter (Signed)
-----   Message from Richardo Priest, MD sent at 12/01/2019  5:21 PM EST ----- Normal or stable result  No changes

## 2019-12-07 NOTE — Progress Notes (Signed)
Cardiology Office Note:    Date:  12/08/2019   ID:  Eric [name], DOB [birth year], MRN [id number]  PCP:  Raina Mina., MD  Cardiologist:  Shirlee More, MD    Referring MD: Raina Mina., MD    ASSESSMENT:    1. Chronic atrial fibrillation ([place])   2. Hypertensive heart disease with chronic diastolic congestive heart failure ([place])   3. Chronic anticoagulation    PLAN:    In order of problems listed above:  1. Stable rate controlled continue beta-blocker and direct anticoagulant 2. Improved he has had a good diuresis minimal edema and continue his current diuretic recheck renal function proBNP 3. Continue his current anticoagulant 4. Last lipid profile October 2020 recheck today   Next appointment: 3 months for heart failure   Medication Adjustments/Labs and Tests Ordered: Current medicines are reviewed at length with the patient today.  Concerns regarding medicines are outlined above.  No orders of the defined types were placed in this encounter.  No orders of the defined types were placed in this encounter.   Chief Complaint  Patient presents with  . Follow-up  . Congestive Heart Failure    History of Present Illness:    Eric [name] is a 86 y.o. male with a hx of chronic AF on warfarin, non anginal chest pain, hypertension and COPD  last seen 08/03/2019 after TIA with bilateral occipital visual loss with inadequate INR. He was transitioned to direct anticoagulant and a subsequent ambulatory heart rhythm monitor showed nonsustained ventricular tachycardia.  I have discussed the case verbally with the EP at 1 seen in consultation and I am concerned that he requires antiarrhythmic drug therapy.  He has a history of remote normal coronary angiography from record review in care everywhere.  2015 myocardial perfusion study at [facility] showed mild inferior lateral ischemia EF 52%.  EP did not advise antiarrhythmic drug therapy. He was last seen  09/01/2020 with increasing edema. Compliance with diet, lifestyle and medications: Yes  At his home his weight is down 10 pounds or greater his legs are worse edematous breathing is improved and he is pleased with the quality of his life.  He has had no chest pain palpitation or syncope and tolerates his coagulant without bleeding. Past Medical History:  Diagnosis Date  . Abnormal liver enzymes 11/21/2017  . Anemia of unknown etiology 11/17/2017  . Atrial fibrillation ([place])   . Benign paroxysmal positional vertigo due to bilateral vestibular disorder 09/20/2019  . Blood transfusion without reported diagnosis   . C. difficile colitis 11/17/2017  . CAD (coronary artery disease)   . CEREBROVASCULAR ACCIDENT, HX OF 07/21/2007   Qualifier: Diagnosis of  By: Rogue Bussing CMA, Maryann Alar    . Chest pain in adult 07/28/2011  . CHEST PAIN UNSPECIFIED 03/20/2010   Qualifier: Diagnosis of  By: Burnice Logan  MD, Doretha Sou   . CHF (congestive heart failure) ([place])   . Chronic anticoagulation 06/24/2018  . Chronic atrial fibrillation 07/23/2007   Qualifier: Diagnosis of  By: Burnice Logan  MD, Doretha Sou   . Chronic obstructive pulmonary disease ([place]) 07/01/2016  . CKD (chronic kidney disease) stage 3, GFR 30-59 ml/min 11/21/2017  . Congestive heart failure ([place]) 05/11/2010   Qualifier: Diagnosis of  By: Burnice Logan  MD, Doretha Sou   . Coronary artery disease involving native coronary artery of native heart without angina pectoris 10/23/2007     - Left ventricle: The cavity size was normal. Systolic function was  normal. The estimated ejection fraction was in the range of 55% to     60%. Wall motion was normal; there were no regional wall motion     abnormalities.   - Left atrium: The atrium was mildly to moderately dilated.   - Right ventricle: The cavity size was mildly dilated.   - Right atrium: The atrium was mildly to moderately  . Elevated PSA 12/07/2012   Noted December 2013. Discussed with patient who does not desire  further evaluation   . Essential hypertension 07/21/2007   Overview:  Overview:  Qualifier: Diagnosis of  By: Rogue Bussing CMA, Jacqualynn   Qualifier: Diagnosis of  By: Sherren Mocha MD, Jory Ee GERD 07/21/2007   Qualifier: Diagnosis of  By: Rogue Bussing CMA, Maryann Alar   Qualifier: Diagnosis of  By: Sherren Mocha MD, Jory Ee GERD (gastroesophageal reflux disease)   . GI bleed   . Hemoptysis 11/17/2017  . Herpes zoster without complication [id number]  . Hyperlipidemia   . Hypertension   . Hypertensive heart disease 07/21/2007   Qualifier: Diagnosis of  By: Rogue Bussing CMA, Maryann Alar   Qualifier: Diagnosis of  By: Sherren Mocha MD, Jory Ee HYPOTENSION, ORTHOSTATIC 05/15/2010   Qualifier: Diagnosis of  By: Burnice Logan  MD, Doretha Sou   . Mixed hyperlipidemia 07/01/2016  . Moderate major depression ([place]) 03/15/2019  . Multiple pulmonary nodules 11/12/2016   Overview:  Seen on ct of chest 11/15/16. 50mm nodules in right major fissure and 42mm at minor fissure right lung.  25mm  nodule at left major fissure.   . Non-seasonal allergic rhinitis 09/06/2019  . NSAID-induced gastric ulcer   . Postherpetic neuralgia 02/04/2019  . SOB (shortness of breath) 12/10/2016  . Status post total left knee replacement 09/14/2015  . Thrombocytopenia ([place]) 12/25/2018  . Trigeminal neuralgia   . Type 2 diabetes mellitus with stage 3 chronic kidney disease, without long-term current use of insulin ([place]) 12/10/2017  . Unexplained weight loss 11/17/2017    Past Surgical History:  Procedure Laterality Date  . CHOLECYSTECTOMY    . ESOPHAGOGASTRODUODENOSCOPY  2011  . FLEXIBLE SIGMOIDOSCOPY N/A 06/04/2013   Procedure: FLEXIBLE SIGMOIDOSCOPY;  Surgeon: Winfield Cunas., MD;  Location: [facility] ENDOSCOPY;  Service: Endoscopy;  Laterality: N/A;  . HIP SURGERY  August 2008   Right total hip replacement  . SHOULDER SURGERY     Left  . TOTAL KNEE ARTHROPLASTY  June 2009   Left    Current Medications: Current Meds  Medication Sig  . albuterol  (PROAIR HFA) 108 (90 BASE) MCG/ACT inhaler Inhale 2 puffs into the lungs every 6 (six) hours as needed for wheezing or shortness of breath.  Marland Kitchen apixaban (ELIQUIS) 5 MG TABS tablet Take 1 tablet (5 mg total) by mouth 2 (two) times daily.  Marland Kitchen aspirin EC 81 MG tablet Take 1 tablet (81 mg total) by mouth daily.  Marland Kitchen atorvastatin (LIPITOR) 40 MG tablet TAKE 1 TABLET AT BEDTIME  . azithromycin (ZITHROMAX) 250 MG tablet   . bethanechol (URECHOLINE) 25 MG tablet Take 1 tablet by mouth 2 (two) times daily.  . Bimatoprost (LUMIGAN) 0.01 % SOLN Place 1 drop into both eyes at bedtime. Both eyes at hour of sleep  . bisacodyl (DULCOLAX) 5 MG EC tablet Take 1 tablet by mouth as needed.  . brimonidine (ALPHAGAN) 0.2 % ophthalmic solution INSTILL ONE DROP IN BOTH EYES TWICE A DAY  . Cholecalciferol (VITAMIN D3) 25 MCG (1000 UT) CAPS Take 1 capsule by mouth daily.  Marland Kitchen  clorazepate (TRANXENE) 3.75 MG tablet Take 1 tablet (3.75 mg total) by mouth 3 (three) times daily as needed for anxiety.  . cycloSPORINE (RESTASIS) 0.05 % ophthalmic emulsion Place 1 drop into both eyes 2 (two) times daily.   . Difluprednate (DUREZOL) 0.05 % EMUL Place 1 drop into both eyes daily.   Marland Kitchen docusate sodium (COLACE) 100 MG capsule Take 100 mg by mouth 2 (two) times daily.   Marland Kitchen escitalopram (LEXAPRO) 5 MG tablet Take 1 tablet by mouth daily.  . fexofenadine (ALLEGRA) 180 MG tablet Take 180 mg by mouth daily.   . furosemide (LASIX) 40 MG tablet Take 1 tablet (40 mg total) by mouth daily.  Marland Kitchen gabapentin (NEURONTIN) 100 MG capsule Take 200 mg by mouth 2 (two) times daily.  Marland Kitchen lidocaine (LIDODERM) 5 % 1 patch as directed.  . metoprolol succinate (TOPROL-XL) 25 MG 24 hr tablet Take 1 tablet (25 mg total) by mouth daily. Take with or immediately following a meal.  . multivitamin (THERAGRAN) per tablet Take 1 tablet by mouth daily.   . mupirocin ointment (BACTROBAN) 2 % Apply topically.  . nitroGLYCERIN (NITROSTAT) 0.4 MG SL tablet Place 0.4 mg under  the tongue every 5 (five) minutes as needed for chest pain.  Marland Kitchen omeprazole (PRILOSEC) 20 MG capsule Take 20 mg by mouth daily.  . tamsulosin (FLOMAX) 0.4 MG CAPS capsule Take 0.4 mg by mouth daily.   . vitamin C (ASCORBIC ACID) 500 MG tablet Take 1,000 mg by mouth daily.      Allergies:   Enoxaparin sodium [enoxaparin sodium], Codeine, Oxycodone hcl, Simvastatin, Sulfa antibiotics, Sulfamethoxazole, Vicodin [hydrocodone-acetaminophen], Gemfibrozil, Sulfonamide derivatives, and Tramadol   Social History   Socioeconomic History  . Marital status: Married    Spouse name: Not on file  . Number of children: Not on file  . Years of education: Not on file  . Highest education level: Not on file  Occupational History  . Not on file  Tobacco Use  . Smoking status: Former Smoker    Quit date: 09/23/1958    Years since quitting: 61.2  . Smokeless tobacco: Former Systems developer  . Tobacco comment: quit smoking1980-quit chewing 6 months  Substance and Sexual Activity  . Alcohol use: No  . Drug use: No  . Sexual activity: Not on file  Other Topics Concern  . Not on file  Social History Narrative  . Not on file   Social Determinants of Health   Financial Resource Strain:   . Difficulty of Paying Living Expenses:   Food Insecurity:   . Worried About Charity fundraiser in the Last Year:   . Arboriculturist in the Last Year:   Transportation Needs:   . Film/video editor (Medical):   Marland Kitchen Lack of Transportation (Non-Medical):   Physical Activity:   . Days of Exercise per Week:   . Minutes of Exercise per Session:   Stress:   . Feeling of Stress :   Social Connections:   . Frequency of Communication with Friends and Family:   . Frequency of Social Gatherings with Friends and Family:   . Attends Religious Services:   . Active Member of Clubs or Organizations:   . Attends Archivist Meetings:   Marland Kitchen Marital Status:      Family History: The patient's family history includes  Alzheimer's disease in his mother; Diabetes in his sister; Heart attack in his father. ROS:   Please see the history of present illness.  All other systems reviewed and are negative.  EKGs/Labs/Other Studies Reviewed:    The following studies were reviewed today:    Recent Labs: 12/24/2018: ALT 27 09/02/2019: BUN 17; Creatinine, Ser 0.87; Hemoglobin 10.8; NT-Pro BNP 3,001; Platelets 104; Potassium 4.0; Sodium 145  Recent Lipid Panel    Component Value Date/Time   CHOL 151 09/03/2012 0804   TRIG 126.0 09/03/2012 0804   HDL 40.10 09/03/2012 0804   CHOLHDL 4 09/03/2012 0804   VLDL 25.2 09/03/2012 0804   LDLCALC 86 09/03/2012 0804    Physical Exam:    VS:  BP 118/68   Pulse 88   Temp 98 F (36.7 C)   Ht 6\' 2"  (1.88 m)   Wt 217 lb 3.2 oz (98.5 kg)   BMI 27.89 kg/m     Wt Readings from Last 3 Encounters:  12/08/19 217 lb 3.2 oz (98.5 kg)  10/11/19 219 lb 9.6 oz (99.6 kg)  09/28/19 214 lb (97.1 kg)     GEN:  Well nourished, well developed in no acute distress HEENT: Normal NECK: No JVD; No carotid bruits LYMPHATICS: No lymphadenopathy CARDIAC: Irregular S1 variable s1 no murmurs, rubs, gallops RESPIRATORY:  Clear to auscultation without rales, wheezing or rhonchi  ABDOMEN: Soft, non-tender, non-distended MUSCULOSKELETAL: 1+ pitting lower extremity edema; No deformity  SKIN: Warm and dry NEUROLOGIC:  Alert and oriented x 3 PSYCHIATRIC:  Normal affect    Signed, Shirlee More, MD  12/08/2019 1:32 PM    [place]

## 2019-12-08 ENCOUNTER — Encounter: Payer: Self-pay | Admitting: Cardiology

## 2019-12-08 ENCOUNTER — Ambulatory Visit (INDEPENDENT_AMBULATORY_CARE_PROVIDER_SITE_OTHER): Payer: Medicare PPO | Admitting: Cardiology

## 2019-12-08 ENCOUNTER — Other Ambulatory Visit: Payer: Self-pay

## 2019-12-08 VITALS — BP 118/68 | HR 88 | Temp 98.0°F | Ht 74.0 in | Wt 217.2 lb

## 2019-12-08 DIAGNOSIS — I482 Chronic atrial fibrillation, unspecified: Secondary | ICD-10-CM

## 2019-12-08 DIAGNOSIS — I110 Hypertensive heart disease with heart failure: Secondary | ICD-10-CM | POA: Diagnosis not present

## 2019-12-08 DIAGNOSIS — Z7901 Long term (current) use of anticoagulants: Secondary | ICD-10-CM

## 2019-12-08 DIAGNOSIS — E782 Mixed hyperlipidemia: Secondary | ICD-10-CM | POA: Diagnosis not present

## 2019-12-08 DIAGNOSIS — I5032 Chronic diastolic (congestive) heart failure: Secondary | ICD-10-CM

## 2019-12-08 MED ORDER — METOPROLOL SUCCINATE ER 25 MG PO TB24: 25.0000 mg | ORAL_TABLET | Freq: Every day | ORAL | 3 refills | Status: DC

## 2019-12-08 NOTE — Patient Instructions (Addendum)
Medication Instructions:  Your physician recommends that you continue on your current medications as directed. Please refer to the Current Medication list given to you today.  *If you need a refill on your cardiac medications before your next appointment, please call your pharmacy*   Lab Work: Lipids, [place]   If you have labs (blood work) drawn today and your tests are completely normal, you will receive your results only by: Marland Kitchen MyChart Message (if you have MyChart) OR . A paper copy in the mail If you have any lab test that is abnormal or we need to change your treatment, we will call you to review the results.   Testing/Procedures: None ordered    Follow-Up: At [facility], you and your health needs are our priority.  As part of our continuing mission to provide you with exceptional heart care, we have created designated Provider Care Teams.  These Care Teams include your primary Cardiologist (physician) and Advanced Practice Providers (APPs -  Physician Assistants and Nurse Practitioners) who all work together to provide you with the care you need, when you need it.  We recommend signing up for the patient portal called "MyChart".  Sign up information is provided on this After Visit Summary.  MyChart is used to connect with patients for Virtual Visits (Telemedicine).  Patients are able to view lab/test results, encounter notes, upcoming appointments, etc.  Non-urgent messages can be sent to your provider as well.   To learn more about what you can do with MyChart, go to [email or web address].    Your next appointment:   3 month(s)  The format for your next appointment:   In Person  Provider:   Shirlee More, MD   Other Instructions None

## 2019-12-09 LAB — LIPID PANEL
Chol/HDL Ratio: 2.3 ratio (ref 0.0–5.0)
Cholesterol, Total: 88 mg/dL — ABNORMAL LOW (ref 100–199)
HDL: 39 mg/dL — ABNORMAL LOW (ref >39)
LDL Chol Calc (NIH): 39 mg/dL (ref 0–99)
Triglycerides: 34 mg/dL (ref 0–149)
VLDL Cholesterol Cal: 10 mg/dL (ref 5–40)

## 2019-12-09 LAB — COMPREHENSIVE METABOLIC PANEL
ALT: 19 IU/L (ref 0–44)
AST: 28 IU/L (ref 0–40)
Albumin/Globulin Ratio: 2.3 — ABNORMAL HIGH (ref 1.2–2.2)
Albumin: 4.2 g/dL (ref 3.6–4.6)
Alkaline Phosphatase: 95 IU/L (ref 39–117)
BUN/Creatinine Ratio: 14 (ref 10–24)
BUN: 13 mg/dL (ref 8–27)
Bilirubin Total: 1 mg/dL (ref 0.0–1.2)
CO2: 26 mmol/L (ref 20–29)
Calcium: 9.3 mg/dL (ref 8.6–10.2)
Chloride: 103 mmol/L (ref 96–106)
Creatinine, Ser: 0.91 mg/dL (ref 0.76–1.27)
GFR calc Af Amer: 88 mL/min/{1.73_m2} (ref >59)
GFR calc non Af Amer: 76 mL/min/{1.73_m2} (ref >59)
Globulin, Total: 1.8 g/dL (ref 1.5–4.5)
Glucose: 103 mg/dL — ABNORMAL HIGH (ref 65–99)
Potassium: 4.6 mmol/L (ref 3.5–5.2)
Sodium: 142 mmol/L (ref 134–144)
Total Protein: 6 g/dL (ref 6.0–8.5)

## 2019-12-09 LAB — PRO B NATRIURETIC PEPTIDE: NT-Pro BNP: 2878 pg/mL — ABNORMAL HIGH (ref 0–486)

## 2020-01-14 ENCOUNTER — Other Ambulatory Visit: Payer: Self-pay

## 2020-01-14 ENCOUNTER — Encounter: Payer: Self-pay | Admitting: Sports Medicine

## 2020-01-14 ENCOUNTER — Ambulatory Visit (INDEPENDENT_AMBULATORY_CARE_PROVIDER_SITE_OTHER): Payer: Medicare PPO | Admitting: Sports Medicine

## 2020-01-14 DIAGNOSIS — M79676 Pain in unspecified toe(s): Secondary | ICD-10-CM | POA: Diagnosis not present

## 2020-01-14 DIAGNOSIS — I739 Peripheral vascular disease, unspecified: Secondary | ICD-10-CM

## 2020-01-14 DIAGNOSIS — B351 Tinea unguium: Secondary | ICD-10-CM | POA: Diagnosis not present

## 2020-01-14 DIAGNOSIS — D689 Coagulation defect, unspecified: Secondary | ICD-10-CM | POA: Diagnosis not present

## 2020-01-14 NOTE — Progress Notes (Signed)
Subjective: Eric [name] is a 86 y.o. male patient seen today in office with complaint of painful thickened and elongated toenails; unable to trim. Patient denies any changes with medications or medical history, NOT DIABETIC but is on Eliquis like before. No new issues.   Patient Active Problem List   Diagnosis Date Noted  . Benign paroxysmal positional vertigo due to bilateral vestibular disorder 09/20/2019  . Non-seasonal allergic rhinitis 09/06/2019  . Herpes zoster without complication 99991111  . Moderate major depression ([place]) 03/15/2019  . Postherpetic neuralgia 02/04/2019  . Thrombocytopenia ([place]) 12/25/2018  . Chronic anticoagulation 06/24/2018  . Type 2 diabetes mellitus with stage 3 chronic kidney disease, without long-term current use of insulin ([place]) 12/10/2017  . Abnormal liver enzymes 11/21/2017  . CKD (chronic kidney disease) stage 3, GFR 30-59 ml/min 11/21/2017  . Anemia of unknown etiology 11/17/2017  . C. difficile colitis 11/17/2017  . Unexplained weight loss 11/17/2017  . Hemoptysis 11/17/2017  . SOB (shortness of breath) 12/10/2016  . Multiple pulmonary nodules 11/12/2016  . Chronic obstructive pulmonary disease ([place]) 07/01/2016  . Mixed hyperlipidemia 07/01/2016  . Status post total left knee replacement 09/14/2015  . Elevated PSA 12/07/2012  . Chest pain in adult 07/28/2011  . HYPOTENSION, ORTHOSTATIC 05/15/2010  . Congestive heart failure ([place]) 05/11/2010  . CHEST PAIN UNSPECIFIED 03/20/2010  . Coronary artery disease involving native coronary artery of native heart without angina pectoris 10/23/2007  . Chronic atrial fibrillation 07/23/2007  . Hypertensive heart disease 07/21/2007  . GERD 07/21/2007  . CEREBROVASCULAR ACCIDENT, HX OF 07/21/2007  . Essential hypertension 07/21/2007    Current Outpatient Medications on File Prior to Visit  Medication Sig Dispense Refill  . albuterol (PROAIR HFA) 108 (90 BASE) MCG/ACT inhaler Inhale 2 puffs into the  lungs every 6 (six) hours as needed for wheezing or shortness of breath. 1 Inhaler 5  . atorvastatin (LIPITOR) 40 MG tablet TAKE 1 TABLET AT BEDTIME 90 tablet 1  . bisacodyl (DULCOLAX) 5 MG EC tablet Take 1 tablet by mouth as needed.    . Blood Glucose Monitoring Suppl (GLUCOCOM BLOOD GLUCOSE MONITOR) DEVI Check blood sugar once a day    . carvedilol (COREG) 3.125 MG tablet Take 3.125 mg by mouth 2 (two) times daily with a meal.    . clorazepate (TRANXENE) 3.75 MG tablet Take 1 tablet (3.75 mg total) by mouth 3 (three) times daily as needed for anxiety. 90 tablet 0  . cycloSPORINE (RESTASIS) 0.05 % ophthalmic emulsion Place 1 drop into both eyes 2 (two) times daily.     . Difluprednate (DUREZOL) 0.05 % EMUL Place 1 drop into both eyes daily.     Marland Kitchen docusate sodium (COLACE) 100 MG capsule Take 100 mg by mouth 2 (two) times daily.     Marland Kitchen apixaban (ELIQUIS) 5 MG TABS tablet Take 1 tablet (5 mg total) by mouth 2 (two) times daily. 180 tablet 1  . aspirin EC 81 MG tablet Take 1 tablet (81 mg total) by mouth daily. 90 tablet 3  . azithromycin (ZITHROMAX) 250 MG tablet     . bethanechol (URECHOLINE) 25 MG tablet Take 1 tablet by mouth 2 (two) times daily.    . Bimatoprost (LUMIGAN) 0.01 % SOLN Place 1 drop into both eyes at bedtime. Both eyes at hour of sleep    . brimonidine (ALPHAGAN) 0.2 % ophthalmic solution INSTILL ONE DROP IN BOTH EYES TWICE A DAY    . Cholecalciferol (VITAMIN D3) 25 MCG (1000 UT) CAPS Take  1 capsule by mouth daily.    Marland Kitchen escitalopram (LEXAPRO) 5 MG tablet Take 1 tablet by mouth daily.  5  . fexofenadine (ALLEGRA) 180 MG tablet Take 180 mg by mouth daily.     . fluticasone (FLONASE) 50 MCG/ACT nasal spray     . furosemide (LASIX) 40 MG tablet Take 1 tablet (40 mg total) by mouth daily. 90 tablet 2  . gabapentin (NEURONTIN) 100 MG capsule Take 200 mg by mouth 2 (two) times daily.    . isosorbide mononitrate (IMDUR) 30 MG 24 hr tablet     . lidocaine (LIDODERM) 5 % 1 patch as  directed.    . metoprolol succinate (TOPROL-XL) 25 MG 24 hr tablet Take 1 tablet (25 mg total) by mouth daily. Take with or immediately following a meal. 90 tablet 3  . multivitamin (THERAGRAN) per tablet Take 1 tablet by mouth daily.     . mupirocin ointment (BACTROBAN) 2 % Apply topically.    . nitroGLYCERIN (NITROSTAT) 0.4 MG SL tablet Place 0.4 mg under the tongue every 5 (five) minutes as needed for chest pain.    Marland Kitchen omeprazole (PRILOSEC) 20 MG capsule Take 20 mg by mouth daily.    . tamsulosin (FLOMAX) 0.4 MG CAPS capsule Take 0.4 mg by mouth daily.     . TRUE METRIX BLOOD GLUCOSE TEST test strip     . vitamin C (ASCORBIC ACID) 500 MG tablet Take 1,000 mg by mouth daily.      No current facility-administered medications on file prior to visit.    Allergies  Allergen Reactions  . Enoxaparin Sodium [Enoxaparin Sodium] Anaphylaxis and Other (See Comments)    angioedema  . Codeine Other (See Comments)    hallucinations hallucinations  . Oxycodone Hcl Other (See Comments)    hallucinations  . Simvastatin Other (See Comments)  . Sulfa Antibiotics Other (See Comments)    Unknown.  . Sulfamethoxazole Other (See Comments)    Unknown.  . Vicodin [Hydrocodone-Acetaminophen] Other (See Comments)    hallucinations  . Gemfibrozil Rash  . Sulfonamide Derivatives Rash  . Tramadol Rash    Objective: Physical Exam  General: Well developed, nourished, no acute distress, awake, alert and oriented x 3  Vascular: Dorsalis pedis artery 1/4 bilateral, Posterior tibial artery 1/4 bilateral, skin temperature warm to warm proximal to distal bilateral lower extremities, + varicosities, scant pedal hair present bilateral.  Neurological: Gross sensation present via light touch bilateral.   Dermatological: Skin is warm, dry, and supple bilateral, Nails 1-10 are tender, long, thick, and discolored with mild subungal debris, no webspace macerations present bilateral, no open lesions present bilateral,  no callus/corns/hyperkeratotic tissue present bilateral. No signs of infection bilateral.  Musculoskeletal: No symptomatic boney deformities noted bilateral. Muscular strength within normal limits without painon range of motion. No pain with calf compression bilateral.  Assessment and Plan:  Problem List Items Addressed This Visit    None    Visit Diagnoses    Pain due to onychomycosis of toenail    -  Primary   Coagulation defect (HCC)       PVD (peripheral vascular disease) (HCC)       Relevant Medications   isosorbide mononitrate (IMDUR) 30 MG 24 hr tablet   carvedilol (COREG) 3.125 MG tablet     -Examined patient.  -Re-Discussed treatment options for painful mycotic nails. -Mechanically debrided and reduced mycotic nails with sterile nail nipper and dremel nail file without incident. -Patient to return in 3 months for follow  up evaluation or sooner if symptoms worsen.  Landis Martins, DPM

## 2020-01-20 ENCOUNTER — Encounter: Payer: Self-pay | Admitting: Cardiology

## 2020-01-25 ENCOUNTER — Telehealth: Payer: Self-pay | Admitting: Cardiology

## 2020-01-25 NOTE — Telephone Encounter (Signed)
Pt c/o of Chest Pain: STAT if CP now or developed within 24 hours  1. Are you having CP right now? no  2. Are you experiencing any other symptoms (ex. SOB, nausea, vomiting, sweating)? Bruising on his right arm from his elbow to his hand and states he has a rash on his stomach. He is wondering if this could be from shingles.  3. How long have you been experiencing CP? Happened yesterday but only lasted a couple minutes. States he felt like a knife went into the center of his chest.   4. Is your CP continuous or coming and going? Neither. He states it only happened that once and it has not happened since.   5. Have you taken Nitroglycerin? no ?

## 2020-01-25 NOTE — Telephone Encounter (Signed)
Spoke to the patient just now. He let me know that he has not had any chest pain after this incident that happened yesterday. He states that yesterday the pain only lasted for 30-60 seconds. Dr. Bettina Gavia advised that he continue to carry his nitroglycerin with him and to take it if needed. He also advised for the patient to call us back if he starts having continuous chest pain.    Encouraged patient to call back with any questions or concerns.

## 2020-01-26 ENCOUNTER — Telehealth: Payer: Self-pay

## 2020-01-26 NOTE — Telephone Encounter (Signed)
Spoke to patient again just now and he said that he wanted to let me know about the chest pain that he had several days ago. I reiterated to him that we discussed this yesterday and that Dr. Joya Gaskins recommendation was for him to carry his nitroglycerin with him at all times but no other changes were needed at this time.   Patient verbalizes understanding and no other issues or concerns were noted at this time.

## 2020-01-26 NOTE — Telephone Encounter (Signed)
Spoke to the patient just now and her was just following up on his call from yesterday. He states that he went to his PCP yesterday and they gave him medication for the rash he was telling me about yesterday. Patient states that he is bruising on his hands, knees, and some on his chest. He is wondering if he should decrease his dose of Eliquis or aspirin.   No other issues or concerns were noted at this time.    Encouraged patient to call back with any questions or concerns.

## 2020-01-26 NOTE — Telephone Encounter (Signed)
Patient calling back to follow up. He states he is supposed to get a call back.

## 2020-01-26 NOTE — Telephone Encounter (Signed)
Patient calling back. He states he forgot to tell the nurse something. Transferred call to the nurse.

## 2020-01-27 NOTE — Telephone Encounter (Signed)
I would not change his anticoagulant I would follow the instructions from his family doctor

## 2020-01-27 NOTE — Telephone Encounter (Signed)
Spoke to the patient just now and let him know Dr. Joya Gaskins recommendations. No other issues or concerns were noted at this time.    Encouraged patient to call back with any questions or concerns.

## 2020-01-28 ENCOUNTER — Telehealth: Payer: Self-pay | Admitting: Cardiology

## 2020-01-28 NOTE — Telephone Encounter (Signed)
Spoke to the patient just now. He was just calling to let me know that he had an episode of chest pain the other day. This episode was actually already reported to me earlier this week, the patient states that he had just forgotten that he had called [place]. I went over with him again what Dr. Bettina Gavia had recommended for him and he verbalizes understanding. No other issues or concerns were noted at this time.    Encouraged patient to call back with any questions or concerns.

## 2020-01-28 NOTE — Telephone Encounter (Signed)
° °  Pt said would like to speak with Dr. Bettina Gavia regarding some issues he have. He did not explain further he wanted to speak with him  Please advise

## 2020-02-02 DIAGNOSIS — D692 Other nonthrombocytopenic purpura: Secondary | ICD-10-CM | POA: Insufficient documentation

## 2020-02-02 DIAGNOSIS — D492 Neoplasm of unspecified behavior of bone, soft tissue, and skin: Secondary | ICD-10-CM | POA: Insufficient documentation

## 2020-03-13 ENCOUNTER — Ambulatory Visit: Payer: Medicare PPO | Admitting: Cardiology

## 2020-03-14 ENCOUNTER — Ambulatory Visit (INDEPENDENT_AMBULATORY_CARE_PROVIDER_SITE_OTHER): Payer: Medicare PPO | Admitting: Cardiology

## 2020-03-14 ENCOUNTER — Encounter: Payer: Self-pay | Admitting: Cardiology

## 2020-03-14 ENCOUNTER — Other Ambulatory Visit: Payer: Self-pay

## 2020-03-14 VITALS — BP 130/70 | HR 71 | Ht 74.0 in | Wt 208.0 lb

## 2020-03-14 DIAGNOSIS — I482 Chronic atrial fibrillation, unspecified: Secondary | ICD-10-CM | POA: Diagnosis not present

## 2020-03-14 DIAGNOSIS — Z7901 Long term (current) use of anticoagulants: Secondary | ICD-10-CM

## 2020-03-14 DIAGNOSIS — I110 Hypertensive heart disease with heart failure: Secondary | ICD-10-CM

## 2020-03-14 DIAGNOSIS — I472 Ventricular tachycardia, unspecified: Secondary | ICD-10-CM

## 2020-03-14 DIAGNOSIS — R0602 Shortness of breath: Secondary | ICD-10-CM

## 2020-03-14 DIAGNOSIS — I5032 Chronic diastolic (congestive) heart failure: Secondary | ICD-10-CM

## 2020-03-14 MED ORDER — FUROSEMIDE 40 MG PO TABS
ORAL_TABLET | ORAL | 3 refills | Status: DC
Start: 2020-03-14 — End: 2021-02-14

## 2020-03-14 NOTE — Patient Instructions (Signed)
Medication Instructions:  Your physician has recommended you make the following change in your medication:  INCREASE: Take an extra dose of your furosemide on Monday, Wednesday, and Friday afternoons.  *If you need a refill on your cardiac medications before your next appointment, please call your pharmacy*   Lab Work: Your physician recommends that you return for lab work in: TODAY BMP, ProBNP If you have labs (blood work) drawn today and your tests are completely normal, you will receive your results only by: Marland Kitchen MyChart Message (if you have MyChart) OR . A paper copy in the mail If you have any lab test that is abnormal or we need to change your treatment, we will call you to review the results.   Testing/Procedures: None   Follow-Up: At [facility], you and your health needs are our priority.  As part of our continuing mission to provide you with exceptional heart care, we have created designated Provider Care Teams.  These Care Teams include your primary Cardiologist (physician) and Advanced Practice Providers (APPs -  Physician Assistants and Nurse Practitioners) who all work together to provide you with the care you need, when you need it.  We recommend signing up for the patient portal called "MyChart".  Sign up information is provided on this After Visit Summary.  MyChart is used to connect with patients for Virtual Visits (Telemedicine).  Patients are able to view lab/test results, encounter notes, upcoming appointments, etc.  Non-urgent messages can be sent to your provider as well.   To learn more about what you can do with MyChart, go to [email or web address].    Your next appointment:   3 month(s)  The format for your next appointment:   In Person  Provider:   Shirlee More, MD   Other Instructions

## 2020-03-14 NOTE — Progress Notes (Signed)
Cardiology Office Note:    Date:  03/14/2020   ID:  Eric [name], DOB 1931-02-05, MRN [id number]  PCP:  Eric [name]., MD  Cardiologist:  Eric More, MD    Referring MD: Eric [name]., MD    ASSESSMENT:    1. Chronic atrial fibrillation ([place])   2. Chronic anticoagulation   3. Hypertensive heart disease with chronic diastolic congestive heart failure ([place])   4. VT (ventricular tachycardia) (HCC)    PLAN:    In order of problems listed above:  1. Stable rate controlled continue beta-blocker and his direct anticoagulant he has had no recurrent TIA since transition from warfarin 2. BP at target continue current treatment including beta-blocker and diuretic.  He has edema we will increase his dose of diuretic recheck renal function proBNP level.  He takes gabapentin which may be the reason for fluid retention 3. Asymptomatic not an antiarrhythmic drug at this time after EP evaluation.   Next appointment: 3 months   Medication Adjustments/Labs and Tests Ordered: Current medicines are reviewed at length with the patient today.  Concerns regarding medicines are outlined above.  No orders of the defined types were placed in this encounter.  No orders of the defined types were placed in this encounter.   Chief Complaint  Patient presents with   Follow-up   Atrial Fibrillation   Anticoagulation   Congestive Heart Failure    History of Present Illness:    Eric [name] is a 86 y.o. male with a hx of chronic AF on warfarin, non anginal chest pain, hypertension and COPD  last seen 08/03/2019 after TIA with bilateral occipital visual loss with inadequate INR. He was transitioned to direct anticoagulant and a subsequent ambulatory heart rhythm monitor showed nonsustained ventricular tachycardia.  He was seen by EP and not advised antiarrhythmic therapy. He has a history of remote normal coronary angiography from record review in care everywhere.  2015 myocardial  perfusion study at [facility] showed mild inferior lateral ischemia EF 52%.    Echo cardiogram 12/01/2019 60 to 65% moderate left and right atrial enlargement.  Perfusion study 09/28/2019 EF 67% and normal perfusion.  He was last seen 12/08/2019.  Compliance with diet, lifestyle and medications: Yes  In general he is stable he and his wife live independently he has a lot of trouble ambulating because of hip pain he has edema but he says his shortness of breath is stable he has no orthopnea he sleeps on CPAP but he has increased peripheral edema.  No recent chest pain palpitation or syncope.  He takes his anticoagulant and no bleeding complication Past Medical History:  Diagnosis Date   Abnormal liver enzymes 11/21/2017   Anemia of unknown etiology 11/17/2017   Atrial fibrillation (HCC)    Benign paroxysmal positional vertigo due to bilateral vestibular disorder 09/20/2019   Blood transfusion without reported diagnosis    C. difficile colitis 11/17/2017   CAD (coronary artery disease)    CEREBROVASCULAR ACCIDENT, HX OF 07/21/2007   Qualifier: Diagnosis of  By: Rogue Bussing CMA, Jacqualynn     Chest pain in adult 07/28/2011   CHEST PAIN UNSPECIFIED 03/20/2010   Qualifier: Diagnosis of  By: Burnice Logan  MD, Doretha Sou    CHF (congestive heart failure) ([place])    Chronic anticoagulation 06/24/2018   Chronic atrial fibrillation 07/23/2007   Qualifier: Diagnosis of  By: Burnice Logan  MD, Doretha Sou    Chronic obstructive pulmonary disease ([place]) 07/01/2016   CKD (chronic kidney disease)  stage 3, GFR 30-59 ml/min 11/21/2017   Congestive heart failure ([place]) 05/11/2010   Qualifier: Diagnosis of  By: Burnice Logan  MD, Doretha Sou    Coronary artery disease involving native coronary artery of native heart without angina pectoris 10/23/2007     - Left ventricle: The cavity size was normal. Systolic function was     normal. The estimated ejection fraction was in the range of 55% to     60%. Wall motion  was normal; there were no regional wall motion     abnormalities.   - Left atrium: The atrium was mildly to moderately dilated.   - Right ventricle: The cavity size was mildly dilated.   - Right atrium: The atrium was mildly to moderately   Elevated PSA 12/07/2012   Noted December 2013. Discussed with patient who does not desire further evaluation    Essential hypertension 07/21/2007   Overview:  Overview:  Qualifier: Diagnosis of  By: Rogue Bussing CMA, Jacqualynn   Qualifier: Diagnosis of  By: Sherren Mocha MD, Jory Ee   GERD 07/21/2007   Qualifier: Diagnosis of  By: Rogue Bussing CMA, Jacqualynn   Qualifier: Diagnosis of  By: Sherren Mocha MD, Jory Ee    GERD (gastroesophageal reflux disease)    GI bleed    Hemoptysis 11/17/2017   Herpes zoster without complication 38/03/5642   Hyperlipidemia    Hypertension    Hypertensive heart disease 07/21/2007   Qualifier: Diagnosis of  By: Rogue Bussing CMA, Maryann Alar   Qualifier: Diagnosis of  By: Sherren Mocha MD, Jory Ee    HYPOTENSION, ORTHOSTATIC 05/15/2010   Qualifier: Diagnosis of  By: Burnice Logan  MD, Doretha Sou    Mixed hyperlipidemia 07/01/2016   Moderate major depression ([place]) 03/15/2019   Multiple pulmonary nodules 11/12/2016   Overview:  Seen on ct of chest 11/15/16. 38mm nodules in right major fissure and 93mm at minor fissure right lung.  55mm  nodule at left major fissure.    Non-seasonal allergic rhinitis 09/06/2019   NSAID-induced gastric ulcer    Postherpetic neuralgia 02/04/2019   SOB (shortness of breath) 12/10/2016   Status post total left knee replacement 09/14/2015   Thrombocytopenia ([place]) 12/25/2018   Trigeminal neuralgia    Type 2 diabetes mellitus with stage 3 chronic kidney disease, without long-term current use of insulin ([place]) 12/10/2017   Unexplained weight loss 11/17/2017    Past Surgical History:  Procedure Laterality Date   CHOLECYSTECTOMY     ESOPHAGOGASTRODUODENOSCOPY  2011   FLEXIBLE SIGMOIDOSCOPY N/A 06/04/2013   Procedure:  FLEXIBLE SIGMOIDOSCOPY;  Surgeon: Winfield Cunas., MD;  Location[facility]COPY;  Service: Endoscopy;  Laterality: N/A;   HIP SURGERY  August 2008   Right total hip replacement   SHOULDER SURGERY     Left   TOTAL KNEE ARTHROPLASTY  June 2009   Left    Current Medications: Current Meds  Medication Sig   albuterol (PROAIR HFA) 108 (90 BASE) MCG/ACT inhaler Inhale 2 puffs into the lungs every 6 (six) hours as needed for wheezing or shortness of breath.   apixaban (ELIQUIS) 5 MG TABS tablet Take 1 tablet (5 mg total) by mouth 2 (two) times daily.   aspirin EC 81 MG tablet Take 1 tablet (81 mg total) by mouth daily.   atorvastatin (LIPITOR) 40 MG tablet TAKE 1 TABLET AT BEDTIME   azithromycin (ZITHROMAX) 250 MG tablet    bethanechol (URECHOLINE) 25 MG tablet Take 1 tablet by mouth 2 (two) times daily.   Bimatoprost (LUMIGAN) 0.01 % SOLN Place  1 drop into both eyes at bedtime. Both eyes at hour of sleep   bisacodyl (DULCOLAX) 5 MG EC tablet Take 1 tablet by mouth as needed.   Blood Glucose Monitoring Suppl (GLUCOCOM BLOOD GLUCOSE MONITOR) DEVI Check blood sugar once a day   brimonidine (ALPHAGAN) 0.2 % ophthalmic solution INSTILL ONE DROP IN BOTH EYES TWICE A DAY   carvedilol (COREG) 3.125 MG tablet Take 3.125 mg by mouth 2 (two) times daily with a meal.   Cholecalciferol (VITAMIN D3) 25 MCG (1000 UT) CAPS Take 1 capsule by mouth daily.   clorazepate (TRANXENE) 3.75 MG tablet Take 1 tablet (3.75 mg total) by mouth 3 (three) times daily as needed for anxiety.   cycloSPORINE (RESTASIS) 0.05 % ophthalmic emulsion Place 1 drop into both eyes 2 (two) times daily.    Difluprednate (DUREZOL) 0.05 % EMUL Place 1 drop into both eyes daily.    docusate sodium (COLACE) 100 MG capsule Take 100 mg by mouth 2 (two) times daily.    escitalopram (LEXAPRO) 5 MG tablet Take 1 tablet by mouth daily.   fexofenadine (ALLEGRA) 180 MG tablet Take 180 mg by mouth daily.    fluticasone  (FLONASE) 50 MCG/ACT nasal spray    furosemide (LASIX) 40 MG tablet Take 1 tablet (40 mg total) by mouth daily.   gabapentin (NEURONTIN) 100 MG capsule Take 200 mg by mouth 2 (two) times daily.   isosorbide mononitrate (IMDUR) 30 MG 24 hr tablet    lidocaine (LIDODERM) 5 % 1 patch as directed.   metoprolol succinate (TOPROL-XL) 25 MG 24 hr tablet Take 1 tablet (25 mg total) by mouth daily. Take with or immediately following a meal.   multivitamin (THERAGRAN) per tablet Take 1 tablet by mouth daily.    mupirocin ointment (BACTROBAN) 2 % Apply topically.   nitroGLYCERIN (NITROSTAT) 0.4 MG SL tablet Place 0.4 mg under the tongue every 5 (five) minutes as needed for chest pain.   omeprazole (PRILOSEC) 20 MG capsule Take 20 mg by mouth daily.   tamsulosin (FLOMAX) 0.4 MG CAPS capsule Take 0.4 mg by mouth daily.    TRUE METRIX BLOOD GLUCOSE TEST test strip    vitamin C (ASCORBIC ACID) 500 MG tablet Take 1,000 mg by mouth daily.      Allergies:   Enoxaparin sodium [enoxaparin sodium], Codeine, Oxycodone hcl, Simvastatin, Sulfa antibiotics, Sulfamethoxazole, Vicodin [hydrocodone-acetaminophen], Gemfibrozil, Sulfonamide derivatives, and Tramadol   Social History   Socioeconomic History   Marital status: Married    Spouse name: Not on file   Number of children: Not on file   Years of education: Not on file   Highest education level: Not on file  Occupational History   Not on file  Tobacco Use   Smoking status: Former Smoker    Quit date: 09/23/1958    Years since quitting: 61.5   Smokeless tobacco: Former Systems developer   Tobacco comment: quit smoking1980-quit chewing 6 months  Vaping Use   Vaping Use: Never used  Substance and Sexual Activity   Alcohol use: No   Drug use: No   Sexual activity: Not on file  Other Topics Concern   Not on file  Social History Narrative   Not on file   Social Determinants of Health   Financial Resource Strain:    Difficulty of Paying  Living Expenses:   Food Insecurity:    Worried About [place] in the Last Year:    [place] in the Last Year:  Transportation Needs:    Film/video editor (Medical):    Lack of Transportation (Non-Medical):   Physical Activity:    Days of Exercise per Week:    Minutes of Exercise per Session:   Stress:    Feeling of Stress :   Social Connections:    Frequency of Communication with Friends and Family:    Frequency of Social Gatherings with Friends and Family:    Attends Religious Services:    Active Member of Clubs or Organizations:    Attends Archivist Meetings:    Marital Status:      Family History: The patient's family history includes Alzheimer's disease in his mother; Diabetes in his sister; Heart attack in his father. ROS:   Please see the history of present illness.    All other systems reviewed and are negative.  EKGs/Labs/Other Studies Reviewed:    The following studies were reviewed today:  EKG:  EKG ordered today and personally reviewed.  The ekg ordered today demonstrates rate controlled atrial fibrillation  Recent Labs: 09/02/2019: Hemoglobin 10.8; Platelets 104 12/08/2019: ALT 19; BUN 13; Creatinine, Ser 0.91; NT-Pro BNP 2,878; Potassium 4.6; Sodium 142  Recent Lipid Panel    Component Value Date/Time   CHOL 88 (L) 12/08/2019 1343   TRIG 34 12/08/2019 1343   HDL 39 (L) 12/08/2019 1343   CHOLHDL 2.3 12/08/2019 1343   CHOLHDL 4 09/03/2012 0804   VLDL 25.2 09/03/2012 0804   LDLCALC 39 12/08/2019 1343    Physical Exam:    VS:  BP 130/70    Pulse 71    Ht 6\' 2"  (1.88 m)    Wt 208 lb (94.3 kg)    SpO2 94%    BMI 26.71 kg/m     Wt Readings from Last 3 Encounters:  03/14/20 208 lb (94.3 kg)  12/08/19 217 lb 3.2 oz (98.5 kg)  10/11/19 219 lb 9.6 oz (99.6 kg)     GEN: Increasingly frail appearance  in no acute distress HEENT: Normal NECK: No JVD; No carotid bruits LYMPHATICS: No lymphadenopathy CARDIAC:  Irregular rhythm S1 variable, no murmurs, rubs, gallops RESPIRATORY:  Clear to auscultation without rales, wheezing or rhonchi  ABDOMEN: Soft, non-tender, non-distended MUSCULOSKELETAL: Bilateral pitting lower extremity 2+ edema; No deformity  SKIN: Warm and dry NEUROLOGIC:  Alert and oriented x 3 PSYCHIATRIC:  Normal affect    Signed, Eric More, MD  03/14/2020 2:12 PM    [facility]

## 2020-03-15 ENCOUNTER — Telehealth: Payer: Self-pay

## 2020-03-15 LAB — BASIC METABOLIC PANEL
BUN/Creatinine Ratio: 21 (ref 10–24)
BUN: 20 mg/dL (ref 8–27)
CO2: 27 mmol/L (ref 20–29)
Calcium: 8.9 mg/dL (ref 8.6–10.2)
Chloride: 103 mmol/L (ref 96–106)
Creatinine, Ser: 0.96 mg/dL (ref 0.76–1.27)
GFR calc Af Amer: 82 mL/min/{1.73_m2} (ref >59)
GFR calc non Af Amer: 71 mL/min/{1.73_m2} (ref >59)
Glucose: 105 mg/dL — ABNORMAL HIGH (ref 65–99)
Potassium: 4.5 mmol/L (ref 3.5–5.2)
Sodium: 142 mmol/L (ref 134–144)

## 2020-03-15 LAB — PRO B NATRIURETIC PEPTIDE: NT-Pro BNP: 2636 pg/mL — ABNORMAL HIGH (ref 0–486)

## 2020-03-15 NOTE — Telephone Encounter (Signed)
Tried calling patient. No answer and no voicemail set up for me to leave a message. 

## 2020-03-15 NOTE — Telephone Encounter (Signed)
Spoke with patient regarding results and recommendation.  Patient verbalizes understanding and is agreeable to plan of care. Advised patient to call back with any issues or concerns.  

## 2020-03-15 NOTE — Telephone Encounter (Signed)
-----   Message from Richardo Priest, MD sent at 03/15/2020  7:49 AM EDT ----- Normal or stable result  No changes I increased his diuretic yesterday

## 2020-04-17 ENCOUNTER — Other Ambulatory Visit: Payer: Self-pay | Admitting: Cardiology

## 2020-04-20 ENCOUNTER — Ambulatory Visit: Payer: Medicare PPO | Admitting: Podiatry

## 2020-05-04 ENCOUNTER — Ambulatory Visit: Payer: Medicare PPO | Admitting: Podiatry

## 2020-05-17 ENCOUNTER — Telehealth: Payer: Self-pay | Admitting: Cardiology

## 2020-05-17 DIAGNOSIS — I482 Chronic atrial fibrillation, unspecified: Secondary | ICD-10-CM

## 2020-05-17 DIAGNOSIS — Z7901 Long term (current) use of anticoagulants: Secondary | ICD-10-CM

## 2020-05-17 NOTE — Telephone Encounter (Signed)
I am extremely reluctant to stop this we had switch from warfarin when he had a TIA and his INR was subtherapeutic if he truly cannot afford it he needs to be seen by the warfarin program for them to transposition him to warfarin and he will need frequent INRs performed.

## 2020-05-17 NOTE — Telephone Encounter (Signed)
Spoke to the patient just now who let me know that he is willing to switch back to warfarin even with having to come in for the INR checks frequently. I verbalizes understanding and placed the order for the coumadin clinic to be set up. I let him know that they would call to schedule this appointment with him and he verbalizes understanding.    Encouraged patient to call back with any questions or concerns.

## 2020-05-17 NOTE — Telephone Encounter (Signed)
Pt c/o medication issue:  1. Name of Medication: ELIQUIS 5 MG TABS tablet  2. How are you currently taking this medication (dosage and times per day)? 1 tablet by mouth twice daily  3. Are you having a reaction (difficulty breathing--STAT)? No    4. What is your medication issue? Katie is calling stating he can longer afford this medication. He states the pharmacy advised him a 2 month supply cost $540 and a 1 month supply cost $140. Please advise.

## 2020-05-25 NOTE — Telephone Encounter (Signed)
I called and spoke with patient who states he wants to go back to warfarin. He only has enough Eliquis for tomorrow. I advised that we will need to bridge him with lovenox since he is very high risk for stroke off anticoagulation. Patient states he did not want to do lovenox shots. Asking if he can get samples. I advised that it would be best for him to be on lovenox but I would speak to Dr. Bettina Gavia about a crossover with Eliquis.  I spoke with Dr. Bettina Gavia who agreed that lovenox bridge is the best option for the patient . He stated "He is extremely high risk he had a very focal TIA where he had transient binocular blindness when he had a subtherapeutic INR. I personally think it is a big mistake for him to leave Eliquis but is purely financially driven and he is one of the few people that I would bridge with Lovenox."  I called patient back to reinforce patient that it is in his best interest to do the lovenox bridge. Patient was not home. I left my number with his wife to call back.

## 2020-05-26 NOTE — Telephone Encounter (Signed)
Called again.  Patient's wife answered who said husband was not available.

## 2020-05-26 NOTE — Telephone Encounter (Signed)
Attempted to call patient again.  No answer and no voicemail picked up

## 2020-05-26 NOTE — Telephone Encounter (Signed)
Attempted to call patient regarding warfarin and Lovenox.  No answer and no voicemail pickup on home line and no answer no voicemail on mobile number either.  Will attempt call back again this morning

## 2020-05-30 ENCOUNTER — Other Ambulatory Visit: Payer: Self-pay

## 2020-05-30 ENCOUNTER — Ambulatory Visit (INDEPENDENT_AMBULATORY_CARE_PROVIDER_SITE_OTHER): Payer: Medicare PPO

## 2020-05-30 DIAGNOSIS — I482 Chronic atrial fibrillation, unspecified: Secondary | ICD-10-CM

## 2020-05-30 DIAGNOSIS — Z7901 Long term (current) use of anticoagulants: Secondary | ICD-10-CM

## 2020-05-30 MED ORDER — APIXABAN 5 MG PO TABS: ORAL_TABLET | ORAL | 0 refills | Status: DC

## 2020-05-30 NOTE — Patient Instructions (Signed)
Patient has decided to resume Eliquis until he speaks to Dr Bettina Gavia 06/14/2020.

## 2020-06-08 DIAGNOSIS — R269 Unspecified abnormalities of gait and mobility: Secondary | ICD-10-CM | POA: Insufficient documentation

## 2020-06-08 DIAGNOSIS — R5381 Other malaise: Secondary | ICD-10-CM | POA: Insufficient documentation

## 2020-06-09 ENCOUNTER — Telehealth: Payer: Self-pay | Admitting: Cardiology

## 2020-06-09 NOTE — Telephone Encounter (Signed)
Called patient he reports that since Tuesday his left knee has had a blue knot that is down below his knee as well. It is painful, swollen, and hot to touch. He reports swelling in that leg as well. He takes eliquis 5 mg twice daily. He was at his pcp office yesterday he says they drew labs but he is unsure of results. He will not be able to go to hospital or for testing anywhere because he states he can't leave his wife at home, she has fallen recently and he can't leave her. Will consult with Dr. Bettina Gavia.

## 2020-06-09 NOTE — Telephone Encounter (Signed)
I do not think I have an answer if he is seen by his physician today I would let them handle this problem or alternately to go to urgent care or the emergency room.

## 2020-06-09 NOTE — Telephone Encounter (Signed)
Patient states that he has a knot above his knee and now it has turned blue and is hot to the touch.

## 2020-06-09 NOTE — Telephone Encounter (Signed)
Spoke to patient just now and let him know these recommendations. He verbalizes understanding and thanks me for the call back.

## 2020-06-13 NOTE — Progress Notes (Signed)
Cardiology Office Note:    Date:  06/14/2020   ID:  Eric [name], DOB Feb 06, 1931, MRN [id number]  PCP:  Raina Mina., MD  Cardiologist:  Shirlee More, MD    Referring MD: Raina Mina., MD    ASSESSMENT:    1. Chronic atrial fibrillation ([place])   2. Chronic anticoagulation   3. Hypertensive heart disease with chronic diastolic congestive heart failure ([place])   4. Mixed hyperlipidemia   5. Non-sustained ventricular tachycardia (H[facility])    PLAN:    In order of problems listed above:  1. Rate is controlled with low-dose beta-blocker and remain anticoagulated with Eliquis and aspirin as he had a focal TIA with bilateral visual loss on warfarin subtherapeutic INR. 2. Stable BP at target heart failure is reasonably compensated we will continue his current loop diuretic recheck BMP proBNP oral nitrate beta-blocker note he is on gabapentin that may be responsible for his edema 3. Stable continue his statin 4. Stable was evaluated by EP EF preserved and not felt to require antiarrhythmic drug or device   Next appointment: 6 months   Medication Adjustments/Labs and Tests Ordered: Current medicines are reviewed at length with the patient today.  Concerns regarding medicines are outlined above.  Orders Placed This Encounter  Procedures  . Basic metabolic panel  . Pro b natriuretic peptide (BNP)   No orders of the defined types were placed in this encounter.   Chief Complaint  Patient presents with  . Follow-up  . Atrial Fibrillation  . Anticoagulation    History of Present Illness:    Eric [name] is a 86 y.o. male with a hx of chronic atrial fibrillation with TIA on warfarin with a subtherapeutic INR nonanginal chest pain hypertension and COPD.  Echocardiogram performed March 2021 shows an EF of 60 to 65% moderate left and right atrial enlargement a myocardial perfusion study 09/28/2019 EF 67% with normal perfusion no ischemia.  Was last seen 03/14/2020.  He has had  trouble with falls unsteady gait and is struggling to take care of himself and his wife at home.  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  Fortunately there is 1 other son who is become involved in their care.  He thought about his anticoagulation is decided to stay with Eliquis.  He has had no further TIAs chest pain shortness of breath or edema.  He seems more composed in a better frame of mind at this visit. Past Medical History:  Diagnosis Date  . Abnormal liver enzymes 11/21/2017  . Anemia of unknown etiology 11/17/2017  . Atrial fibrillation ([place])   . Benign paroxysmal positional vertigo due to bilateral vestibular disorder 09/20/2019  . Blood transfusion without reported diagnosis   . C. difficile colitis 11/17/2017  . CAD (coronary artery disease)   . CEREBROVASCULAR ACCIDENT, HX OF 07/21/2007   Qualifier: Diagnosis of  By: Rogue Bussing CMA, Maryann Alar    . Chest pain in adult 07/28/2011  . CHEST PAIN UNSPECIFIED 03/20/2010   Qualifier: Diagnosis of  By: Burnice Logan  MD, Doretha Sou   . CHF (congestive heart failure) ([place])   . Chronic  anticoagulation 06/24/2018  . Chronic atrial fibrillation 07/23/2007   Qualifier: Diagnosis of  By: Burnice Logan  MD, Doretha Sou   . Chronic obstructive pulmonary disease ([place]) 07/01/2016  . CKD (chronic kidney disease) stage 3, GFR 30-59 ml/min 11/21/2017  . Congestive heart failure ([place]) 05/11/2010   Qualifier: Diagnosis of  By: Burnice Logan  MD, Doretha Sou   . Coronary artery disease involving native coronary artery of native heart without angina pectoris 10/23/2007     - Left ventricle: The cavity size was normal. Systolic function was     normal. The estimated ejection  fraction was in the range of 55% to     60%. Wall motion was normal; there were no regional wall motion     abnormalities.   - Left atrium: The atrium was mildly to moderately dilated.   - Right ventricle: The cavity size was mildly dilated.   - Right atrium: The atrium was mildly to moderately  . Elevated PSA 12/07/2012   Noted December 2013. Discussed with patient who does not desire further evaluation   . Essential hypertension 07/21/2007   Overview:  Overview:  Qualifier: Diagnosis of  By: Rogue Bussing CMA, Jacqualynn   Qualifier: Diagnosis of  By: Sherren Mocha MD, Jory Ee GERD 07/21/2007   Qualifier: Diagnosis of  By: Rogue Bussing CMA, Maryann Alar   Qualifier: Diagnosis of  By: Sherren Mocha MD, Jory Ee GERD (gastroesophageal reflux disease)   . GI bleed   . Hemoptysis 11/17/2017  . Herpes zoster without complication 11/25/5972  . Hyperlipidemia   . Hypertension   . Hypertensive heart disease 07/21/2007   Qualifier: Diagnosis of  By: Rogue Bussing CMA, Maryann Alar   Qualifier: Diagnosis of  By: Sherren Mocha MD, Jory Ee HYPOTENSION, ORTHOSTATIC 05/15/2010   Qualifier: Diagnosis of  By: Burnice Logan  MD, Doretha Sou   . Mixed hyperlipidemia 07/01/2016  . Moderate major depression ([place]) 03/15/2019  . Multiple pulmonary nodules 11/12/2016   Overview:  Seen on ct of chest 11/15/16. 69mm nodules in right major fissure and 66mm at minor fissure right lung.  57mm  nodule at left major fissure.   . Non-seasonal allergic rhinitis 09/06/2019  . NSAID-induced gastric ulcer   . Postherpetic neuralgia 02/04/2019  . SOB (shortness of breath) 12/10/2016  . Status post total left knee replacement 09/14/2015  . Thrombocytopenia ([place]) 12/25/2018  . Trigeminal neuralgia   . Type 2 diabetes mellitus with stage 3 chronic kidney disease, without long-term current use of insulin ([place]) 12/10/2017  . Unexplained weight loss 11/17/2017    Past Surgical History:  Procedure Laterality Date  . CHOLECYSTECTOMY    . ESOPHAGOGASTRODUODENOSCOPY  2011  .  FLEXIBLE SIGMOIDOSCOPY N/A 06/04/2013   Procedure: FLEXIBLE SIGMOIDOSCOPY;  Surgeon: Winfield Cunas., MD;  Location: [facility] ENDOSCOPY;  Service: Endoscopy;  Laterality: N/A;  . HIP SURGERY  August 2008   Right total hip replacement  . SHOULDER SURGERY     Left  . TOTAL KNEE ARTHROPLASTY  June 2009   Left    Current Medications: Current Meds  Medication Sig  . albuterol (PROAIR HFA) 108 (90 BASE) MCG/ACT inhaler Inhale 2 puffs into the lungs every 6 (six) hours as needed for wheezing or shortness of breath.  . ALPRAZolam (XANAX) 0.5 MG tablet Take one-half to one by mouth 8 hours prn rescue/anxiety.  Marland Kitchen apixaban (ELIQUIS) 5 MG TABS tablet TAKE 1 TABLET(5 MG) BY MOUTH TWICE DAILY  . aspirin EC 81 MG tablet Take 1 tablet (81 mg  total) by mouth daily.  Marland Kitchen atorvastatin (LIPITOR) 40 MG tablet TAKE 1 TABLET AT BEDTIME  . azithromycin (ZITHROMAX) 250 MG tablet   . bethanechol (URECHOLINE) 25 MG tablet Take 1 tablet by mouth 2 (two) times daily.  . Bimatoprost (LUMIGAN) 0.01 % SOLN Place 1 drop into both eyes at bedtime. Both eyes at hour of sleep  . bisacodyl (DULCOLAX) 5 MG EC tablet Take 1 tablet by mouth as needed.  . Blood Glucose Monitoring Suppl (GLUCOCOM BLOOD GLUCOSE MONITOR) DEVI Check blood sugar once a day  . brimonidine (ALPHAGAN) 0.2 % ophthalmic solution INSTILL ONE DROP IN BOTH EYES TWICE A DAY  . carvedilol (COREG) 3.125 MG tablet Take 3.125 mg by mouth 2 (two) times daily with a meal.  . Cholecalciferol (VITAMIN D3) 25 MCG (1000 UT) CAPS Take 1 capsule by mouth daily.  . clorazepate (TRANXENE) 3.75 MG tablet Take 1 tablet (3.75 mg total) by mouth 3 (three) times daily as needed for anxiety.  . cycloSPORINE (RESTASIS) 0.05 % ophthalmic emulsion Place 1 drop into both eyes 2 (two) times daily.   . Difluprednate (DUREZOL) 0.05 % EMUL Place 1 drop into both eyes daily.   Marland Kitchen docusate sodium (COLACE) 100 MG capsule Take 100 mg by mouth 2 (two) times daily.   Marland Kitchen escitalopram (LEXAPRO) 5 MG  tablet Take 1 tablet by mouth daily.  . fexofenadine (ALLEGRA) 180 MG tablet Take 180 mg by mouth daily.   . fluticasone (FLONASE) 50 MCG/ACT nasal spray   . furosemide (LASIX) 40 MG tablet Take one tablet by mouth on Sunday, Tuesday, Thursday, and Saturday. Take one tablet by mouth twice daily on Monday, Wednesday and Friday.  . gabapentin (NEURONTIN) 100 MG capsule Take 200 mg by mouth 2 (two) times daily.  . isosorbide mononitrate (IMDUR) 30 MG 24 hr tablet   . lidocaine (LIDODERM) 5 % 1 patch as directed.  . metoprolol succinate (TOPROL-XL) 25 MG 24 hr tablet Take 1 tablet (25 mg total) by mouth daily. Take with or immediately following a meal.  . multivitamin (THERAGRAN) per tablet Take 1 tablet by mouth daily.   . mupirocin ointment (BACTROBAN) 2 % Apply topically.  . nitroGLYCERIN (NITROSTAT) 0.4 MG SL tablet Place 0.4 mg under the tongue every 5 (five) minutes as needed for chest pain.  Marland Kitchen omeprazole (PRILOSEC) 20 MG capsule Take 20 mg by mouth daily.  . tamsulosin (FLOMAX) 0.4 MG CAPS capsule Take 0.4 mg by mouth daily.   . TRUE METRIX BLOOD GLUCOSE TEST test strip   . vitamin C (ASCORBIC ACID) 500 MG tablet Take 1,000 mg by mouth daily.      Allergies:   Enoxaparin sodium [enoxaparin sodium], Codeine, Oxycodone hcl, Simvastatin, Sulfa antibiotics, Sulfamethoxazole, Vicodin [hydrocodone-acetaminophen], Gemfibrozil, Sulfonamide derivatives, and Tramadol   Social History   Socioeconomic History  . Marital status: Married    Spouse name: Not on file  . Number of children: Not on file  . Years of education: Not on file  . Highest education level: Not on file  Occupational History  . Not on file  Tobacco Use  . Smoking status: Former Smoker    Quit date: 09/23/1958    Years since quitting: 61.7  . Smokeless tobacco: Former Systems developer  . Tobacco comment: quit smoking1980-quit chewing 6 months  Vaping Use  . Vaping Use: Never used  Substance and Sexual Activity  . Alcohol use: No  .  Drug use: No  . Sexual activity: Not on file  Other Topics Concern  .  Not on file  Social History Narrative  . Not on file   Social Determinants of Health   Financial Resource Strain:   . Difficulty of Paying Living Expenses: Not on file  Food Insecurity:   . Worried About Charity fundraiser in the Last Year: Not on file  . Ran Out of Food in the Last Year: Not on file  Transportation Needs:   . Lack of Transportation (Medical): Not on file  . Lack of Transportation (Non-Medical): Not on file  Physical Activity:   . Days of Exercise per Week: Not on file  . Minutes of Exercise per Session: Not on file  Stress:   . Feeling of Stress : Not on file  Social Connections:   . Frequency of Communication with Friends and Family: Not on file  . Frequency of Social Gatherings with Friends and Family: Not on file  . Attends Religious Services: Not on file  . Active Member of Clubs or Organizations: Not on file  . Attends Archivist Meetings: Not on file  . Marital Status: Not on file     Family History: The patient's family history includes Alzheimer's disease in his mother; Diabetes in his sister; Heart attack in his father. ROS:   Please see the history of present illness.    All other systems reviewed and are negative.  EKGs/Labs/Other Studies Reviewed:    The following studies were reviewed today:    Recent Labs: 09/02/2019: Hemoglobin 10.8; Platelets 104 12/08/2019: ALT 19 03/14/2020: BUN 20; Creatinine, Ser 0.96; NT-Pro BNP 2,636; Potassium 4.5; Sodium 142  Recent Lipid Panel    Component Value Date/Time   CHOL 88 (L) 12/08/2019 1343   TRIG 34 12/08/2019 1343   HDL 39 (L) 12/08/2019 1343   CHOLHDL 2.3 12/08/2019 1343   CHOLHDL 4 09/03/2012 0804   VLDL 25.2 09/03/2012 0804   LDLCALC 39 12/08/2019 1343    Physical Exam:    VS:  BP 110/64 (BP Location: Right Arm, Patient Position: Sitting, Cuff Size: Normal)   Pulse 66   Ht 6\' 2"  (1.88 m)   Wt 213 lb  (96.6 kg)   SpO2 96%   BMI 27.35 kg/m     Wt Readings from Last 3 Encounters:  06/14/20 213 lb (96.6 kg)  03/14/20 208 lb (94.3 kg)  12/08/19 217 lb 3.2 oz (98.5 kg)     GEN: He looks frail well nourished, well developed in no acute distress HEENT: Normal NECK: No JVD; No carotid bruits LYMPHATICS: No lymphadenopathy CARDIAC: Irregular rhythm S1 variable no murmurs, rubs, gallops RESPIRATORY:  Clear to auscultation without rales, wheezing or rhonchi  ABDOMEN: Soft, non-tender, non-distended MUSCULOSKELETAL: 1+ bilateral lower extremity pitting edema; No deformity  SKIN: Warm and dry NEUROLOGIC:  Alert and oriented x 3 PSYCHIATRIC:  Normal affect    Signed, Shirlee More, MD  06/14/2020 2:15 PM    [facility]

## 2020-06-14 ENCOUNTER — Other Ambulatory Visit: Payer: Self-pay

## 2020-06-14 ENCOUNTER — Ambulatory Visit: Payer: Medicare PPO | Admitting: Cardiology

## 2020-06-14 ENCOUNTER — Encounter: Payer: Self-pay | Admitting: Cardiology

## 2020-06-14 VITALS — BP 110/64 | HR 66 | Ht 74.0 in | Wt 213.0 lb

## 2020-06-14 DIAGNOSIS — Z7901 Long term (current) use of anticoagulants: Secondary | ICD-10-CM | POA: Diagnosis not present

## 2020-06-14 DIAGNOSIS — I110 Hypertensive heart disease with heart failure: Secondary | ICD-10-CM

## 2020-06-14 DIAGNOSIS — I482 Chronic atrial fibrillation, unspecified: Secondary | ICD-10-CM

## 2020-06-14 DIAGNOSIS — I472 Ventricular tachycardia: Secondary | ICD-10-CM

## 2020-06-14 DIAGNOSIS — I4729 Other ventricular tachycardia: Secondary | ICD-10-CM

## 2020-06-14 DIAGNOSIS — E782 Mixed hyperlipidemia: Secondary | ICD-10-CM

## 2020-06-14 DIAGNOSIS — I5032 Chronic diastolic (congestive) heart failure: Secondary | ICD-10-CM

## 2020-06-14 NOTE — Patient Instructions (Signed)
Medication Instructions:  Your physician recommends that you continue on your current medications as directed. Please refer to the Current Medication list given to you today.  *If you need a refill on your cardiac medications before your next appointment, please call your pharmacy*   Lab Work: Your physician recommends that you return for lab work today: bmp, pro bnp  If you have labs (blood work) drawn today and your tests are completely normal, you will receive your results only by: Marland Kitchen MyChart Message (if you have MyChart) OR . A paper copy in the mail If you have any lab test that is abnormal or we need to change your treatment, we will call you to review the results.   Testing/Procedures: None.    Follow-Up: At [facility], you and your health needs are our priority.  As part of our continuing mission to provide you with exceptional heart care, we have created designated Provider Care Teams.  These Care Teams include your primary Cardiologist (physician) and Advanced Practice Providers (APPs -  Physician Assistants and Nurse Practitioners) who all work together to provide you with the care you need, when you need it.  We recommend signing up for the patient portal called "MyChart".  Sign up information is provided on this After Visit Summary.  MyChart is used to connect with patients for Virtual Visits (Telemedicine).  Patients are able to view lab/test results, encounter notes, upcoming appointments, etc.  Non-urgent messages can be sent to your provider as well.   To learn more about what you can do with MyChart, go to [email or web address].    Your next appointment:   6 month(s)  The format for your next appointment:   In Person  Provider:   Shirlee More, MD   Other Instructions

## 2020-06-15 LAB — BASIC METABOLIC PANEL
BUN/Creatinine Ratio: 16 (ref 10–24)
BUN: 14 mg/dL (ref 8–27)
CO2: 29 mmol/L (ref 20–29)
Calcium: 8.8 mg/dL (ref 8.6–10.2)
Chloride: 103 mmol/L (ref 96–106)
Creatinine, Ser: 0.85 mg/dL (ref 0.76–1.27)
GFR calc Af Amer: 91 mL/min/{1.73_m2} (ref >59)
GFR calc non Af Amer: 79 mL/min/{1.73_m2} (ref >59)
Glucose: 131 mg/dL — ABNORMAL HIGH (ref 65–99)
Potassium: 3.7 mmol/L (ref 3.5–5.2)
Sodium: 144 mmol/L (ref 134–144)

## 2020-06-15 LAB — PRO B NATRIURETIC PEPTIDE: NT-Pro BNP: 2415 pg/mL — ABNORMAL HIGH (ref 0–486)

## 2020-06-19 NOTE — Telephone Encounter (Signed)
Patient is calling back wanting to talk back with Dr. Bettina Gavia or nurse. Please call back

## 2020-06-19 NOTE — Telephone Encounter (Signed)
Spoke to patient just now and let him know Dr. Joya Gaskins recommendation again as he was calling in regards to the same issue. Patient verbalizes understanding and thanks me for the call back.

## 2020-06-22 ENCOUNTER — Telehealth: Payer: Self-pay | Admitting: Cardiology

## 2020-06-22 NOTE — Telephone Encounter (Signed)
Pt c/o medication issue:  1. Name of Medication: apixaban (ELIQUIS) 5 MG TABS tablet  2. How are you currently taking this medication (dosage and times per day)? 1 tablet by mouth twice daily   3. Are you having a reaction (difficulty breathing--STAT)? No   4. What is your medication issue? Eric [name] is calling stating his Eliquis this month cost $131.42. He is wanting to know if there's something that can be done for the price to be lowered or if an alternative medication can be prescribed. Please advise.

## 2020-06-22 NOTE — Telephone Encounter (Signed)
Spoke to the patient just now and let him know that we could bridge him over to warfarin as discussed several weeks ago. He states that he does not want to do this and will continue the Eliquis. I offered patient assistance forms and he states that he will not be able to get any help from patient assistance.    Encouraged patient to call back with any questions or concerns.

## 2020-06-28 ENCOUNTER — Telehealth: Payer: Self-pay | Admitting: Cardiology

## 2020-06-28 NOTE — Telephone Encounter (Signed)
Spoke to the patient just now and he let me know that he has been feeling dizzy for the past 3 to 4 days after taking his medications. He states that this is happening about 30 minutes after taking his morning medications. He says that he will be dizzy for about 4 hours after taking his medications and then he will go back to feeling normal. He denies any shortness of breath, chest pain, or any other symptoms. His current blood pressure is 124/73 right now and his heart rate is at 53 bpm.   He states that he does not feel dizzy right now but he has not taken his morning medications. He tells me that he just got up a couple of minutes ago.   I will route this to Dr. Harriet Masson for further recommendations.

## 2020-06-28 NOTE — Telephone Encounter (Signed)
STAT if patient feels like he/she is going to faint   1) Are you dizzy now? No, occurs after taking medication and hasn't taken it yet  2) Do you feel faint or have you passed out? Feels faint, hasn't passed out   3) Do you have any other symptoms? No   4) Have you checked your HR and BP (record if available)? Has been normal   Eric [name] is calling stating he has been having dizziness for the past 3 to 4 days after taking his medication. He states it last for about half the day if not more. Please advise.

## 2020-06-29 NOTE — Telephone Encounter (Signed)
Spoke to the patient just now and asked him if we could go over his medication list again. He states that he is busy right now but will call me back to go over all of his medications.

## 2020-06-29 NOTE — Telephone Encounter (Signed)
Please check with the patient and go over his medicines I am confused as he does have 2 beta-blockers on his medication regimen less make sure of what he is taking and then we can guide him appropriately.

## 2020-06-30 NOTE — Addendum Note (Signed)
Addended by: Resa Miner I on: 06/30/2020 02:06 PM   Modules accepted: Orders

## 2020-06-30 NOTE — Telephone Encounter (Signed)
Spoke to the patient just now and got his medication list updated. He is not currently taking his carvedilol. His PCP saw him yesterday and started him on meclizine to help with the dizziness. He states that he is feeling better and that he will call us back if he were to need anything.    Encouraged patient to call back with any questions or concerns.

## 2020-07-03 ENCOUNTER — Telehealth: Payer: Self-pay | Admitting: *Deleted

## 2020-07-03 NOTE — Telephone Encounter (Signed)
Called pt to remind him of samples here for Eliquis 5mg  and form for financial assitance that he can fill out to get help with the Eliquis. Michela Pitcher his son would come by and pick it up today. Put them up front.

## 2020-07-14 ENCOUNTER — Other Ambulatory Visit: Payer: Self-pay | Admitting: Cardiology

## 2020-08-31 ENCOUNTER — Encounter: Payer: Self-pay | Admitting: Podiatry

## 2020-08-31 ENCOUNTER — Ambulatory Visit: Payer: Medicare PPO | Admitting: Podiatry

## 2020-08-31 ENCOUNTER — Other Ambulatory Visit: Payer: Self-pay

## 2020-08-31 DIAGNOSIS — M79676 Pain in unspecified toe(s): Secondary | ICD-10-CM

## 2020-08-31 DIAGNOSIS — D689 Coagulation defect, unspecified: Secondary | ICD-10-CM

## 2020-08-31 DIAGNOSIS — I739 Peripheral vascular disease, unspecified: Secondary | ICD-10-CM

## 2020-08-31 DIAGNOSIS — B351 Tinea unguium: Secondary | ICD-10-CM

## 2020-09-05 ENCOUNTER — Other Ambulatory Visit: Payer: Self-pay

## 2020-09-05 NOTE — Progress Notes (Signed)
Subjective:  Patient ID: Eric [name], male    DOB: 06/08/1931,  MRN: [id number]  Eric [name] presents to clinic today for for at risk foot care. Patient has h/o PAD and painful thick toenails that are difficult to trim. Pain interferes with ambulation. Aggravating factors include wearing enclosed shoe gear. Pain is relieved with periodic professional debridement..  86 y.o. male presents with the above complaint.    PCP is Dr. Raina Mina. Last visit was 2 weeks ago.  Review of Systems: Negative except as noted in the HPI. Past Medical History:  Diagnosis Date  . Abnormal liver enzymes 11/21/2017  . Anemia of unknown etiology 11/17/2017  . Atrial fibrillation ([place])   . Benign paroxysmal positional vertigo due to bilateral vestibular disorder 09/20/2019  . Blood transfusion without reported diagnosis   . C. difficile colitis 11/17/2017  . CAD (coronary artery disease)   . CEREBROVASCULAR ACCIDENT, HX OF 07/21/2007   Qualifier: Diagnosis of  By: Rogue Bussing CMA, Maryann Alar    . Chest pain in adult 07/28/2011  . CHEST PAIN UNSPECIFIED 03/20/2010   Qualifier: Diagnosis of  By: Burnice Logan  MD, Doretha Sou   . CHF (congestive heart failure) ([employer])   . Chronic anticoagulation 06/24/2018  . Chronic atrial fibrillation 07/23/2007   Qualifier: Diagnosis of  By: Burnice Logan  MD, Doretha Sou   . Chronic obstructive pulmonary disease ([place]) 07/01/2016  . CKD (chronic kidney disease) stage 3, GFR 30-59 ml/min (HCC) 11/21/2017  . Congestive heart failure ([place]) 05/11/2010   Qualifier: Diagnosis of  By: Burnice Logan  MD, Doretha Sou   . Coronary artery disease involving native coronary artery of native heart without angina pectoris 10/23/2007     - Left ventricle: The cavity size was normal. Systolic function was     normal. The estimated ejection fraction was in the range of 55% to     60%. Wall motion was normal; there were no regional wall motion     abnormalities.   - Left atrium: The atrium was mildly to  moderately dilated.   - Right ventricle: The cavity size was mildly dilated.   - Right atrium: The atrium was mildly to moderately  . Elevated PSA 12/07/2012   Noted December 2013. Discussed with patient who does not desire further evaluation   . Essential hypertension 07/21/2007   Overview:  Overview:  Qualifier: Diagnosis of  By: Rogue Bussing CMA, Jacqualynn   Qualifier: Diagnosis of  By: Sherren Mocha MD, Jory Ee GERD 07/21/2007   Qualifier: Diagnosis of  By: Rogue Bussing CMA, Maryann Alar   Qualifier: Diagnosis of  By: Sherren Mocha MD, Jory Ee GERD (gastroesophageal reflux disease)   . GI bleed   . Hemoptysis 11/17/2017  . Herpes zoster without complication 00/11/4915  . Hyperlipidemia   . Hypertension   . Hypertensive heart disease 07/21/2007   Qualifier: Diagnosis of  By: Rogue Bussing CMA, Maryann Alar   Qualifier: Diagnosis of  By: Sherren Mocha MD, Jory Ee HYPOTENSION, ORTHOSTATIC 05/15/2010   Qualifier: Diagnosis of  By: Burnice Logan  MD, Doretha Sou   . Mixed hyperlipidemia 07/01/2016  . Moderate major depression ([place]) 03/15/2019  . Multiple pulmonary nodules 11/12/2016   Overview:  Seen on ct of chest 11/15/16. 41mm nodules in right major fissure and 36mm at minor fissure right lung.  68mm  nodule at left major fissure.   . Non-seasonal allergic rhinitis 09/06/2019  . NSAID-induced gastric ulcer   . Postherpetic neuralgia 02/04/2019  . SOB (shortness of breath) 12/10/2016  .  Status post total left knee replacement 09/14/2015  . Thrombocytopenia ([place]) 12/25/2018  . Trigeminal neuralgia   . Type 2 diabetes mellitus with stage 3 chronic kidney disease, without long-term current use of insulin ([place]) 12/10/2017  . Unexplained weight loss 11/17/2017   Past Surgical History:  Procedure Laterality Date  . CHOLECYSTECTOMY    . ESOPHAGOGASTRODUODENOSCOPY  2011  . FLEXIBLE SIGMOIDOSCOPY N/A 06/04/2013   Procedure: FLEXIBLE SIGMOIDOSCOPY;  Surgeon: Winfield Cunas., MD;  Location: [facility] ENDOSCOPY;  Service: Endoscopy;   Laterality: N/A;  . HIP SURGERY  August 2008   Right total hip replacement  . SHOULDER SURGERY     Left  . TOTAL KNEE ARTHROPLASTY  June 2009   Left    Current Outpatient Medications:  .  mupirocin ointment (BACTROBAN) 2 %, Apply topically., Disp: , Rfl:  .  albuterol (PROAIR HFA) 108 (90 BASE) MCG/ACT inhaler, Inhale 2 puffs into the lungs every 6 (six) hours as needed for wheezing or shortness of breath., Disp: 1 Inhaler, Rfl: 5 .  ALPRAZolam (XANAX) 0.5 MG tablet, Take one-half to one by mouth 8 hours prn rescue/anxiety., Disp: , Rfl:  .  aspirin EC 81 MG tablet, Take 1 tablet (81 mg total) by mouth daily., Disp: 90 tablet, Rfl: 3 .  atorvastatin (LIPITOR) 40 MG tablet, TAKE 1 TABLET AT BEDTIME, Disp: 90 tablet, Rfl: 1 .  bethanechol (URECHOLINE) 25 MG tablet, Take 1 tablet by mouth 2 (two) times daily., Disp: , Rfl:  .  Bimatoprost (LUMIGAN) 0.01 % SOLN, Place 1 drop into both eyes at bedtime. Both eyes at hour of sleep, Disp: , Rfl:  .  bisacodyl (DULCOLAX) 5 MG EC tablet, Take 1 tablet by mouth as needed., Disp: , Rfl:  .  Blood Glucose Monitoring Suppl (GLUCOCOM BLOOD GLUCOSE MONITOR) DEVI, Check blood sugar once a day, Disp: , Rfl:  .  brimonidine (ALPHAGAN) 0.2 % ophthalmic solution, INSTILL ONE DROP IN BOTH EYES TWICE A DAY, Disp: , Rfl:  .  Cholecalciferol (VITAMIN D3) 25 MCG (1000 UT) CAPS, Take 1 capsule by mouth daily., Disp: , Rfl:  .  ELIQUIS 5 MG TABS tablet, TAKE 1 TABLET(5 MG) BY MOUTH TWICE DAILY, Disp: 180 tablet, Rfl: 1 .  escitalopram (LEXAPRO) 5 MG tablet, Take 1 tablet by mouth daily., Disp: , Rfl: 5 .  fluticasone (FLONASE) 50 MCG/ACT nasal spray, , Disp: , Rfl:  .  furosemide (LASIX) 40 MG tablet, Take one tablet by mouth on Sunday, Tuesday, Thursday, and Saturday. Take one tablet by mouth twice daily on Monday, Wednesday and Friday., Disp: 120 tablet, Rfl: 3 .  gabapentin (NEURONTIN) 100 MG capsule, Take 200 mg by mouth 2 (two) times daily., Disp: , Rfl:  .   isosorbide mononitrate (IMDUR) 30 MG 24 hr tablet, , Disp: , Rfl:  .  meclizine (ANTIVERT) 25 MG tablet, Take 25 mg by mouth daily., Disp: , Rfl:  .  metoprolol succinate (TOPROL-XL) 25 MG 24 hr tablet, Take 1 tablet (25 mg total) by mouth daily. Take with or immediately following a meal., Disp: 90 tablet, Rfl: 3 .  multivitamin (THERAGRAN) per tablet, Take 1 tablet by mouth daily. , Disp: , Rfl:  .  nitroGLYCERIN (NITROSTAT) 0.4 MG SL tablet, Place 0.4 mg under the tongue every 5 (five) minutes as needed for chest pain., Disp: , Rfl:  .  omeprazole (PRILOSEC) 20 MG capsule, Take 20 mg by mouth daily., Disp: , Rfl:  .  PRAMOXINE-CAMPHOR-ZINC ACETATE EX, Apply topically., Disp: ,  Rfl:  .  tamsulosin (FLOMAX) 0.4 MG CAPS capsule, Take 0.4 mg by mouth daily. , Disp: , Rfl:  .  TRUE METRIX BLOOD GLUCOSE TEST test strip, , Disp: , Rfl:  .  TRUEplus Lancets 33G MISC, , Disp: , Rfl:  .  vitamin C (ASCORBIC ACID) 500 MG tablet, Take 1,000 mg by mouth daily. , Disp: , Rfl:  Allergies  Allergen Reactions  . Enoxaparin Sodium [Enoxaparin Sodium] Anaphylaxis and Other (See Comments)    angioedema  . Codeine Other (See Comments)    hallucinations hallucinations  . Oxycodone Hcl Other (See Comments)    hallucinations  . Simvastatin Other (See Comments)  . Sulfa Antibiotics Other (See Comments)    Unknown.  . Sulfamethoxazole Other (See Comments)    Unknown.  . Vicodin [Hydrocodone-Acetaminophen] Other (See Comments)    hallucinations  . Gemfibrozil Rash  . Sulfonamide Derivatives Rash  . Tramadol Rash   Social History   Occupational History  . Not on file  Tobacco Use  . Smoking status: Former Smoker    Quit date: 09/23/1958    Years since quitting: 61.9  . Smokeless tobacco: Former Systems developer  . Tobacco comment: quit smoking1980-quit chewing 6 months  Vaping Use  . Vaping Use: Never used  Substance and Sexual Activity  . Alcohol use: No  . Drug use: No  . Sexual activity: Not on file     Objective:   Constitutional Verdie E Wangerin is a pleasant 86 y.o. Caucasian male, in NAD. AAO x 3.   Vascular Capillary fill time to digits <3 seconds b/l lower extremities. Faintly palpable pedal pulses b/l. Pedal hair sparse. Lower extremity skin temperature gradient within normal limits. Varicosities present b/l. No cyanosis or clubbing noted.  Neurologic Normal speech. Oriented to person, place, and time. Protective sensation intact 5/5 intact bilaterally with 10g monofilament b/l.  Dermatologic Pedal skin with normal turgor, texture and tone bilaterally. No open wounds bilaterally. No interdigital macerations bilaterally. Toenails 1-5 b/l elongated, discolored, dystrophic, thickened, crumbly with subungual debris and tenderness to dorsal palpation.  Orthopedic: Normal muscle strength 5/5 to all lower extremity muscle groups bilaterally. No pain crepitus or joint limitation noted with ROM b/l. No gross bony deformities bilaterally.   Radiographs: None Assessment:   1. Pain due to onychomycosis of toenail   2. Coagulation defect ([place])   3. PVD (peripheral vascular disease) ([place])    Plan:  Patient was evaluated and treated and all questions answered.  Onychomycosis with pain -Nails palliatively debridement as below -Educated on self-care  Procedure: Nail Debridement Rationale: Pain Type of Debridement: manual, sharp debridement. Instrumentation: Nail nipper, rotary burr. Number of Nails: 10 -Examined patient. -No new findings. No new orders. -Patient to continue soft, supportive shoe gear daily. -Toenails 1-5 b/l were debrided in length and girth with sterile nail nippers and dremel without iatrogenic bleeding.  -Patient to report any pedal injuries to medical professional immediately. -Patient/POA to call should there be question/concern in the interim.  No follow-ups on file.  Marzetta Board, DPM

## 2020-09-06 MED ORDER — APIXABAN 5 MG PO TABS
ORAL_TABLET | ORAL | 1 refills | Status: DC
Start: 2020-09-06 — End: 2020-09-08

## 2020-09-06 NOTE — Telephone Encounter (Signed)
Prescription refill request for Eliquis received. Indication: TIA Last office visit: 05/2020  [facility] Scr: 0.85 05/2020 Age: 86 Weight:96.6 kg  Prescription refilled

## 2020-09-08 ENCOUNTER — Telehealth: Payer: Self-pay | Admitting: Cardiology

## 2020-09-08 MED ORDER — APIXABAN 5 MG PO TABS
ORAL_TABLET | ORAL | 1 refills | Status: DC
Start: 2020-09-08 — End: 2020-09-19

## 2020-09-08 NOTE — Telephone Encounter (Signed)
Refill sent in per request.  

## 2020-09-08 NOTE — Telephone Encounter (Signed)
*  STAT* If patient is at the pharmacy, call can be transferred to refill team.   1. Which medications need to be refilled? (please list name of each medication and dose if known) Eliquis 5 mg  2. Which pharmacy/location (including street and city if local pharmacy) is medication to be sent to? [place] [phone] fax number  3. Do they need a 30 day or 90 day supply? 90 and 30

## 2020-09-19 ENCOUNTER — Telehealth: Payer: Self-pay | Admitting: Cardiology

## 2020-09-19 MED ORDER — APIXABAN 5 MG PO TABS
ORAL_TABLET | ORAL | 3 refills | Status: DC
Start: 2020-09-19 — End: 2020-09-21

## 2020-09-19 NOTE — Telephone Encounter (Signed)
     Pt c/o medication issue:  1. Name of Medication:   apixaban (ELIQUIS) 5 MG TABS tablet    2. How are you currently taking this medication (dosage and times per day)? TAKE 1 TABLET(5 MG) BY MOUTH TWICE DAILY  3. Are you having a reaction (difficulty breathing--STAT)?   4. What is your medication issue? Pt said to fax prescription for his eliquis to Dr. Vivianne Spence at [facility] for refill. He gave fax# [phone]

## 2020-09-19 NOTE — Telephone Encounter (Signed)
Prescription printed and placed on Dr. Hulen Shouts desk to sign and will be faxed once signed.

## 2020-09-20 ENCOUNTER — Telehealth: Payer: Self-pay | Admitting: Cardiology

## 2020-09-20 NOTE — Telephone Encounter (Signed)
    Pt c/o swelling: STAT is pt has developed SOB within 24 hours  1) How much weight have you gained and in what time span?   2) If swelling, where is the swelling located? Left Leg  3) Are you currently taking a fluid pill? Yes  4) Are you currently SOB? No  5) Do you have a log of your daily weights (if so, list)? none  6) Have you gained 3 pounds in a day or 5 pounds in a week? None  7) Have you traveled recently? No   Pt said his having swelling, denied SOB or other symptoms but he is concerned that he might developed blood clot and wanted to be seen today

## 2020-09-20 NOTE — Telephone Encounter (Signed)
Returned patients call, he  states he just wanted to let us know he has an appointment with his PCP today at 2:00.

## 2020-09-20 NOTE — Telephone Encounter (Signed)
follow up:     Patient states he has apt with his PCP today.

## 2020-09-20 NOTE — Telephone Encounter (Signed)
Tried calling patient. No answer and no voicemail set up for me to leave a message. 

## 2020-09-21 ENCOUNTER — Telehealth: Payer: Self-pay | Admitting: Cardiology

## 2020-09-21 MED ORDER — APIXABAN 5 MG PO TABS
ORAL_TABLET | ORAL | 3 refills | Status: DC
Start: 2020-09-21 — End: 2021-02-26

## 2020-09-21 NOTE — Telephone Encounter (Signed)
Tried calling patient. No answer and no voicemail set up for me to leave a message. 

## 2020-09-21 NOTE — Telephone Encounter (Signed)
Refill will be faxed upon Dr. Hulen Shouts return to sign it. Refill has also been sent through Epic at this time.

## 2020-09-21 NOTE — Telephone Encounter (Signed)
*  STAT* If patient is at the pharmacy, call can be transferred to refill team.   1. Which medications need to be refilled? (please list name of each medication and dose if known) apixaban (ELIQUIS) 5 MG TABS tablet  2. Which pharmacy/location (including street and city if local pharmacy) is medication to be sent to? [facility], Fax number: [phone]  3. Do they need a 30 day or 90 day supply? 90

## 2020-09-25 NOTE — Telephone Encounter (Signed)
Tried calling patient. No answer and no voicemail set up for me to leave a message. 

## 2020-11-09 ENCOUNTER — Ambulatory Visit: Payer: Medicare PPO | Admitting: Podiatry

## 2020-11-30 ENCOUNTER — Ambulatory Visit: Payer: Medicare PPO | Admitting: Podiatry

## 2020-12-07 DIAGNOSIS — G500 Trigeminal neuralgia: Secondary | ICD-10-CM | POA: Insufficient documentation

## 2020-12-07 DIAGNOSIS — Z5189 Encounter for other specified aftercare: Secondary | ICD-10-CM | POA: Insufficient documentation

## 2020-12-07 DIAGNOSIS — I4891 Unspecified atrial fibrillation: Secondary | ICD-10-CM | POA: Insufficient documentation

## 2020-12-07 DIAGNOSIS — I2510 Atherosclerotic heart disease of native coronary artery without angina pectoris: Secondary | ICD-10-CM | POA: Insufficient documentation

## 2020-12-07 DIAGNOSIS — K219 Gastro-esophageal reflux disease without esophagitis: Secondary | ICD-10-CM | POA: Insufficient documentation

## 2020-12-07 DIAGNOSIS — K922 Gastrointestinal hemorrhage, unspecified: Secondary | ICD-10-CM | POA: Insufficient documentation

## 2020-12-07 DIAGNOSIS — I10 Essential (primary) hypertension: Secondary | ICD-10-CM | POA: Insufficient documentation

## 2020-12-07 DIAGNOSIS — E785 Hyperlipidemia, unspecified: Secondary | ICD-10-CM | POA: Insufficient documentation

## 2020-12-07 DIAGNOSIS — K259 Gastric ulcer, unspecified as acute or chronic, without hemorrhage or perforation: Secondary | ICD-10-CM | POA: Insufficient documentation

## 2020-12-07 DIAGNOSIS — I509 Heart failure, unspecified: Secondary | ICD-10-CM | POA: Insufficient documentation

## 2020-12-13 ENCOUNTER — Ambulatory Visit: Payer: Medicare PPO | Admitting: Cardiology

## 2020-12-13 NOTE — Progress Notes (Deleted)
Cardiology Office Note:    Date:  12/13/2020   ID:  Eric [name], DOB 1930/11/09, MRN [id number]  PCP:  Raina [name]., MD  Cardiologist:  Shirlee More, MD    Referring MD: Raina [name]., MD    ASSESSMENT:    No diagnosis found. PLAN:    In order of problems listed above:  1. ***   Next appointment: ***   Medication Adjustments/Labs and Tests Ordered: Current medicines are reviewed at length with the patient today.  Concerns regarding medicines are outlined above.  No orders of the defined types were placed in this encounter.  No orders of the defined types were placed in this encounter.   No chief complaint on file.   History of Present Illness:    Eric [name] is a 87 y.o. male with a hx of chronic atrial fibrillation with TIA on warfarin with a subtherapeutic INR nonanginal chest pain hypertension and COPD.  Echocardiogram performed March 2021 shows an EF of 60 to 65% moderate left and right atrial enlargement a myocardial perfusion study 09/28/2019 EF 67% with normal perfusion no ischemia  last seen 06/14/2020. Compliance with diet, lifestyle and medications: *** Past Medical History:  Diagnosis Date  . Abnormal liver enzymes 11/21/2017  . Anemia of unknown etiology 11/17/2017  . Atrial fibrillation ([place])   . Benign paroxysmal positional vertigo due to bilateral vestibular disorder 09/20/2019  . Blood transfusion without reported diagnosis   . C. difficile colitis 11/17/2017  . CAD (coronary artery disease)   . CEREBROVASCULAR ACCIDENT, HX OF 07/21/2007   Qualifier: Diagnosis of  By: Rogue Bussing CMA, Maryann Alar    . Chest pain in adult 07/28/2011  . CHEST PAIN UNSPECIFIED 03/20/2010   Qualifier: Diagnosis of  By: Burnice Logan  MD, Doretha Sou   . CHF (congestive heart failure) ([place])   . Chronic anticoagulation 06/24/2018  . Chronic atrial fibrillation 07/23/2007   Qualifier: Diagnosis of  By: Burnice Logan  MD, Doretha Sou   . Chronic obstructive pulmonary disease  ([place]) 07/01/2016  . CKD (chronic kidney disease) stage 3, GFR 30-59 ml/min (HCC) 11/21/2017  . Congestive heart failure ([place]) 05/11/2010   Qualifier: Diagnosis of  By: Burnice Logan  MD, Doretha Sou   . Coronary artery disease involving native coronary artery of native heart without angina pectoris 10/23/2007     - Left ventricle: The cavity size was normal. Systolic function was     normal. The estimated ejection fraction was in the range of 55% to     60%. Wall motion was normal; there were no regional wall motion     abnormalities.   - Left atrium: The atrium was mildly to moderately dilated.   - Right ventricle: The cavity size was mildly dilated.   - Right atrium: The atrium was mildly to moderately  . Elevated PSA 12/07/2012   Noted December 2013. Discussed with patient who does not desire further evaluation   . Essential hypertension 07/21/2007   Overview:  Overview:  Qualifier: Diagnosis of  By: Rogue Bussing CMA, Jacqualynn   Qualifier: Diagnosis of  By: Sherren Mocha MD, Jory Ee GERD 07/21/2007   Qualifier: Diagnosis of  By: Rogue Bussing CMA, Maryann Alar   Qualifier: Diagnosis of  By: Sherren Mocha MD, Jory Ee GERD (gastroesophageal reflux disease)   . GI bleed   . Hemoptysis 11/17/2017  . Herpes zoster without complication 28/12/1322  . Hyperlipidemia   . Hypertension   . Hypertensive heart disease 07/21/2007   Qualifier: Diagnosis of  By: Rogue Bussing CMA, Maryann Alar   Qualifier: Diagnosis of  By: Sherren Mocha MD, Jory Ee HYPOTENSION, ORTHOSTATIC 05/15/2010   Qualifier: Diagnosis of  By: Burnice Logan  MD, Doretha Sou   . Mixed hyperlipidemia 07/01/2016  . Moderate major depression ([place]) 03/15/2019  . Multiple pulmonary nodules 11/12/2016   Overview:  Seen on ct of chest 11/15/16. 57mm nodules in right major fissure and 44mm at minor fissure right lung.  77mm  nodule at left major fissure.   . Non-seasonal allergic rhinitis 09/06/2019  . NSAID-induced gastric ulcer   . Postherpetic neuralgia 02/04/2019  . SOB (shortness of  breath) 12/10/2016  . Status post total left knee replacement 09/14/2015  . Thrombocytopenia ([place]) 12/25/2018  . Trigeminal neuralgia   . Type 2 diabetes mellitus with stage 3 chronic kidney disease, without long-term current use of insulin ([place]) 12/10/2017  . Unexplained weight loss 11/17/2017    Past Surgical History:  Procedure Laterality Date  . CHOLECYSTECTOMY    . ESOPHAGOGASTRODUODENOSCOPY  2011  . FLEXIBLE SIGMOIDOSCOPY N/A 06/04/2013   Procedure: FLEXIBLE SIGMOIDOSCOPY;  Surgeon: Winfield Cunas., MD;  Location: [facility] ENDOSCOPY;  Service: Endoscopy;  Laterality: N/A;  . HIP SURGERY  August 2008   Right total hip replacement  . SHOULDER SURGERY     Left  . TOTAL KNEE ARTHROPLASTY  June 2009   Left    Current Medications: No outpatient medications have been marked as taking for the 12/13/20 encounter (Appointment) with Eric Priest, MD.     Allergies:   Enoxaparin sodium [enoxaparin sodium], Codeine, Oxycodone hcl, Simvastatin, Sulfa antibiotics, Sulfamethoxazole, Vicodin [hydrocodone-acetaminophen], Gemfibrozil, Sulfonamide derivatives, and Tramadol   Social History   Socioeconomic History  . Marital status: Married    Spouse name: Not on file  . Number of children: Not on file  . Years of education: Not on file  . Highest education level: Not on file  Occupational History  . Not on file  Tobacco Use  . Smoking status: Former Smoker    Quit date: 09/23/1958    Years since quitting: 62.2  . Smokeless tobacco: Former Systems developer  . Tobacco comment: quit smoking1980-quit chewing 6 months  Vaping Use  . Vaping Use: Never used  Substance and Sexual Activity  . Alcohol use: No  . Drug use: No  . Sexual activity: Not on file  Other Topics Concern  . Not on file  Social History Narrative  . Not on file   Social Determinants of Health   Financial Resource Strain: Not on file  Food Insecurity: Not on file  Transportation Needs: Not on file  Physical Activity: Not on file   Stress: Not on file  Social Connections: Not on file     Family History: The patient's ***family history includes Alzheimer's disease in his mother; Diabetes in his sister; Heart attack in his father. ROS:   Please see the history of present illness.    All other systems reviewed and are negative.  EKGs/Labs/Other Studies Reviewed:    The following studies were reviewed today:  EKG:  EKG ordered today and personally reviewed.  The ekg ordered today demonstrates ***  Recent Labs: 06/14/2020: BUN 14; Creatinine, Ser 0.85; NT-Pro BNP 2,415; Potassium 3.7; Sodium 144  Recent Lipid Panel    Component Value Date/Time   CHOL 88 (L) 12/08/2019 1343   TRIG 34 12/08/2019 1343   HDL 39 (L) 12/08/2019 1343   CHOLHDL 2.3 12/08/2019 1343   CHOLHDL 4 09/03/2012 0804  VLDL 25.2 09/03/2012 0804   LDLCALC 39 12/08/2019 1343    Physical Exam:    VS:  There were no vitals taken for this visit.    Wt Readings from Last 3 Encounters:  06/14/20 213 lb (96.6 kg)  03/14/20 208 lb (94.3 kg)  12/08/19 217 lb 3.2 oz (98.5 kg)     GEN: *** Well nourished, well developed in no acute distress HEENT: Normal NECK: No JVD; No carotid bruits LYMPHATICS: No lymphadenopathy CARDIAC: ***RRR, no murmurs, rubs, gallops RESPIRATORY:  Clear to auscultation without rales, wheezing or rhonchi  ABDOMEN: Soft, non-tender, non-distended MUSCULOSKELETAL:  No edema; No deformity  SKIN: Warm and dry NEUROLOGIC:  Alert and oriented x 3 PSYCHIATRIC:  Normal affect    Signed, Shirlee More, MD  12/13/2020 11:35 AM    [place]

## 2020-12-25 ENCOUNTER — Other Ambulatory Visit: Payer: Self-pay | Admitting: Cardiology

## 2020-12-25 NOTE — Telephone Encounter (Signed)
Metoprolol approved and sent 

## 2021-02-14 ENCOUNTER — Encounter: Payer: Self-pay | Admitting: Cardiology

## 2021-02-14 ENCOUNTER — Ambulatory Visit: Payer: Medicare Other | Admitting: Cardiology

## 2021-02-14 ENCOUNTER — Other Ambulatory Visit: Payer: Self-pay

## 2021-02-14 VITALS — BP 120/58 | HR 62 | Ht 74.0 in | Wt 209.0 lb

## 2021-02-14 DIAGNOSIS — E782 Mixed hyperlipidemia: Secondary | ICD-10-CM

## 2021-02-14 DIAGNOSIS — I482 Chronic atrial fibrillation, unspecified: Secondary | ICD-10-CM

## 2021-02-14 DIAGNOSIS — I110 Hypertensive heart disease with heart failure: Secondary | ICD-10-CM

## 2021-02-14 DIAGNOSIS — I5032 Chronic diastolic (congestive) heart failure: Secondary | ICD-10-CM

## 2021-02-14 DIAGNOSIS — Z7901 Long term (current) use of anticoagulants: Secondary | ICD-10-CM

## 2021-02-14 DIAGNOSIS — D696 Thrombocytopenia, unspecified: Secondary | ICD-10-CM

## 2021-02-14 NOTE — Progress Notes (Signed)
Cardiology Office Note:    Date:  02/14/2021   ID:  Eric [name], DOB [birth year], MRN [id number]  PCP:  Raina Mina., MD  Cardiologist:  Shirlee More, MD    Referring MD: Raina Mina., MD    ASSESSMENT:    1. Chronic atrial fibrillation ([place])   2. Chronic anticoagulation   3. Hypertensive heart disease with chronic diastolic congestive heart failure ([place])   4. Mixed hyperlipidemia   5. Thrombocytopenia ([place])    PLAN:    In order of problems listed above:  1. Clinically his greatest problem is gait dysfunction and diffuse weakness.  His atrial fibrillation is relatively slow and he will stop his beta-blocker from the safety perspective and continue anticoagulation 2. Stable BP at target he has no volume overload continue his current loop diuretic 3. Continue statin lipids at target 4. Stable mild thrombocytopenia   Next appointment: 6 months   Medication Adjustments/Labs and Tests Ordered: Current medicines are reviewed at length with the patient today.  Concerns regarding medicines are outlined above.  Orders Placed This Encounter  Procedures  . EKG 12-Lead   No orders of the defined types were placed in this encounter.   Chief Complaint  Patient presents with  . Follow-up  . Atrial Fibrillation    History of Present Illness:    Eric [name] is a 87 y.o. male with a hx of chronic longstanding atrial fibrillation with TIA on warfarin and subtherapeutic INR nonanginal chest pain hypertension and COPD echocardiogram performed March 2021 shows EF of 60 to 65% moderate left and right atrial enlargement and a myocardial perfusion study January 2021 showed normal perfusion EF 67%.  He was last seen July 13, 2020 grieving after the death of his son and motorcycle accident.. Compliance with diet, lifestyle and medications: Yes  He is struggling and wife's daughter-in-law used to repair medications no longer does. He has had physical therapy but is still very  unstable and uses a walker He is diffusely weak. He is not having chest pain shortness of breath edema palpitation or syncope. Today is atrial fibrillation is relatively slow 61 bpm I think from a safety perspective we should stop his beta-blocker.  Recent labs 01/17/2021: Cholesterol 87 LDL 53 triglycerides 47 non-HDL cholesterol 70 sodium 141 potassium 4.4 creatinine 1.02 GFR 71 cc hemoglobin 11.8 mild thrombocytopenia that is chronic 110,000. Past Medical History:  Diagnosis Date  . Abnormal liver enzymes 11/21/2017  . Anemia of unknown etiology 11/17/2017  . Atrial fibrillation ([place])   . Benign paroxysmal positional vertigo due to bilateral vestibular disorder 09/20/2019  . Blood transfusion without reported diagnosis   . C. difficile colitis 11/17/2017  . CAD (coronary artery disease)   . CEREBROVASCULAR ACCIDENT, HX OF 07/21/2007   Qualifier: Diagnosis of  By: Rogue Bussing CMA, Maryann Alar    . Chest pain in adult 07/28/2011  . CHEST PAIN UNSPECIFIED 03/20/2010   Qualifier: Diagnosis of  By: Burnice Logan  MD, Doretha Sou   . CHF (congestive heart failure) ([place])   . Chronic anticoagulation 06/24/2018  . Chronic atrial fibrillation 07/23/2007   Qualifier: Diagnosis of  By: Burnice Logan  MD, Doretha Sou   . Chronic obstructive pulmonary disease ([place]) 07/01/2016  . CKD (chronic kidney disease) stage 3, GFR 30-59 ml/min (HCC) 11/21/2017  . Congestive heart failure ([place]) 05/11/2010   Qualifier: Diagnosis of  By: Burnice Logan  MD, Doretha Sou   . Coronary artery disease involving native coronary artery of native heart without angina pectoris 10/23/2007     -  Left ventricle: The cavity size was normal. Systolic function was     normal. The estimated ejection fraction was in the range of 55% to     60%. Wall motion was normal; there were no regional wall motion     abnormalities.   - Left atrium: The atrium was mildly to moderately dilated.   - Right ventricle: The cavity size was mildly dilated.   - Right atrium: The  atrium was mildly to moderately  . Elevated PSA 12/07/2012   Noted December 2013. Discussed with patient who does not desire further evaluation   . Essential hypertension 07/21/2007   Overview:  Overview:  Qualifier: Diagnosis of  By: Rogue Bussing CMA, Jacqualynn   Qualifier: Diagnosis of  By: Sherren Mocha MD, Jory Ee GERD 07/21/2007   Qualifier: Diagnosis of  By: Rogue Bussing CMA, Maryann Alar   Qualifier: Diagnosis of  By: Sherren Mocha MD, Jory Ee GERD (gastroesophageal reflux disease)   . GI bleed   . Hemoptysis 11/17/2017  . Herpes zoster without complication 96/10/9526  . Hyperlipidemia   . Hypertension   . Hypertensive heart disease 07/21/2007   Qualifier: Diagnosis of  By: Rogue Bussing CMA, Maryann Alar   Qualifier: Diagnosis of  By: Sherren Mocha MD, Jory Ee HYPOTENSION, ORTHOSTATIC 05/15/2010   Qualifier: Diagnosis of  By: Burnice Logan  MD, Doretha Sou   . Mixed hyperlipidemia 07/01/2016  . Moderate major depression ([place]) 03/15/2019  . Multiple pulmonary nodules 11/12/2016   Overview:  Seen on ct of chest 11/15/16. 50mm nodules in right major fissure and 35mm at minor fissure right lung.  52mm  nodule at left major fissure.   . Non-seasonal allergic rhinitis 09/06/2019  . NSAID-induced gastric ulcer   . Postherpetic neuralgia 02/04/2019  . SOB (shortness of breath) 12/10/2016  . Status post total left knee replacement 09/14/2015  . Thrombocytopenia ([place]) 12/25/2018  . Trigeminal neuralgia   . Type 2 diabetes mellitus with stage 3 chronic kidney disease, without long-term current use of insulin ([place]) 12/10/2017  . Unexplained weight loss 11/17/2017    Past Surgical History:  Procedure Laterality Date  . CHOLECYSTECTOMY    . ESOPHAGOGASTRODUODENOSCOPY  2011  . FLEXIBLE SIGMOIDOSCOPY N/A 06/04/2013   Procedure: FLEXIBLE SIGMOIDOSCOPY;  Surgeon: Winfield Cunas., MD;  Location: [facility]OSCOPY;  Service: Endoscopy;  Laterality: N/A;  . HIP SURGERY  August 2008   Right total hip replacement  . SHOULDER SURGERY      Left  . TOTAL KNEE ARTHROPLASTY  June 2009   Left    Current Medications: Current Meds  Medication Sig  . albuterol (PROAIR HFA) 108 (90 BASE) MCG/ACT inhaler Inhale 2 puffs into the lungs every 6 (six) hours as needed for wheezing or shortness of breath.  Marland Kitchen albuterol (PROVENTIL) (2.5 MG/3ML) 0.083% nebulizer solution Take 2.5 mg by nebulization every 6 (six) hours as needed for wheezing or shortness of breath.  Marland Kitchen apixaban (ELIQUIS) 5 MG TABS tablet TAKE 1 TABLET(5 MG) BY MOUTH TWICE DAILY  . aspirin EC 81 MG tablet Take 1 tablet (81 mg total) by mouth daily.  Marland Kitchen atorvastatin (LIPITOR) 40 MG tablet TAKE 1 TABLET AT BEDTIME  . bethanechol (URECHOLINE) 25 MG tablet Take 1 tablet by mouth 2 (two) times daily.  . bimatoprost (LUMIGAN) 0.01 % SOLN Place 1 drop into both eyes at bedtime. Both eyes at hour of sleep  . bisacodyl (DULCOLAX) 5 MG EC tablet Take 1 tablet by mouth as needed for mild constipation or moderate  constipation.  . Blood Glucose Monitoring Suppl (GLUCOCOM BLOOD GLUCOSE MONITOR) DEVI Check blood sugar once a day  . brimonidine (ALPHAGAN) 0.2 % ophthalmic solution INSTILL ONE DROP IN BOTH EYES TWICE A DAY  . Cholecalciferol (VITAMIN D3) 25 MCG (1000 UT) CAPS Take 1 capsule by mouth daily.  Marland Kitchen desonide (DESOWEN) 0.05 % cream Apply topically daily.  Marland Kitchen escitalopram (LEXAPRO) 5 MG tablet Take 1 tablet by mouth daily.  . fluticasone (FLONASE) 50 MCG/ACT nasal spray Place 1 spray into both nostrils daily.  . furosemide (LASIX) 40 MG tablet Take 40 mg by mouth.  . gabapentin (NEURONTIN) 100 MG capsule Take 200 mg by mouth 2 (two) times daily.  Marland Kitchen latanoprost (XALATAN) 0.005 % ophthalmic solution Apply 1 drop to eye at bedtime.  . nitroGLYCERIN (NITROSTAT) 0.4 MG SL tablet Place 0.4 mg under the tongue every 5 (five) minutes as needed for chest pain.  Marland Kitchen omeprazole (PRILOSEC) 20 MG capsule Take 20 mg by mouth daily.  . tamsulosin (FLOMAX) 0.4 MG CAPS capsule Take 0.4 mg by mouth daily.    . TRUE METRIX BLOOD GLUCOSE TEST test strip   . TRUEplus Lancets 33G MISC   . vitamin C (ASCORBIC ACID) 500 MG tablet Take 1,000 mg by mouth daily.   . [DISCONTINUED] metoprolol succinate (TOPROL-XL) 25 MG 24 hr tablet TAKE 1 TABLET(25 MG) BY MOUTH DAILY WITH OR IMMEDIATELY FOLLOWING A MEAL (Patient taking differently: 12.5 mg daily.)     Allergies:   Enoxaparin sodium [enoxaparin sodium], Codeine, Oxycodone hcl, Simvastatin, Sulfa antibiotics, Sulfamethoxazole, Vicodin [hydrocodone-acetaminophen], Gemfibrozil, Other, Sulfonamide derivatives, and Tramadol   Social History   Socioeconomic History  . Marital status: Married    Spouse name: Not on file  . Number of children: Not on file  . Years of education: Not on file  . Highest education level: Not on file  Occupational History  . Not on file  Tobacco Use  . Smoking status: Former Smoker    Quit date: 09/23/1958    Years since quitting: 62.4  . Smokeless tobacco: Former Systems developer  . Tobacco comment: quit smoking1980-quit chewing 6 months  Vaping Use  . Vaping Use: Never used  Substance and Sexual Activity  . Alcohol use: No  . Drug use: No  . Sexual activity: Not on file  Other Topics Concern  . Not on file  Social History Narrative  . Not on file   Social Determinants of Health   Financial Resource Strain: Not on file  Food Insecurity: Not on file  Transportation Needs: Not on file  Physical Activity: Not on file  Stress: Not on file  Social Connections: Not on file     Family History: The patient's family history includes Alzheimer's disease in his mother; Diabetes in his sister; Heart attack in his father. ROS:   Please see the history of present illness.    All other systems reviewed and are negative.  EKGs/Labs/Other Studies Reviewed:    The following studies were reviewed today:  EKG:  EKG ordered today and personally reviewed.  The ekg ordered today demonstrates atrial fibrillation 69 bpm  Recent  Labs: 06/14/2020: BUN 14; Creatinine, Ser 0.85; NT-Pro BNP 2,415; Potassium 3.7; Sodium 144  Recent Lipid Panel    Component Value Date/Time   CHOL 88 (L) 12/08/2019 1343   TRIG 34 12/08/2019 1343   HDL 39 (L) 12/08/2019 1343   CHOLHDL 2.3 12/08/2019 1343   CHOLHDL 4 09/03/2012 0804   VLDL 25.2 09/03/2012 0804   LDLCALC  39 12/08/2019 1343    Physical Exam:    VS:  BP (!) 120/58 (BP Location: Right Arm, Patient Position: Sitting, Cuff Size: Normal)   Pulse 62   Ht 6\' 2"  (1.88 m)   Wt 209 lb (94.8 kg)   SpO2 94%   BMI 26.83 kg/m     Wt Readings from Last 3 Encounters:  02/14/21 209 lb (94.8 kg)  06/14/20 213 lb (96.6 kg)  03/14/20 208 lb (94.3 kg)     GEN: He looks quite fraill in no acute distress HEENT: Normal NECK: No JVD; No carotid bruits LYMPHATICS: No lymphadenopathy CARDIAC: Irregular rhythm variable first heart sound no murmurs, rubs, gallops RESPIRATORY:  Clear to auscultation without rales, wheezing or rhonchi  ABDOMEN: Soft, non-tender, non-distended MUSCULOSKELETAL:  No edema; No deformity  SKIN: Warm and dry NEUROLOGIC:  Alert and oriented x 3 PSYCHIATRIC:  Normal affect    Signed, Shirlee More, MD  02/14/2021 3:36 PM    [facility]

## 2021-02-14 NOTE — Patient Instructions (Signed)
Medication Instructions:  Your physician has recommended you make the following change in your medication:  STOP: Metoprolol  *If you need a refill on your cardiac medications before your next appointment, please call your pharmacy*   Lab Work: None If you have labs (blood work) drawn today and your tests are completely normal, you will receive your results only by: Marland Kitchen MyChart Message (if you have MyChart) OR . A paper copy in the mail If you have any lab test that is abnormal or we need to change your treatment, we will call you to review the results.   Testing/Procedures: None   Follow-Up: At [facility], you and your health needs are our priority.  As part of our continuing mission to provide you with exceptional heart care, we have created designated Provider Care Teams.  These Care Teams include your primary Cardiologist (physician) and Advanced Practice Providers (APPs -  Physician Assistants and Nurse Practitioners) who all work together to provide you with the care you need, when you need it.  We recommend signing up for the patient portal called "MyChart".  Sign up information is provided on this After Visit Summary.  MyChart is used to connect with patients for Virtual Visits (Telemedicine).  Patients are able to view lab/test results, encounter notes, upcoming appointments, etc.  Non-urgent messages can be sent to your provider as well.   To learn more about what you can do with MyChart, go to [email or web address].    Your next appointment:   6 month(s)  The format for your next appointment:   In Person  Provider:   Shirlee More, MD   Other Instructions

## 2021-02-15 ENCOUNTER — Telehealth: Payer: Self-pay | Admitting: Cardiology

## 2021-02-15 NOTE — Telephone Encounter (Signed)
    Pt is calling, he said yesterday Dr. Bettina Gavia asked him to discontinued one of hie medication, he wanted to know what is the med so he can take his morning meds today. He asked if someone can call him as soon as possible

## 2021-02-15 NOTE — Telephone Encounter (Signed)
Pt advised to stop Metoprolol. Pt verbalized understanding and had no additional questions

## 2021-02-23 ENCOUNTER — Other Ambulatory Visit: Payer: Self-pay | Admitting: Cardiology

## 2021-04-05 ENCOUNTER — Ambulatory Visit (INDEPENDENT_AMBULATORY_CARE_PROVIDER_SITE_OTHER): Payer: Medicare Other | Admitting: Podiatry

## 2021-04-05 ENCOUNTER — Other Ambulatory Visit: Payer: Self-pay

## 2021-04-05 ENCOUNTER — Encounter: Payer: Self-pay | Admitting: Podiatry

## 2021-04-05 DIAGNOSIS — Z96659 Presence of unspecified artificial knee joint: Secondary | ICD-10-CM | POA: Insufficient documentation

## 2021-04-05 DIAGNOSIS — T8484XA Pain due to internal orthopedic prosthetic devices, implants and grafts, initial encounter: Secondary | ICD-10-CM | POA: Insufficient documentation

## 2021-04-05 DIAGNOSIS — D689 Coagulation defect, unspecified: Secondary | ICD-10-CM | POA: Diagnosis not present

## 2021-04-05 DIAGNOSIS — B351 Tinea unguium: Secondary | ICD-10-CM | POA: Diagnosis not present

## 2021-04-05 DIAGNOSIS — H401132 Primary open-angle glaucoma, bilateral, moderate stage: Secondary | ICD-10-CM | POA: Insufficient documentation

## 2021-04-05 DIAGNOSIS — F411 Generalized anxiety disorder: Secondary | ICD-10-CM | POA: Insufficient documentation

## 2021-04-05 DIAGNOSIS — M79676 Pain in unspecified toe(s): Secondary | ICD-10-CM | POA: Diagnosis not present

## 2021-04-05 DIAGNOSIS — L719 Rosacea, unspecified: Secondary | ICD-10-CM | POA: Insufficient documentation

## 2021-04-05 NOTE — Progress Notes (Signed)
  Subjective:  Patient ID: Eric [name], male    DOB: 1931-09-02,  MRN: [id number]  Chief Complaint  Patient presents with   Nail Problem    Trim nails    87 y.o. male presents with the above complaint. History confirmed with patient.   Objective:  Physical Exam: warm, good capillary refill, nail exam onychomycosis of the toenails, no trophic changes or ulcerative lesions. DP pulses palpable, PT pulses palpable, and protective sensation intact   No images are attached to the encounter.  Assessment:   1. Pain due to onychomycosis of toenail   2. Coagulation defect [facility])      Plan:  Patient was evaluated and treated and all questions answered.  Onychomycosis and Coagulation Defect -Nails palliatively debrided secondary to pain  Procedure: Nail Debridement Type of Debridement: manual, sharp debridement. Instrumentation: Nail nipper, rotary burr. Number of Nails: 10  Return in about 3 months (around 07/06/2021) for Diabetic Foot Care.

## 2021-05-25 ENCOUNTER — Telehealth: Payer: Self-pay | Admitting: Cardiology

## 2021-05-25 NOTE — Telephone Encounter (Signed)
Spoke to the patient just now and let him know that I do have these samples ready for him to pick up. He states that he is on the way to get them now.   Encouraged patient to call back with any questions or concerns.

## 2021-05-25 NOTE — Telephone Encounter (Signed)
Patient calling the office for samples of medication:   1.  What medication and dosage are you requesting samples for? ELIQUIS 5 MG TABS tablet   2.  Are you currently out of this medication? Yes    

## 2021-07-19 ENCOUNTER — Ambulatory Visit: Payer: Medicare Other | Admitting: Podiatry

## 2021-07-31 ENCOUNTER — Other Ambulatory Visit: Payer: Self-pay | Admitting: Cardiology

## 2021-07-31 NOTE — Telephone Encounter (Signed)
Prescription refill request for Eliquis received. Indication: Afib  Last office visit:02/14/21 [facility])  Scr:0.95 (07/02/21)  Age: 87 Weight: 94.8kg  Appropriate dose and refill sent to requested pharmacy.

## 2021-10-02 ENCOUNTER — Ambulatory Visit: Payer: Medicare Other | Admitting: Cardiology

## 2021-10-02 ENCOUNTER — Encounter: Payer: Self-pay | Admitting: Cardiology

## 2021-10-02 ENCOUNTER — Other Ambulatory Visit: Payer: Self-pay

## 2021-10-02 VITALS — BP 128/64 | HR 78 | Ht 74.0 in | Wt 218.6 lb

## 2021-10-02 DIAGNOSIS — I110 Hypertensive heart disease with heart failure: Secondary | ICD-10-CM | POA: Diagnosis not present

## 2021-10-02 DIAGNOSIS — D696 Thrombocytopenia, unspecified: Secondary | ICD-10-CM

## 2021-10-02 DIAGNOSIS — Z7901 Long term (current) use of anticoagulants: Secondary | ICD-10-CM

## 2021-10-02 DIAGNOSIS — I482 Chronic atrial fibrillation, unspecified: Secondary | ICD-10-CM

## 2021-10-02 DIAGNOSIS — E782 Mixed hyperlipidemia: Secondary | ICD-10-CM

## 2021-10-02 DIAGNOSIS — I5032 Chronic diastolic (congestive) heart failure: Secondary | ICD-10-CM

## 2021-10-02 MED ORDER — APIXABAN 5 MG PO TABS: ORAL_TABLET | ORAL | 1 refills | Status: DC

## 2021-10-02 NOTE — Progress Notes (Signed)
Cardiology Office Note:    Date:  10/02/2021   ID:  Eric [name], DOB [birth year], MRN [id number]  PCP:  Raina Mina., MD  Cardiologist:  Shirlee More, MD    Referring MD: Raina Mina., MD    ASSESSMENT:    1. Chronic atrial fibrillation ([place])   2. Chronic anticoagulation   3. Thrombocytopenia ([place])   4. Hypertensive heart disease with chronic diastolic congestive heart failure ([place])   5. Mixed hyperlipidemia    PLAN:    In order of problems listed above:  Overall he has done well he is rate controlled atrial fibrillation with his beta-blocker asymptomatic and is on combined antiplatelet anticoagulant therapy without bleeding complication.  Check labs today including a CBC visit previous mild anemia and platelet count with chronic mild thrombocytopenia Stable he has a small amount of edema likely related to his gabapentin I offered higher dose of diuretic and he declines continue his current loop diuretic On a statin check CMP lipids continue atorvastatin I reinforced the need for a walker at times with his homeless fortunately he has a grandson living  Next appointment: 6 months   Medication Adjustments/Labs and Tests Ordered: Current medicines are reviewed at length with the patient today.  Concerns regarding medicines are outlined above.  Orders Placed This Encounter  Procedures   CBC   Comprehensive Metabolic Panel (CMET)   Lipid Profile   Pro b natriuretic peptide (BNP)[id number])   Meds ordered this encounter  Medications   apixaban (ELIQUIS) 5 MG TABS tablet    Sig: TAKE 1 TABLET(5 MG) BY MOUTH TWICE DAILY    Dispense:  180 tablet    Refill:  1    Chief Complaint  Patient presents with   Follow-up   Atrial Fibrillation   Anticoagulation   Congestive Heart Failure    History of Present Illness:    Eric [name] is a 88 y.o. male with a hx of permanent atrial fibrillation with TIA on warfarin and has subtherapeutic INR  nonanginal chest pain COPD and hypertension last seen 02/14/2021 he also has stable mild thrombocytopenia.  Compliance with diet, lifestyle and medications: Yes  Eric [name] unfortunately continues to have falls at home but has not been injured. He has chronic edema of the lower extremities but is not having orthopnea shortness of breath No palpitation or syncope no chest pain Is on combined aspirin and Eliquis and has had no bleeding complication No muscle pain or weakness with his statin Past Medical History:  Diagnosis Date   Abnormal liver enzymes 11/21/2017   Anemia of unknown etiology 11/17/2017   Atrial fibrillation (HCC)    Benign paroxysmal positional vertigo due to bilateral vestibular disorder 09/20/2019   Blood transfusion without reported diagnosis    C. difficile colitis 11/17/2017   CAD (coronary artery disease)    CEREBROVASCULAR ACCIDENT, HX OF 07/21/2007   Qualifier: Diagnosis of  By: Rogue Bussing CMA, Jacqualynn     Chest pain in adult 07/28/2011   CHEST PAIN UNSPECIFIED 03/20/2010   Qualifier: Diagnosis of  By: Burnice Logan  MD, Doretha Sou    CHF (congestive heart failure) ([place])    Chronic anticoagulation 06/24/2018   Chronic atrial fibrillation 07/23/2007   Qualifier: Diagnosis of  By: Burnice Logan  MD, Doretha Sou    Chronic obstructive pulmonary disease ([place]) 07/01/2016   CKD (chronic kidney disease) stage 3, GFR 30-59 ml/min (HCC) 11/21/2017   Congestive heart failure ([place]) 05/11/2010   Qualifier: Diagnosis of  By: Burnice Logan  MD, Doretha Sou    Coronary artery disease involving native coronary artery of native heart without angina pectoris 10/23/2007     - Left ventricle: The cavity size was normal. Systolic function was     normal. The estimated ejection fraction was in the range of 55% to     60%. Wall motion was normal; there were no regional wall motion     abnormalities.   - Left atrium: The atrium was mildly to moderately dilated.   - Right ventricle: The cavity size was mildly  dilated.   - Right atrium: The atrium was mildly to moderately   Elevated PSA 12/07/2012   Noted December 2013. Discussed with patient who does not desire further evaluation    Essential hypertension 07/21/2007   Overview:  Overview:  Qualifier: Diagnosis of  By: Rogue Bussing CMA, Jacqualynn   Qualifier: Diagnosis of  By: Sherren Mocha MD, Jory Ee   GERD 07/21/2007   Qualifier: Diagnosis of  By: Rogue Bussing CMA, Jacqualynn   Qualifier: Diagnosis of  By: Sherren Mocha MD, Jory Ee    GERD (gastroesophageal reflux disease)    GI bleed    Hemoptysis 11/17/2017   Herpes zoster without complication 07/01/3234   Hyperlipidemia    Hypertension    Hypertensive heart disease 07/21/2007   Qualifier: Diagnosis of  By: Rogue Bussing CMA, Maryann Alar   Qualifier: Diagnosis of  By: Sherren Mocha MD, Jory Ee    HYPOTENSION, ORTHOSTATIC 05/15/2010   Qualifier: Diagnosis of  By: Burnice Logan  MD, Doretha Sou    Mixed hyperlipidemia 07/01/2016   Moderate major depression ([place]) 03/15/2019   Multiple pulmonary nodules 11/12/2016   Overview:  Seen on ct of chest 11/15/16. 17mm nodules in right major fissure and 43mm at minor fissure right lung.  74mm  nodule at left major fissure.    Non-seasonal allergic rhinitis 09/06/2019   NSAID-induced gastric ulcer    Postherpetic neuralgia 02/04/2019   SOB (shortness of breath) 12/10/2016   Status post total left knee replacement 09/14/2015   Thrombocytopenia ([place]) 12/25/2018   Trigeminal neuralgia    Type 2 diabetes mellitus with stage 3 chronic kidney disease, without long-term current use of insulin ([place]) 12/10/2017   Unexplained weight loss 11/17/2017    Past Surgical History:  Procedure Laterality Date   CHOLECYSTECTOMY     ESOPHAGOGASTRODUODENOSCOPY  2011   FLEXIBLE SIGMOIDOSCOPY N/A 06/04/2013   Procedure: FLEXIBLE SIGMOIDOSCOPY;  Surgeon: Winfield Cunas., MD;  Location: [facility] ENDOSCOPY;  Service: Endoscopy;  Laterality: N/A;   HIP SURGERY  August 2008   Right total hip replacement   SHOULDER SURGERY      Left   TOTAL KNEE ARTHROPLASTY  June 2009   Left    Current Medications: Current Meds  Medication Sig   acetaminophen (TYLENOL) 500 MG tablet Take by mouth every 6 (six) hours as needed for mild pain.   albuterol (PROAIR HFA) 108 (90 BASE) MCG/ACT inhaler Inhale 2 puffs into the lungs every 6 (six) hours as needed for wheezing or shortness of breath.   albuterol (PROVENTIL) (2.5 MG/3ML) 0.083% nebulizer solution Take 2.5 mg by nebulization every 6 (six) hours as needed for wheezing or shortness of breath.   atorvastatin (LIPITOR) 40 MG tablet TAKE 1 TABLET AT BEDTIME   bethanechol (URECHOLINE) 25 MG tablet Take 1 tablet by mouth 2 (two) times daily.   bimatoprost (LUMIGAN) 0.01 % SOLN Place 1 drop into both eyes at bedtime. Both eyes at hour of sleep   bisacodyl (DULCOLAX) 5  MG EC tablet Take 1 tablet by mouth as needed for mild constipation or moderate constipation.   Blood Glucose Monitoring Suppl (GLUCOCOM BLOOD GLUCOSE MONITOR) DEVI Check blood sugar once a day   brimonidine (ALPHAGAN) 0.2 % ophthalmic solution INSTILL ONE DROP IN BOTH EYES TWICE A DAY   Cholecalciferol (VITAMIN D3) 25 MCG (1000 UT) CAPS Take 1 capsule by mouth daily.   desonide (DESOWEN) 0.05 % cream Apply topically daily.   escitalopram (LEXAPRO) 5 MG tablet Take 5 mg by mouth daily.   fluticasone (FLONASE) 50 MCG/ACT nasal spray Place 1 spray into both nostrils daily.   furosemide (LASIX) 40 MG tablet Take 40 mg by mouth.   gabapentin (NEURONTIN) 100 MG capsule Take 2 capsules by mouth 2 (two) times daily.   latanoprost (XALATAN) 0.005 % ophthalmic solution Apply 1 drop to eye at bedtime.   metoprolol succinate (TOPROL-XL) 25 MG 24 hr tablet Take 0.5 tablets by mouth daily.   nitroGLYCERIN (NITROSTAT) 0.4 MG SL tablet Place 0.4 mg under the tongue every 5 (five) minutes as needed for chest pain.   omeprazole (PRILOSEC) 20 MG capsule Take 1 capsule by mouth daily.   polyvinyl alcohol (LIQUIFILM TEARS) 1.4 %  ophthalmic solution Place 1 drop into both eyes as needed for dry eyes.   tamsulosin (FLOMAX) 0.4 MG CAPS capsule Take 0.4 mg by mouth daily.    TRUE METRIX BLOOD GLUCOSE TEST test strip    TRUEplus Lancets 33G MISC    vitamin C (ASCORBIC ACID) 500 MG tablet Take 1,000 mg by mouth daily.    [DISCONTINUED] ELIQUIS 5 MG TABS tablet TAKE 1 TABLET(5 MG) BY MOUTH TWICE DAILY   [DISCONTINUED] escitalopram (LEXAPRO) 5 MG tablet Take 1 tablet by mouth daily.     Allergies:   Enoxaparin sodium [enoxaparin sodium], Codeine, Oxycodone hcl, Simvastatin, Sulfa antibiotics, Sulfamethoxazole, Vicodin [hydrocodone-acetaminophen], Gemfibrozil, Other, Sulfonamide derivatives, and Tramadol   Social History   Socioeconomic History   Marital status: Married    Spouse name: Not on file   Number of children: Not on file   Years of education: Not on file   Highest education level: Not on file  Occupational History   Not on file  Tobacco Use   Smoking status: Former    Types: Cigarettes    Quit date: 09/23/1958    Years since quitting: 63.0   Smokeless tobacco: Former   Tobacco comments:    quit smoking1980-quit chewing 6 months  Vaping Use   Vaping Use: Never used  Substance and Sexual Activity   Alcohol use: No   Drug use: No   Sexual activity: Not on file  Other Topics Concern   Not on file  Social History Narrative   Not on file   Social Determinants of Health   Financial Resource Strain: Not on file  Food Insecurity: Not on file  Transportation Needs: Not on file  Physical Activity: Not on file  Stress: Not on file  Social Connections: Not on file     Family History: The patient's family history includes Alzheimer's disease in his mother; Diabetes in his sister; Heart attack in his father. ROS:   Please see the history of present illness.    All other systems reviewed and are negative.  EKGs/Labs/Other Studies Reviewed:    The following studies were reviewed today:  Recent Lipid  Panel    Component Value Date/Time   CHOL 88 (L) 12/08/2019 1343   TRIG 34 12/08/2019 1343   HDL 39 (L)  12/08/2019 1343   CHOLHDL 2.3 12/08/2019 1343   CHOLHDL 4 09/03/2012 0804   VLDL 25.2 09/03/2012 0804   LDLCALC 39 12/08/2019 1343    Physical Exam:    VS:  BP 128/64 (BP Location: Left Arm)    Pulse 78    Ht 6\' 2"  (1.88 m)    Wt 218 lb 9.6 oz (99.2 kg)    SpO2 96%    BMI 28.07 kg/m     Wt Readings from Last 3 Encounters:  10/02/21 218 lb 9.6 oz (99.2 kg)  02/14/21 209 lb (94.8 kg)  06/14/20 213 lb (96.6 kg)     GEN: He appears frail well nourished, well developed in no acute distress HEENT: Normal NECK: No JVD; No carotid bruits LYMPHATICS: No lymphadenopathy CARDIAC: Irregular rate and rhythm RRR, no murmurs, rubs, gallops RESPIRATORY:  Clear to auscultation without rales, wheezing or rhonchi  ABDOMEN: Soft, non-tender, non-distended MUSCULOSKELETAL: 1-2+ bilateral lower extremity edema edema; No deformity  SKIN: Warm and dry NEUROLOGIC:  Alert and oriented x 3 PSYCHIATRIC:  Normal affect    Signed, Shirlee More, MD  10/02/2021 3:40 PM    [facility]

## 2021-10-02 NOTE — Patient Instructions (Signed)
Medication Instructions:  Your physician recommends that you continue on your current medications as directed. Please refer to the Current Medication list given to you today.  *If you need a refill on your cardiac medications before your next appointment, please call your pharmacy*   Lab Work: Your physician recommends that you return for lab work in: Labs today: CBC, CMP,Lipid,Pro BNP If you have labs (blood work) drawn today and your tests are completely normal, you will receive your results only by: [place] (if you have MyChart) OR A paper copy in the mail If you have any lab test that is abnormal or we need to change your treatment, we will call you to review the results.   Testing/Procedures:    Follow-Up: At [facility], you and your health needs are our priority.  As part of our continuing mission to provide you with exceptional heart care, we have created designated Provider Care Teams.  These Care Teams include your primary Cardiologist (physician) and Advanced Practice Providers (APPs -  Physician Assistants and Nurse Practitioners) who all work together to provide you with the care you need, when you need it.  We recommend signing up for the patient portal called "MyChart".  Sign up information is provided on this After Visit Summary.  MyChart is used to connect with patients for Virtual Visits (Telemedicine).  Patients are able to view lab/test results, encounter notes, upcoming appointments, etc.  Non-urgent messages can be sent to your provider as well.   To learn more about what you can do with MyChart, go to [email or web address].    Your next appointment:   6 month(s)  The format for your next appointment:   In Person  Provider:   Shirlee More, MD    Other Instructions

## 2021-10-03 LAB — CBC
Hematocrit: 36.9 % — ABNORMAL LOW (ref 37.5–51.0)
Hemoglobin: 12.1 g/dL — ABNORMAL LOW (ref 13.0–17.7)
MCH: 30.1 pg (ref 26.6–33.0)
MCHC: 32.8 g/dL (ref 31.5–35.7)
MCV: 92 fL (ref 79–97)
Platelets: 170 10*3/uL (ref 150–450)
RBC: 4.02 x10E6/uL — ABNORMAL LOW (ref 4.14–5.80)
RDW: 14 % (ref 11.6–15.4)
WBC: 7.4 10*3/uL (ref 3.4–10.8)

## 2021-10-03 LAB — COMPREHENSIVE METABOLIC PANEL
ALT: 12 IU/L (ref 0–44)
AST: 28 IU/L (ref 0–40)
Albumin/Globulin Ratio: 2.5 — ABNORMAL HIGH (ref 1.2–2.2)
Albumin: 4.2 g/dL (ref 3.6–4.6)
Alkaline Phosphatase: 110 IU/L (ref 44–121)
BUN/Creatinine Ratio: 15 (ref 10–24)
BUN: 15 mg/dL (ref 8–27)
Bilirubin Total: 0.6 mg/dL (ref 0.0–1.2)
CO2: 27 mmol/L (ref 20–29)
Calcium: 8.7 mg/dL (ref 8.6–10.2)
Chloride: 104 mmol/L (ref 96–106)
Creatinine, Ser: 0.97 mg/dL (ref 0.76–1.27)
Globulin, Total: 1.7 g/dL (ref 1.5–4.5)
Glucose: 103 mg/dL — ABNORMAL HIGH (ref 70–99)
Potassium: 4.7 mmol/L (ref 3.5–5.2)
Sodium: 142 mmol/L (ref 134–144)
Total Protein: 5.9 g/dL — ABNORMAL LOW (ref 6.0–8.5)
eGFR: 75 mL/min/{1.73_m2} (ref >59)

## 2021-10-03 LAB — LIPID PANEL
Chol/HDL Ratio: 2.6 ratio (ref 0.0–5.0)
Cholesterol, Total: 104 mg/dL (ref 100–199)
HDL: 40 mg/dL (ref >39)
LDL Chol Calc (NIH): 53 mg/dL (ref 0–99)
Triglycerides: 43 mg/dL (ref 0–149)
VLDL Cholesterol Cal: 11 mg/dL (ref 5–40)

## 2021-10-03 LAB — PRO B NATRIURETIC PEPTIDE: NT-Pro BNP: 1822 pg/mL — ABNORMAL HIGH (ref 0–486)

## 2021-10-04 ENCOUNTER — Telehealth: Payer: Self-pay

## 2021-10-04 NOTE — Telephone Encounter (Signed)
Spoke with patient regarding results and recommendation.  Patient verbalizes understanding and is agreeable to plan of care. Advised patient to call back with any issues or concerns.  

## 2021-10-04 NOTE — Telephone Encounter (Signed)
-----   Message from Richardo Priest, MD sent at 10/03/2021 12:41 PM EST ----- No changes

## 2021-12-29 DIAGNOSIS — S7001XA Contusion of right hip, initial encounter: Secondary | ICD-10-CM | POA: Insufficient documentation

## 2021-12-29 DIAGNOSIS — W19XXXS Unspecified fall, sequela: Secondary | ICD-10-CM | POA: Insufficient documentation

## 2022-03-07 ENCOUNTER — Ambulatory Visit: Payer: Medicare Other | Admitting: Podiatry

## 2022-03-12 ENCOUNTER — Telehealth: Payer: Self-pay | Admitting: Cardiology

## 2022-03-12 NOTE — Telephone Encounter (Signed)
Pt c/o medication issue:  1. Name of Medication: apixaban (ELIQUIS) 5 MG TABS tablet  2. How are you currently taking this medication (dosage and times per day)? As prescribed   3. Are you having a reaction (difficulty breathing--STAT)?   4. What is your medication issue? Pt is currently in hospital due to a fall and the PCP, Dr. Bea Graff, was wanting to know if they could stop taking this medication and switch to an aspirin instead due to the increased fall risk associated with this med. Requesting call back.

## 2022-03-14 ENCOUNTER — Encounter: Payer: Self-pay | Admitting: Podiatry

## 2022-03-14 ENCOUNTER — Ambulatory Visit (INDEPENDENT_AMBULATORY_CARE_PROVIDER_SITE_OTHER): Payer: Medicare Other | Admitting: Podiatry

## 2022-03-14 DIAGNOSIS — M79676 Pain in unspecified toe(s): Secondary | ICD-10-CM

## 2022-03-14 DIAGNOSIS — R1312 Dysphagia, oropharyngeal phase: Secondary | ICD-10-CM | POA: Insufficient documentation

## 2022-03-14 DIAGNOSIS — E1122 Type 2 diabetes mellitus with diabetic chronic kidney disease: Secondary | ICD-10-CM | POA: Diagnosis not present

## 2022-03-14 DIAGNOSIS — B351 Tinea unguium: Secondary | ICD-10-CM

## 2022-03-14 DIAGNOSIS — N183 Chronic kidney disease, stage 3 unspecified: Secondary | ICD-10-CM | POA: Diagnosis not present

## 2022-03-14 NOTE — Telephone Encounter (Signed)
Spoke to [place] at Dr. Willette Pa office to advise of Dr. Oren Binet reply regarding pt's Eliquis.

## 2022-03-24 NOTE — Progress Notes (Signed)
  Subjective:  Patient ID: Eric [name], male    DOB: Feb 15, 1931,  MRN: [id number]  Eric [name] presents to clinic today for at risk foot care. Pt has h/o NIDDM with chronic kidney disease and painful elongated mycotic toenails 1-5 bilaterally which are tender when wearing enclosed shoe gear. Pain is relieved with periodic professional debridement.  New problem(s): None.   PCP is Mayer Camel, NP , and last visit was March 07, 2022.  Allergies  Allergen Reactions   Enoxaparin Sodium [Enoxaparin Sodium] Anaphylaxis and Other (See Comments)    angioedema   Codeine Other (See Comments)    hallucinations    Oxycodone Hcl Other (See Comments)    hallucinations   Simvastatin Other (See Comments)   Sulfa Antibiotics Other (See Comments)    Unknown.   Sulfamethoxazole Other (See Comments)    Unknown.   Vicodin [Hydrocodone-Acetaminophen] Other (See Comments)    hallucinations   Gemfibrozil Rash   Other Rash   Sulfonamide Derivatives Rash   Tramadol Rash    Review of Systems: Negative except as noted in the HPI.  Objective: No changes noted in today's physical examination. Constitutional Eric [name] is a pleasant 88 y.o. Caucasian male, in NAD. AAO x 3.   Vascular Capillary fill time to digits <3 seconds b/l lower extremities. Faintly palpable pedal pulses b/l. Pedal hair sparse. Lower extremity skin temperature gradient within normal limits. Varicosities present b/l. No cyanosis or clubbing noted.  Neurologic Normal speech. Oriented to person, place, and time. Protective sensation intact 5/5 intact bilaterally with 10g monofilament b/l.  Dermatologic Pedal skin with normal turgor, texture and tone bilaterally. No open wounds bilaterally. No interdigital macerations bilaterally. Toenails 1-5 b/l elongated, discolored, dystrophic, thickened, crumbly with subungual debris and tenderness to dorsal palpation.  Orthopedic: Normal muscle strength 5/5 to all lower  extremity muscle groups bilaterally. No pain crepitus or joint limitation noted with ROM b/l. No gross bony deformities bilaterally.   Radiographs: None  Assessment/Plan: 1. Pain due to onychomycosis of toenail   2. Type 2 diabetes mellitus with stage 3 chronic kidney disease, without long-term current use of insulin, unspecified whether stage 3a or 3b CKD ([place])      -Patient was evaluated and treated. All patient's and/or POA's questions/concerns answered on today's visit. -Patient to continue soft, supportive shoe gear daily. -Mycotic toenails 1-5 bilaterally were debrided in length and girth with sterile nail nippers and dremel without incident. -Patient/POA to call should there be question/concern in the interim.   Return in about 3 months (around 06/14/2022).  Marzetta Board, DPM

## 2022-04-09 ENCOUNTER — Telehealth: Payer: Self-pay

## 2022-04-09 ENCOUNTER — Telehealth: Payer: Self-pay | Admitting: Cardiology

## 2022-04-09 NOTE — Telephone Encounter (Signed)
Pt c/o swelling: STAT is pt has developed SOB within 24 hours  If swelling, where is the swelling located? Lower legs  How much weight have you gained and in what time span? 3 lbs overnight, she states that he did eat out  Have you gained 3 pounds in a day or 5 pounds in a week? 3lb  Do you have a log of your daily weights (if so, list)?  198today 195yesterday   Are you currently taking a fluid pill? Furosemide, 20 mg, 1x daily  Are you currently SOB? no  Have you traveled recently? No

## 2022-04-09 NOTE — Telephone Encounter (Signed)
Spoke with Pt's daughter in [place] and relayed Dr. Joya Gaskins recommendations. Also called and left message for Marzetta Board at [place] with the reply to the message.

## 2022-04-09 NOTE — Telephone Encounter (Signed)
Left message for [place] and notified pt's spouse and daughter in law of Dr. Oren Binet recommendations.

## 2022-04-12 NOTE — Telephone Encounter (Signed)
LVM for Home Health Nurse to call back

## 2022-04-12 NOTE — Telephone Encounter (Signed)
Was returning your call. Please advise

## 2022-06-20 ENCOUNTER — Ambulatory Visit: Payer: Medicare Other | Admitting: Podiatry

## 2022-11-14 DIAGNOSIS — I480 Paroxysmal atrial fibrillation: Secondary | ICD-10-CM

## 2022-11-14 DIAGNOSIS — N401 Enlarged prostate with lower urinary tract symptoms: Secondary | ICD-10-CM

## 2022-11-14 DIAGNOSIS — R339 Retention of urine, unspecified: Secondary | ICD-10-CM

## 2022-11-14 DIAGNOSIS — I5032 Chronic diastolic (congestive) heart failure: Secondary | ICD-10-CM

## 2022-11-14 DIAGNOSIS — E119 Type 2 diabetes mellitus without complications: Secondary | ICD-10-CM

## 2022-11-20 DIAGNOSIS — R197 Diarrhea, unspecified: Secondary | ICD-10-CM

## 2022-11-20 DIAGNOSIS — R339 Retention of urine, unspecified: Secondary | ICD-10-CM

## 2022-11-22 DIAGNOSIS — R339 Retention of urine, unspecified: Secondary | ICD-10-CM | POA: Diagnosis not present

## 2022-11-22 DIAGNOSIS — D72829 Elevated white blood cell count, unspecified: Secondary | ICD-10-CM

## 2022-11-22 DIAGNOSIS — R197 Diarrhea, unspecified: Secondary | ICD-10-CM | POA: Diagnosis not present

## 2022-11-22 DIAGNOSIS — N1831 Chronic kidney disease, stage 3a: Secondary | ICD-10-CM | POA: Diagnosis not present

## 2022-11-22 DIAGNOSIS — D649 Anemia, unspecified: Secondary | ICD-10-CM | POA: Diagnosis not present

## 2022-11-22 DIAGNOSIS — Z960 Presence of urogenital implants: Secondary | ICD-10-CM

## 2023-01-07 ENCOUNTER — Other Ambulatory Visit: Payer: Self-pay | Admitting: Internal Medicine

## 2023-10-25 DEATH — deceased
